# Patient Record
Sex: Female | Born: 1985
Health system: Southern US, Community
[De-identification: ages and names within clinical notes are randomized; demographics above are authoritative.]

## PROBLEM LIST (undated history)

## (undated) ENCOUNTER — Inpatient Hospital Stay: Payer: Self-pay

## (undated) DIAGNOSIS — D649 Anemia, unspecified: Secondary | ICD-10-CM

## (undated) DIAGNOSIS — R51 Headache: Secondary | ICD-10-CM

## (undated) DIAGNOSIS — R519 Headache, unspecified: Secondary | ICD-10-CM

## (undated) DIAGNOSIS — R569 Unspecified convulsions: Secondary | ICD-10-CM

## (undated) HISTORY — PX: HERNIA REPAIR: SHX51

---
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
## 2007-11-25 ENCOUNTER — Observation Stay: Payer: Self-pay

## 2007-11-27 ENCOUNTER — Inpatient Hospital Stay: Payer: Self-pay

## 2008-05-09 ENCOUNTER — Emergency Department: Payer: Self-pay | Admitting: Emergency Medicine

## 2009-06-09 ENCOUNTER — Emergency Department: Payer: Self-pay | Admitting: Emergency Medicine

## 2010-05-06 ENCOUNTER — Emergency Department: Payer: Self-pay | Admitting: Emergency Medicine

## 2010-12-05 ENCOUNTER — Emergency Department: Payer: Self-pay | Admitting: Emergency Medicine

## 2011-09-30 ENCOUNTER — Emergency Department: Payer: Self-pay | Admitting: Emergency Medicine

## 2011-10-01 LAB — CBC
HCT: 36.2 % (ref 35.0–47.0)
HGB: 12.1 g/dL (ref 12.0–16.0)
MCH: 28.3 pg (ref 26.0–34.0)
MCHC: 33.3 g/dL (ref 32.0–36.0)
MCV: 85 fL (ref 80–100)
Platelet: 251 10*3/uL (ref 150–440)
RBC: 4.27 10*6/uL (ref 3.80–5.20)
RDW: 14.1 % (ref 11.5–14.5)
WBC: 6.3 10*3/uL (ref 3.6–11.0)

## 2011-10-01 LAB — COMPREHENSIVE METABOLIC PANEL
Albumin: 3.9 g/dL (ref 3.4–5.0)
Alkaline Phosphatase: 43 U/L — ABNORMAL LOW (ref 50–136)
Anion Gap: 14 (ref 7–16)
BUN: 11 mg/dL (ref 7–18)
Bilirubin,Total: 0.2 mg/dL (ref 0.2–1.0)
Calcium, Total: 8.6 mg/dL (ref 8.5–10.1)
Chloride: 106 mmol/L (ref 98–107)
Co2: 26 mmol/L (ref 21–32)
Creatinine: 0.77 mg/dL (ref 0.60–1.30)
EGFR (African American): >60
EGFR (Non-African Amer.): >60
Glucose: 102 mg/dL — ABNORMAL HIGH (ref 65–99)
Osmolality: 290 (ref 275–301)
Potassium: 3.9 mmol/L (ref 3.5–5.1)
SGOT(AST): 18 U/L (ref 15–37)
SGPT (ALT): 16 U/L
Sodium: 146 mmol/L — ABNORMAL HIGH (ref 136–145)
Total Protein: 7.4 g/dL (ref 6.4–8.2)

## 2011-10-01 LAB — HCG, QUANTITATIVE, PREGNANCY: Beta Hcg, Quant.: 473 m[IU]/mL — ABNORMAL HIGH

## 2011-10-01 LAB — WET PREP, GENITAL

## 2011-10-02 ENCOUNTER — Emergency Department: Payer: Self-pay | Admitting: Emergency Medicine

## 2011-10-02 LAB — CBC WITH DIFFERENTIAL/PLATELET
Basophil #: 0 10*3/uL (ref 0.0–0.1)
Basophil %: 0.1 %
Eosinophil #: 0 10*3/uL (ref 0.0–0.7)
Eosinophil %: 0 %
HCT: 36.5 % (ref 35.0–47.0)
HGB: 12.1 g/dL (ref 12.0–16.0)
Lymphocyte #: 0.7 10*3/uL — ABNORMAL LOW (ref 1.0–3.6)
Lymphocyte %: 6.5 %
MCH: 28.3 pg (ref 26.0–34.0)
MCHC: 33.1 g/dL (ref 32.0–36.0)
MCV: 85 fL (ref 80–100)
Monocyte #: 0.1 10*3/uL (ref 0.0–0.7)
Monocyte %: 0.8 %
Neutrophil #: 10.2 10*3/uL — ABNORMAL HIGH (ref 1.4–6.5)
Neutrophil %: 92.6 %
Platelet: 252 10*3/uL (ref 150–440)
RBC: 4.27 10*6/uL (ref 3.80–5.20)
RDW: 14.1 % (ref 11.5–14.5)
WBC: 11 10*3/uL (ref 3.6–11.0)

## 2011-10-02 LAB — HCG, QUANTITATIVE, PREGNANCY: Beta Hcg, Quant.: 260 m[IU]/mL — ABNORMAL HIGH

## 2011-10-20 ENCOUNTER — Emergency Department: Payer: Self-pay | Admitting: Emergency Medicine

## 2011-11-23 ENCOUNTER — Emergency Department: Payer: Self-pay | Admitting: Emergency Medicine

## 2011-11-23 LAB — COMPREHENSIVE METABOLIC PANEL
Albumin: 3.7 g/dL (ref 3.4–5.0)
Alkaline Phosphatase: 51 U/L (ref 50–136)
Anion Gap: 10 (ref 7–16)
BUN: 9 mg/dL (ref 7–18)
Bilirubin,Total: 0.3 mg/dL (ref 0.2–1.0)
Calcium, Total: 8.9 mg/dL (ref 8.5–10.1)
Chloride: 105 mmol/L (ref 98–107)
Co2: 27 mmol/L (ref 21–32)
Creatinine: 0.77 mg/dL (ref 0.60–1.30)
EGFR (African American): >60
EGFR (Non-African Amer.): >60
Glucose: 81 mg/dL (ref 65–99)
Osmolality: 281 (ref 275–301)
Potassium: 4.3 mmol/L (ref 3.5–5.1)
SGOT(AST): 17 U/L (ref 15–37)
SGPT (ALT): 13 U/L
Sodium: 142 mmol/L (ref 136–145)
Total Protein: 7.2 g/dL (ref 6.4–8.2)

## 2011-11-23 LAB — CBC WITH DIFFERENTIAL/PLATELET
Basophil #: 0 10*3/uL (ref 0.0–0.1)
Basophil %: 0.5 %
Eosinophil #: 0.2 10*3/uL (ref 0.0–0.7)
Eosinophil %: 3.4 %
HCT: 34.1 % — ABNORMAL LOW (ref 35.0–47.0)
HGB: 11.4 g/dL — ABNORMAL LOW (ref 12.0–16.0)
Lymphocyte #: 2.1 10*3/uL (ref 1.0–3.6)
Lymphocyte %: 33.3 %
MCH: 28.4 pg (ref 26.0–34.0)
MCHC: 33.3 g/dL (ref 32.0–36.0)
MCV: 85 fL (ref 80–100)
Monocyte #: 0.4 10*3/uL (ref 0.0–0.7)
Monocyte %: 6.8 %
Neutrophil #: 3.5 10*3/uL (ref 1.4–6.5)
Neutrophil %: 56 %
Platelet: 252 10*3/uL (ref 150–440)
RBC: 4 10*6/uL (ref 3.80–5.20)
RDW: 14.2 % (ref 11.5–14.5)
WBC: 6.3 10*3/uL (ref 3.6–11.0)

## 2011-11-23 LAB — PREGNANCY, URINE: Pregnancy Test, Urine: NEGATIVE m[IU]/mL

## 2012-01-13 ENCOUNTER — Emergency Department: Payer: Self-pay | Admitting: Emergency Medicine

## 2012-01-22 ENCOUNTER — Emergency Department: Payer: Self-pay | Admitting: Emergency Medicine

## 2012-01-24 ENCOUNTER — Emergency Department: Payer: Self-pay | Admitting: Emergency Medicine

## 2012-04-10 ENCOUNTER — Emergency Department: Payer: Self-pay | Admitting: Emergency Medicine

## 2012-04-10 LAB — COMPREHENSIVE METABOLIC PANEL
Albumin: 3.3 g/dL — ABNORMAL LOW (ref 3.4–5.0)
Alkaline Phosphatase: 44 U/L — ABNORMAL LOW (ref 50–136)
Anion Gap: 7 (ref 7–16)
BUN: 7 mg/dL (ref 7–18)
Bilirubin,Total: 0.2 mg/dL (ref 0.2–1.0)
Calcium, Total: 9 mg/dL (ref 8.5–10.1)
Chloride: 105 mmol/L (ref 98–107)
Co2: 27 mmol/L (ref 21–32)
Creatinine: 0.69 mg/dL (ref 0.60–1.30)
EGFR (African American): >60
EGFR (Non-African Amer.): >60
Glucose: 86 mg/dL (ref 65–99)
Osmolality: 275 (ref 275–301)
Potassium: 3.8 mmol/L (ref 3.5–5.1)
SGOT(AST): 20 U/L (ref 15–37)
SGPT (ALT): 15 U/L
Sodium: 139 mmol/L (ref 136–145)
Total Protein: 6.9 g/dL (ref 6.4–8.2)

## 2012-04-10 LAB — URINALYSIS, COMPLETE
Bilirubin,UR: NEGATIVE
Blood: NEGATIVE
Glucose,UR: NEGATIVE mg/dL (ref 0–75)
Ketone: NEGATIVE
Leukocyte Esterase: NEGATIVE
Nitrite: NEGATIVE
Ph: 6 (ref 4.5–8.0)
Protein: NEGATIVE
RBC,UR: 1 /HPF (ref 0–5)
Specific Gravity: 1.011 (ref 1.003–1.030)
Squamous Epithelial: 4
WBC UR: 3 /HPF (ref 0–5)

## 2012-04-10 LAB — LIPASE, BLOOD: Lipase: 187 U/L (ref 73–393)

## 2012-04-10 LAB — CBC
HCT: 34.6 % — ABNORMAL LOW (ref 35.0–47.0)
HGB: 11.7 g/dL — ABNORMAL LOW (ref 12.0–16.0)
MCH: 28.9 pg (ref 26.0–34.0)
MCHC: 33.9 g/dL (ref 32.0–36.0)
MCV: 86 fL (ref 80–100)
Platelet: 235 10*3/uL (ref 150–440)
RBC: 4.04 10*6/uL (ref 3.80–5.20)
RDW: 14.9 % — ABNORMAL HIGH (ref 11.5–14.5)
WBC: 8.7 10*3/uL (ref 3.6–11.0)

## 2012-04-10 LAB — HCG, QUANTITATIVE, PREGNANCY: Beta Hcg, Quant.: 103697 m[IU]/mL — ABNORMAL HIGH

## 2012-04-10 LAB — WET PREP, GENITAL

## 2012-04-10 LAB — PREGNANCY, URINE: Pregnancy Test, Urine: POSITIVE m[IU]/mL

## 2012-04-12 LAB — URINE CULTURE

## 2012-09-20 ENCOUNTER — Observation Stay: Payer: Self-pay | Admitting: Obstetrics and Gynecology

## 2012-09-20 LAB — URINALYSIS, COMPLETE
Bilirubin,UR: NEGATIVE
Blood: NEGATIVE
Glucose,UR: 50 mg/dL (ref 0–75)
Nitrite: NEGATIVE
Ph: 7 (ref 4.5–8.0)
Protein: NEGATIVE
RBC,UR: 7 /HPF (ref 0–5)
Specific Gravity: 1.011 (ref 1.003–1.030)
Squamous Epithelial: 42
WBC UR: 68 /HPF (ref 0–5)

## 2012-09-22 LAB — URINE CULTURE

## 2012-10-09 ENCOUNTER — Observation Stay: Payer: Self-pay

## 2012-10-19 ENCOUNTER — Ambulatory Visit: Payer: Self-pay | Admitting: Obstetrics and Gynecology

## 2012-10-19 ENCOUNTER — Observation Stay: Payer: Self-pay | Admitting: Obstetrics and Gynecology

## 2012-10-19 LAB — URINALYSIS, COMPLETE
Bilirubin,UR: NEGATIVE
Glucose,UR: NEGATIVE mg/dL (ref 0–75)
Ketone: NEGATIVE
Nitrite: NEGATIVE
Ph: 7 (ref 4.5–8.0)
Protein: NEGATIVE
RBC,UR: 4 /HPF (ref 0–5)
Specific Gravity: 1.003 (ref 1.003–1.030)
Squamous Epithelial: 5
WBC UR: 20 /HPF (ref 0–5)

## 2012-10-19 LAB — CBC WITH DIFFERENTIAL/PLATELET
Basophil #: 0 10*3/uL (ref 0.0–0.1)
Basophil %: 0.3 %
Eosinophil #: 0 10*3/uL (ref 0.0–0.7)
Eosinophil %: 0.7 %
HCT: 38.1 % (ref 35.0–47.0)
HGB: 12.7 g/dL (ref 12.0–16.0)
Lymphocyte #: 1.7 10*3/uL (ref 1.0–3.6)
Lymphocyte %: 27.9 %
MCH: 28.4 pg (ref 26.0–34.0)
MCHC: 33.2 g/dL (ref 32.0–36.0)
MCV: 86 fL (ref 80–100)
Monocyte #: 0.5 x10 3/mm (ref 0.2–0.9)
Monocyte %: 8.1 %
Neutrophil #: 3.9 10*3/uL (ref 1.4–6.5)
Neutrophil %: 63 %
Platelet: 210 10*3/uL (ref 150–440)
RBC: 4.46 10*6/uL (ref 3.80–5.20)
RDW: 14.5 % (ref 11.5–14.5)
WBC: 6.2 10*3/uL (ref 3.6–11.0)

## 2012-10-19 LAB — BASIC METABOLIC PANEL
Anion Gap: 7 (ref 7–16)
BUN: 4 mg/dL — ABNORMAL LOW (ref 7–18)
Calcium, Total: 8.9 mg/dL (ref 8.5–10.1)
Chloride: 108 mmol/L — ABNORMAL HIGH (ref 98–107)
Co2: 24 mmol/L (ref 21–32)
Creatinine: 0.62 mg/dL (ref 0.60–1.30)
EGFR (African American): >60
EGFR (Non-African Amer.): >60
Glucose: 73 mg/dL (ref 65–99)
Osmolality: 273 (ref 275–301)
Potassium: 3.9 mmol/L (ref 3.5–5.1)
Sodium: 139 mmol/L (ref 136–145)

## 2012-10-19 LAB — CK: CK, Total: 100 U/L (ref 21–215)

## 2012-10-19 LAB — HEPATIC FUNCTION PANEL A (ARMC)
Albumin: 2.8 g/dL — ABNORMAL LOW (ref 3.4–5.0)
Alkaline Phosphatase: 235 U/L — ABNORMAL HIGH (ref 50–136)
Bilirubin, Direct: <0.1 mg/dL (ref 0.00–0.20)
Bilirubin,Total: 0.2 mg/dL (ref 0.2–1.0)
SGOT(AST): 15 U/L (ref 15–37)
SGPT (ALT): 12 U/L (ref 12–78)
Total Protein: 6.8 g/dL (ref 6.4–8.2)

## 2012-10-19 LAB — TROPONIN I: Troponin-I: <0.02 ng/mL

## 2012-10-19 LAB — CK-MB: CK-MB: 0.6 ng/mL (ref 0.5–3.6)

## 2012-10-20 ENCOUNTER — Inpatient Hospital Stay: Payer: Self-pay | Admitting: Obstetrics and Gynecology

## 2012-10-20 LAB — URINALYSIS, COMPLETE
Bacteria: NONE SEEN
Bilirubin,UR: NEGATIVE
Blood: NEGATIVE
Glucose,UR: NEGATIVE mg/dL (ref 0–75)
Leukocyte Esterase: NEGATIVE
Nitrite: NEGATIVE
Ph: 6 (ref 4.5–8.0)
Protein: NEGATIVE
RBC,UR: 1 /HPF (ref 0–5)
Specific Gravity: 1.018 (ref 1.003–1.030)
Squamous Epithelial: <1
WBC UR: 4 /HPF (ref 0–5)

## 2012-10-21 LAB — HEMATOCRIT: HCT: 31.5 % — ABNORMAL LOW (ref 35.0–47.0)

## 2012-12-23 ENCOUNTER — Emergency Department: Payer: Self-pay | Admitting: Emergency Medicine

## 2012-12-23 LAB — COMPREHENSIVE METABOLIC PANEL
Albumin: 4 g/dL (ref 3.4–5.0)
Alkaline Phosphatase: 85 U/L (ref 50–136)
Anion Gap: 3 — ABNORMAL LOW (ref 7–16)
BUN: 8 mg/dL (ref 7–18)
Bilirubin,Total: 0.4 mg/dL (ref 0.2–1.0)
Calcium, Total: 9 mg/dL (ref 8.5–10.1)
Chloride: 108 mmol/L — ABNORMAL HIGH (ref 98–107)
Co2: 29 mmol/L (ref 21–32)
Creatinine: 0.9 mg/dL (ref 0.60–1.30)
EGFR (African American): >60
EGFR (Non-African Amer.): >60
Glucose: 85 mg/dL (ref 65–99)
Osmolality: 277 (ref 275–301)
Potassium: 4 mmol/L (ref 3.5–5.1)
SGOT(AST): 31 U/L (ref 15–37)
SGPT (ALT): 30 U/L (ref 12–78)
Sodium: 140 mmol/L (ref 136–145)
Total Protein: 7.5 g/dL (ref 6.4–8.2)

## 2012-12-23 LAB — CBC
HCT: 38.2 % (ref 35.0–47.0)
HGB: 12.1 g/dL (ref 12.0–16.0)
MCH: 26.9 pg (ref 26.0–34.0)
MCHC: 31.6 g/dL — ABNORMAL LOW (ref 32.0–36.0)
MCV: 85 fL (ref 80–100)
Platelet: 285 10*3/uL (ref 150–440)
RBC: 4.47 10*6/uL (ref 3.80–5.20)
RDW: 15.5 % — ABNORMAL HIGH (ref 11.5–14.5)
WBC: 7.6 10*3/uL (ref 3.6–11.0)

## 2013-08-30 ENCOUNTER — Emergency Department: Payer: Self-pay | Admitting: Emergency Medicine

## 2013-08-30 LAB — URINALYSIS, COMPLETE
Bacteria: NONE SEEN
Bilirubin,UR: NEGATIVE
Blood: NEGATIVE
Glucose,UR: NEGATIVE mg/dL (ref 0–75)
Leukocyte Esterase: NEGATIVE
Nitrite: NEGATIVE
Ph: 6 (ref 4.5–8.0)
Protein: NEGATIVE
RBC,UR: <1 /HPF (ref 0–5)
Specific Gravity: 1.004 (ref 1.003–1.030)
Squamous Epithelial: 2
WBC UR: 2 /HPF (ref 0–5)

## 2013-08-30 LAB — CBC WITH DIFFERENTIAL/PLATELET
Basophil #: 0.1 10*3/uL (ref 0.0–0.1)
Basophil %: 1.1 %
Eosinophil #: 0.1 10*3/uL (ref 0.0–0.7)
Eosinophil %: 1.2 %
HCT: 38.3 % (ref 35.0–47.0)
HGB: 12.7 g/dL (ref 12.0–16.0)
Lymphocyte #: 1.8 10*3/uL (ref 1.0–3.6)
Lymphocyte %: 28.6 %
MCH: 28.6 pg (ref 26.0–34.0)
MCHC: 33.2 g/dL (ref 32.0–36.0)
MCV: 86 fL (ref 80–100)
Monocyte #: 0.4 x10 3/mm (ref 0.2–0.9)
Monocyte %: 6.1 %
Neutrophil #: 4 10*3/uL (ref 1.4–6.5)
Neutrophil %: 63 %
Platelet: 270 10*3/uL (ref 150–440)
RBC: 4.44 10*6/uL (ref 3.80–5.20)
RDW: 14.2 % (ref 11.5–14.5)
WBC: 6.3 10*3/uL (ref 3.6–11.0)

## 2013-08-30 LAB — COMPREHENSIVE METABOLIC PANEL
Albumin: 4.2 g/dL (ref 3.4–5.0)
Alkaline Phosphatase: 69 U/L
Anion Gap: 5 — ABNORMAL LOW (ref 7–16)
BUN: 6 mg/dL — ABNORMAL LOW (ref 7–18)
Bilirubin,Total: 0.4 mg/dL (ref 0.2–1.0)
Calcium, Total: 9.8 mg/dL (ref 8.5–10.1)
Chloride: 105 mmol/L (ref 98–107)
Co2: 29 mmol/L (ref 21–32)
Creatinine: 0.95 mg/dL (ref 0.60–1.30)
EGFR (African American): >60
EGFR (Non-African Amer.): >60
Glucose: 104 mg/dL — ABNORMAL HIGH (ref 65–99)
Osmolality: 275 (ref 275–301)
Potassium: 4.2 mmol/L (ref 3.5–5.1)
SGOT(AST): 11 U/L — ABNORMAL LOW (ref 15–37)
SGPT (ALT): 15 U/L (ref 12–78)
Sodium: 139 mmol/L (ref 136–145)
Total Protein: 7.5 g/dL (ref 6.4–8.2)

## 2013-08-30 LAB — PREGNANCY, URINE: Pregnancy Test, Urine: NEGATIVE m[IU]/mL

## 2013-10-26 ENCOUNTER — Emergency Department: Payer: Self-pay | Admitting: Emergency Medicine

## 2013-10-26 LAB — CBC
HCT: 36 % (ref 35.0–47.0)
HGB: 12.2 g/dL (ref 12.0–16.0)
MCH: 29.8 pg (ref 26.0–34.0)
MCHC: 33.9 g/dL (ref 32.0–36.0)
MCV: 88 fL (ref 80–100)
Platelet: 235 10*3/uL (ref 150–440)
RBC: 4.09 10*6/uL (ref 3.80–5.20)
RDW: 13.7 % (ref 11.5–14.5)
WBC: 6.8 10*3/uL (ref 3.6–11.0)

## 2013-10-26 LAB — URINALYSIS, COMPLETE
Bacteria: NONE SEEN
Bilirubin,UR: NEGATIVE
Blood: NEGATIVE
Glucose,UR: NEGATIVE mg/dL (ref 0–75)
Ketone: NEGATIVE
Leukocyte Esterase: NEGATIVE
Nitrite: NEGATIVE
Ph: 6 (ref 4.5–8.0)
Protein: NEGATIVE
RBC,UR: 1 /HPF (ref 0–5)
Specific Gravity: 1.021 (ref 1.003–1.030)
Squamous Epithelial: 3
WBC UR: 2 /HPF (ref 0–5)

## 2013-10-26 LAB — COMPREHENSIVE METABOLIC PANEL
Albumin: 3.6 g/dL (ref 3.4–5.0)
Alkaline Phosphatase: 50 U/L
Anion Gap: 6 — ABNORMAL LOW (ref 7–16)
BUN: 8 mg/dL (ref 7–18)
Bilirubin,Total: 0.3 mg/dL (ref 0.2–1.0)
Calcium, Total: 9.3 mg/dL (ref 8.5–10.1)
Chloride: 104 mmol/L (ref 98–107)
Co2: 27 mmol/L (ref 21–32)
Creatinine: 0.65 mg/dL (ref 0.60–1.30)
EGFR (African American): >60
EGFR (Non-African Amer.): >60
Glucose: 80 mg/dL (ref 65–99)
Osmolality: 271 (ref 275–301)
Potassium: 3.7 mmol/L (ref 3.5–5.1)
SGOT(AST): 14 U/L — ABNORMAL LOW (ref 15–37)
SGPT (ALT): 23 U/L (ref 12–78)
Sodium: 137 mmol/L (ref 136–145)
Total Protein: 7.1 g/dL (ref 6.4–8.2)

## 2013-10-26 LAB — HCG, QUANTITATIVE, PREGNANCY: Beta Hcg, Quant.: 105917 m[IU]/mL — ABNORMAL HIGH

## 2013-10-26 LAB — WET PREP, GENITAL

## 2013-10-27 LAB — GC/CHLAMYDIA PROBE AMP

## 2013-10-28 LAB — URINE CULTURE

## 2013-11-16 ENCOUNTER — Ambulatory Visit: Payer: Self-pay | Admitting: Advanced Practice Midwife

## 2014-01-11 ENCOUNTER — Observation Stay: Payer: Self-pay | Admitting: Obstetrics and Gynecology

## 2014-01-11 LAB — CBC WITH DIFFERENTIAL/PLATELET
Basophil #: 0 10*3/uL (ref 0.0–0.1)
Basophil %: 0.6 %
Eosinophil #: 0 10*3/uL (ref 0.0–0.7)
Eosinophil %: 0.3 %
HCT: 31.8 % — ABNORMAL LOW (ref 35.0–47.0)
HGB: 10.2 g/dL — ABNORMAL LOW (ref 12.0–16.0)
Lymphocyte #: 1.9 10*3/uL (ref 1.0–3.6)
Lymphocyte %: 25.2 %
MCH: 27.6 pg (ref 26.0–34.0)
MCHC: 32.2 g/dL (ref 32.0–36.0)
MCV: 86 fL (ref 80–100)
Monocyte #: 0.5 x10 3/mm (ref 0.2–0.9)
Monocyte %: 7 %
Neutrophil #: 5.1 10*3/uL (ref 1.4–6.5)
Neutrophil %: 66.9 %
Platelet: 243 10*3/uL (ref 150–440)
RBC: 3.7 10*6/uL — ABNORMAL LOW (ref 3.80–5.20)
RDW: 13.3 % (ref 11.5–14.5)
WBC: 7.6 10*3/uL (ref 3.6–11.0)

## 2014-01-11 LAB — URINALYSIS, COMPLETE
Bilirubin,UR: NEGATIVE
Glucose,UR: NEGATIVE mg/dL (ref 0–75)
Nitrite: NEGATIVE
Ph: 8 (ref 4.5–8.0)
Protein: NEGATIVE
RBC,UR: 2 /HPF (ref 0–5)
Specific Gravity: 1.004 (ref 1.003–1.030)
Squamous Epithelial: 4
WBC UR: 61 /HPF (ref 0–5)

## 2014-03-15 ENCOUNTER — Inpatient Hospital Stay: Payer: Self-pay | Admitting: Obstetrics and Gynecology

## 2014-03-15 LAB — URINALYSIS, COMPLETE
Bilirubin,UR: NEGATIVE
Blood: NEGATIVE
Glucose,UR: NEGATIVE mg/dL (ref 0–75)
Ketone: NEGATIVE
Nitrite: POSITIVE
Ph: 6 (ref 4.5–8.0)
Protein: 100
Specific Gravity: 1.012 (ref 1.003–1.030)

## 2014-03-16 LAB — DRUG SCREEN, URINE
Amphetamines, Ur Screen: NEGATIVE (ref ?–1000)
Barbiturates, Ur Screen: NEGATIVE (ref ?–200)
Benzodiazepine, Ur Scrn: NEGATIVE (ref ?–200)
Cannabinoid 50 Ng, Ur ~~LOC~~: POSITIVE (ref ?–50)
Cocaine Metabolite,Ur ~~LOC~~: NEGATIVE (ref ?–300)
MDMA (Ecstasy)Ur Screen: NEGATIVE (ref ?–500)
Methadone, Ur Screen: NEGATIVE (ref ?–300)
Opiate, Ur Screen: NEGATIVE (ref ?–300)
Phencyclidine (PCP) Ur S: NEGATIVE (ref ?–25)
Tricyclic, Ur Screen: NEGATIVE (ref ?–1000)

## 2014-03-16 LAB — CBC WITH DIFFERENTIAL/PLATELET
Basophil #: 0 10*3/uL (ref 0.0–0.1)
Basophil %: 0.2 %
Eosinophil #: 0 10*3/uL (ref 0.0–0.7)
Eosinophil %: 0.4 %
HCT: 26.8 % — ABNORMAL LOW (ref 35.0–47.0)
HGB: 8.8 g/dL — ABNORMAL LOW (ref 12.0–16.0)
Lymphocyte #: 0.8 10*3/uL — ABNORMAL LOW (ref 1.0–3.6)
Lymphocyte %: 6.6 %
MCH: 25.9 pg — ABNORMAL LOW (ref 26.0–34.0)
MCHC: 32.7 g/dL (ref 32.0–36.0)
MCV: 79 fL — ABNORMAL LOW (ref 80–100)
Monocyte #: 0.2 x10 3/mm (ref 0.2–0.9)
Monocyte %: 2 %
Neutrophil #: 10.4 10*3/uL — ABNORMAL HIGH (ref 1.4–6.5)
Neutrophil %: 90.8 %
Platelet: 206 10*3/uL (ref 150–440)
RBC: 3.38 10*6/uL — ABNORMAL LOW (ref 3.80–5.20)
RDW: 14.9 % — ABNORMAL HIGH (ref 11.5–14.5)
WBC: 11.4 10*3/uL — ABNORMAL HIGH (ref 3.6–11.0)

## 2014-03-17 LAB — BASIC METABOLIC PANEL
Anion Gap: 8 (ref 7–16)
BUN: 4 mg/dL — ABNORMAL LOW (ref 7–18)
Calcium, Total: 8.4 mg/dL — ABNORMAL LOW (ref 8.5–10.1)
Chloride: 108 mmol/L — ABNORMAL HIGH (ref 98–107)
Co2: 23 mmol/L (ref 21–32)
Creatinine: 0.77 mg/dL (ref 0.60–1.30)
EGFR (African American): >60
EGFR (Non-African Amer.): >60
Glucose: 124 mg/dL — ABNORMAL HIGH (ref 65–99)
Osmolality: 276 (ref 275–301)
Potassium: 3.6 mmol/L (ref 3.5–5.1)
Sodium: 139 mmol/L (ref 136–145)

## 2014-03-17 LAB — CBC WITH DIFFERENTIAL/PLATELET
Basophil #: 0 10*3/uL (ref 0.0–0.1)
Basophil %: 0.6 %
Eosinophil #: 0.1 10*3/uL (ref 0.0–0.7)
Eosinophil %: 1.1 %
HCT: 25.1 % — ABNORMAL LOW (ref 35.0–47.0)
HGB: 8.3 g/dL — ABNORMAL LOW (ref 12.0–16.0)
Lymphocyte #: 1.9 10*3/uL (ref 1.0–3.6)
Lymphocyte %: 23.1 %
MCH: 26.4 pg (ref 26.0–34.0)
MCHC: 33 g/dL (ref 32.0–36.0)
MCV: 80 fL (ref 80–100)
Monocyte #: 0.6 x10 3/mm (ref 0.2–0.9)
Monocyte %: 7.8 %
Neutrophil #: 5.5 10*3/uL (ref 1.4–6.5)
Neutrophil %: 67.4 %
Platelet: 185 10*3/uL (ref 150–440)
RBC: 3.13 10*6/uL — ABNORMAL LOW (ref 3.80–5.20)
RDW: 15.2 % — ABNORMAL HIGH (ref 11.5–14.5)
WBC: 8.2 10*3/uL (ref 3.6–11.0)

## 2014-03-18 LAB — URINE CULTURE

## 2014-03-21 LAB — CULTURE, BLOOD (SINGLE)

## 2014-04-10 ENCOUNTER — Observation Stay: Payer: Self-pay | Admitting: Obstetrics and Gynecology

## 2014-04-10 LAB — URINALYSIS, COMPLETE
Bacteria: NONE SEEN
Bilirubin,UR: NEGATIVE
Blood: NEGATIVE
Glucose,UR: NEGATIVE mg/dL (ref 0–75)
Nitrite: NEGATIVE
Ph: 7 (ref 4.5–8.0)
Protein: NEGATIVE
RBC,UR: 3 /HPF (ref 0–5)
Specific Gravity: 1.006 (ref 1.003–1.030)
Squamous Epithelial: 3
WBC UR: 14 /HPF (ref 0–5)

## 2014-04-20 ENCOUNTER — Observation Stay: Payer: Self-pay | Admitting: Obstetrics and Gynecology

## 2014-04-20 LAB — URINALYSIS, COMPLETE
Bilirubin,UR: NEGATIVE
Blood: NEGATIVE
Glucose,UR: NEGATIVE mg/dL (ref 0–75)
Ketone: NEGATIVE
Nitrite: NEGATIVE
Ph: 8 (ref 4.5–8.0)
Protein: NEGATIVE
RBC,UR: 2 /HPF (ref 0–5)
Specific Gravity: 1.004 (ref 1.003–1.030)
Squamous Epithelial: 6
WBC UR: 16 /HPF (ref 0–5)

## 2014-04-21 LAB — URINE CULTURE

## 2014-05-13 ENCOUNTER — Inpatient Hospital Stay: Payer: Self-pay | Admitting: Obstetrics and Gynecology

## 2014-05-13 LAB — CBC WITH DIFFERENTIAL/PLATELET
Basophil #: 0 10*3/uL (ref 0.0–0.1)
Basophil %: 0.7 %
Eosinophil #: 0 10*3/uL (ref 0.0–0.7)
Eosinophil %: 0.6 %
HCT: 32 % — ABNORMAL LOW (ref 35.0–47.0)
HGB: 10 g/dL — ABNORMAL LOW (ref 12.0–16.0)
Lymphocyte #: 1.5 10*3/uL (ref 1.0–3.6)
Lymphocyte %: 23.1 %
MCH: 26 pg (ref 26.0–34.0)
MCHC: 31.3 g/dL — ABNORMAL LOW (ref 32.0–36.0)
MCV: 83 fL (ref 80–100)
Monocyte #: 0.4 x10 3/mm (ref 0.2–0.9)
Monocyte %: 5.8 %
Neutrophil #: 4.5 10*3/uL (ref 1.4–6.5)
Neutrophil %: 69.8 %
Platelet: 206 10*3/uL (ref 150–440)
RBC: 3.85 10*6/uL (ref 3.80–5.20)
RDW: 22.4 % — ABNORMAL HIGH (ref 11.5–14.5)
WBC: 6.5 10*3/uL (ref 3.6–11.0)

## 2014-05-14 LAB — DRUG SCREEN, URINE

## 2014-05-14 LAB — CBC WITH DIFFERENTIAL/PLATELET
Basophil #: 0.1 10*3/uL (ref 0.0–0.1)
Basophil %: 0.8 %
Eosinophil #: 0.1 10*3/uL (ref 0.0–0.7)
Eosinophil %: 0.7 %
HCT: 36 % (ref 35.0–47.0)
HGB: 11.2 g/dL — ABNORMAL LOW (ref 12.0–16.0)
Lymphocyte #: 2.4 10*3/uL (ref 1.0–3.6)
Lymphocyte %: 31.4 %
MCH: 25.8 pg — ABNORMAL LOW (ref 26.0–34.0)
MCHC: 31 g/dL — ABNORMAL LOW (ref 32.0–36.0)
MCV: 83 fL (ref 80–100)
Monocyte #: 0.4 x10 3/mm (ref 0.2–0.9)
Monocyte %: 5.7 %
Neutrophil #: 4.7 10*3/uL (ref 1.4–6.5)
Neutrophil %: 61.4 %
Platelet: 215 10*3/uL (ref 150–440)
RBC: 4.33 10*6/uL (ref 3.80–5.20)
RDW: 22.5 % — ABNORMAL HIGH (ref 11.5–14.5)
WBC: 7.7 10*3/uL (ref 3.6–11.0)

## 2015-01-03 NOTE — Consult Note (Signed)
PATIENT NAME:  Jill [name], Jill [name] MR#:  [id number] DATE OF BIRTH:  02/17/1986  DATE OF CONSULTATION:  10/19/2012  REFERRING PHYSICIAN:  Dr. Andree Elk from anesthesia. CONSULTING PHYSICIAN:  Gladstone Lighter, MD  PRIMARY OBSTETRICS/GYNECOLOGIST:  Dr. (Dictation Anomaly) <<MISSING TEXT>>.  REASON FOR CONSULTATION:  Chest pain, difficulty breathing.   HISTORY OF PRESENT ILLNESS:  The patient is a 29 year old African American female who is G4 P2 L2 who is [redacted] weeks pregnant, was sent in by Dr. Andree Elk for chest pain and difficulty breathing.  The patient has been ambulating in the room without any distress.  Her sats are 100% on room air and she told me that she has been experiencing some difficulty breathing over the past 2 weeks.  She is usually active.  Does not have any medical problems.  No asthma and uncomplicated pregnancy so far.  She has not had any complications during her prior two pregnancies as well.  She is also having sharp chest pain in the left side of her chest and also on the breast which is tender to touch going on for the past two weeks.  Nothing that she notes has triggered.  The chest pain is not associated with her dyspnea.  The chest pain gets worse with deep breath and kind of radiates to her back on deep breath.  No prior cardiac history or no family history of heart disease.   PAST MEDICAL HISTORY:  History of group B streptococcus last year, otherwise none.   PAST SURGICAL HISTORY:  Prior C-section.   HOME MEDICATIONS:  Tylenol and prenatal vitamins.   ALLERGIES TO MEDICATIONS:  No known drug allergies.   SOCIAL HISTORY:  Lives at home by herself.  No smoking, alcohol or drug use.   FAMILY HISTORY:  Both parents are healthy.   REVIEW OF SYSTEMS:  CONSTITUTIONAL:  No fever, fatigue or weakness. EYES:  No blurred vision, double vision, glaucoma or cataracts.  EARS, NOSE, THROAT:  No tinnitus, ear pain, hearing loss, epistaxis or discharge.  RESPIRATORY:  Positive for  dyspnea on exertion.  No cough, wheeze, hemoptysis or COPD.  CARDIOVASCULAR:  Positive for pleuritic chest pain.  No orthopnea, edema, arrhythmia, palpitations or syncope.  GASTROINTESTINAL:  No nausea, vomiting, abdominal pain, hematemesis or melena.  GENITOURINARY:  No dysuria, hematuria, renal calculus, frequency or incontinence.  ENDOCRINE:  No polyuria, nocturia, thyroid problems, heat or cold intolerance.  HEMATOLOGY:  No anemia, easy bruising or bleeding.  SKIN:  No acne, rash or lesions.  MUSCULOSKELETAL:  No neck, back, shoulder pain, arthritis or gout.  NEUROLOGIC:  No numbness, weakness, CVA, TIA or seizures.  PSYCHOLOGIC:  No anxiety, insomnia, depression.   PHYSICAL EXAMINATION: VITAL SIGNS: Temperature 98.4 degrees Fahrenheit, pulse 100, blood pressure 113/70, respirations 20, pulse ox 100% on room air.  GENERAL:  Well-built, well-nourished female lying in bed, not in any acute distress.  HEENT:  Normocephalic, atraumatic.  Pupils equal, round, reacting to light.  Anicteric sclerae.  Extraocular movements intact.  Oropharynx clear without erythema, mass or exudates.  NECK:  Supple.  No thyromegaly, JVD or carotid bruits.  No lymphadenopathy.  LUNGS:  Moving air bilaterally.  No wheeze or crackles.  No use of accessory muscles for breathing.  CARDIOVASCULAR:  S1, S2, regular rate and rhythm.  No murmurs, rubs or gallops.  She is tender to touch along the costochondral junction and the lower chest on the left side and also the medial portion of her left breast.  Pain is worsened by  taking deep breath and shoots sharp through the back.  Pain does not change with position.  ABDOMEN:  She is full-term pregnant.  Baby movements present.  EXTREMITIES:  Mild ankle edema.  Normal dorsalis pedis pulses palpable bilaterally.  SKIN:  No acne, rash or lesions.  LYMPHATICS:  No cervical lymphadenopathy.  NEUROLOGIC:  Cranial nerves intact.  No focal motor or sensory deficits.  PSYCHOLOGICAL:   The patient is awake, alert, oriented x 3.   LABORATORY DATA:  WBC 6.2, hemoglobin 12.7, hematocrit 38.1, platelet count 210.  Sodium 139, potassium 3.9, chloride 108, bicarb 24, BUN 4, creatinine 0.62, glucose (Dictation Anomaly) <<MISSING TEXT>>, calcium of 8.9.  ALT 12, AST (Dictation Anomaly) <<MISSING TEXT>>, alk phos 235, total bili 0.2 and albumin of 2.8.  EKG showing normal sinus rhythm.  No acute ST-T wave abnormalities.   RECOMMENDATIONS:  This is a 29 year old female with no past medical history who is G4 [redacted] weeks pregnant.  Consult requested for chest pain and shortness of breath.  1.  Chest pain.  Her chest pain is left-sided, pleuritic in nature and very tender to touch.  Could be inflammatory muscle pain or pleuritis or pleurisy.  EKG does not show any acute changes so less likely to be cardiac.  Her vitals are stable.  No known cardiac history or family history so okay to proceed with C-section at this time.  If the chest pain along with the dyspnea persists after delivery, might be better to get CT chest to rule out PE.  2.  Dyspnea on exertion, probably related to her pregnancy.  Lungs are clear on exam and she is able to ambulate without any difficulty and sats are normal on room air.  If her dyspnea persists even after delivery, better to get a chest x-ray and probably a CT chest to rule out PE.  Even if she has suspicion, PE is less as no family history and she is very active, but even if she has it is probably a small one because no EKG changes, no changes in her blood pressure and her sats seem very stable at this time.    Please call if any questions or concerns.  Thank you for allowing me to participate in the care of the patient at this time.   TIME SPENT IN THIS CONSULTATION:  Is 55 minutes.       ____________________________ Gladstone Lighter, MD rk:ea D: 10/19/2012 22:25:46 ET T: 10/20/2012 00:44:00 ET JOB#: [id number]  cc: Gladstone Lighter, MD, <Dictator>

## 2015-01-03 NOTE — Op Note (Signed)
PATIENT NAME:  Jill [name], [name] MR#:  [id number] DATE OF BIRTH:  23-Aug-1986  DATE OF PROCEDURE:  10/20/2012  PREOPERATIVE DIAGNOSIS: Elective repeat cesarean section.  POSTOPERATIVE DIAGNOSIS: Elective repeat cesarean section.  PROCEDURES PERFORMED: 1. Repeat low transverse cesarean section.  2. On-Q pump placement.   ANESTHESIA: Spinal.   SURGEON: Suzy Bouchardhomas J. Curvin Hunger, MD   FIRST ASSISTANT: Strate - Scrub Tech  INDICATION: This is a 29 year old gravida 4, para 2, [facility] The patient elects for repeat cesarean section. The patient has had two prior cesarean sections.   DESCRIPTION OF PROCEDURE: After adequate spinal anesthesia, the patient was placed in the dorsal supine position with a hip roll under the right side. The patient's abdomen was prepped and draped in normal sterile fashion. The patient did receive 2 grams IV Ancef prior to commencement. A Pfannenstiel incision was made two fingerbreadths above the symphysis pubis. Sharp dissection was used to identify the fascia. The fascia was opened in the midline, opened in a transverse fashion. The superior aspect of the fascia was grasped with Kocher clamps and the recti muscles dissected free. The inferior aspect of the fascia was grasped with Kocher clamps and pyramidalis muscle was dissected free. Entry into the peritoneal cavity was accomplished sharply. The vesicouterine peritoneal fold was identified. Bladder was reflected inferiorly. A low transverse uterine incision was made. Upon entry into the endometrial cavity yellowish meconium-like fluid resulted. The incision was extended with blunt transverse traction. Vacuum was applied to the fetal occiput. With one gentle pull of the vacuum, the fetal head was delivered. The vacuum was removed. Delivery of the shoulders and body was accomplished without difficulty. A vigorous female took a spontaneous respiration on the patient's abdomen while the cord was being  clamped. Oral nasopharynx suctioned and the infant was passed to Dr. Beckie Saltsasnadi who assigned Apgar scores of 9 and 9. 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 Please reference Dr. Beckie Saltsasnadi notes. Fetal weight 2940 grams. The placenta was then manually delivered, intravenous Pitocin was administered. The uterus was exteriorized and wiped clean with laparotomy tape and the cervix was opened with a ring forceps and this was passed off the operative field. The uterine incision was closed with one chromic suture in a running locking fashion. Several figure-of-eight sutures were required for hemostasis. Fallopian tubes and ovaries appeared normal. The posterior cul-de-sac was irrigated and suctioned. The uterus was placed back into the abdominal cavity and the paracolic gutters were wiped clean with laparotomy tape. Uterine incision again appeared hemostatic. Interceed was placed over the lower uterine incision, in a T-shaped fashion. The superior aspect of the fascia was grasped with Kocher clamps and the On-Q pump catheters were placed subfascially. The fascia was closed over top of these catheters with 0 Vicryl suture. Subcutaneous tissues were irrigated and bovied for hemostasis. The skin was reapproximated with the Insorb absorbable sutures and the On-Q pump catheters were secured at the skin level with Dermabond and Steri-Stripped to the skin and each catheter was then loaded with 5 mL of 0.5% Marcaine. Estimated blood loss 600 mL. Intraoperative fluids 700 mL. The patient tolerated the procedure well and was taken to the recovery room in good condition.  ____________________________ Suzy Bouchardhomas J. Shia Delaine, MD tjs:sb D: 10/20/2012 08:36:36 ET T: 10/20/2012 10:27:57 ET JOB#: [id number]  cc: Suzy Bouchardhomas J. Shanah Guimaraes, MD, <Dictator> Suzy BouchardHOMAS J Benjiman Sedgwick MD ELECTRONICALLY SIGNED 10/23/2012 9:04

## 2015-01-03 NOTE — Consult Note (Signed)
PATIENT NAME:  Jill [name], Jill [name] MR#:  [id number] DATE OF BIRTH:  [birth year]  DATE OF CONSULTATION:  10/19/2012  REFERRING PHYSICIAN:  Dr. Andree Elk from anesthesia. CONSULTING PHYSICIAN:  Gladstone Lighter, MD  PRIMARY OBSTETRICS/GYNECOLOGIST:  Dr. Anda Latina.  REASON FOR CONSULTATION:  Chest pain, difficulty breathing.   HISTORY OF PRESENT ILLNESS:  The patient is a 29 year old African American female who is G4 P2 L2 who is [redacted] weeks pregnant, was sent in by Dr. Andree Elk for chest pain and difficulty breathing.  The patient has been ambulating in the room without any distress.  Her sats are 100% on room air and she told me that she has been experiencing some difficulty breathing over the past 2 weeks.  She is usually active.  Does not have any medical problems.  No asthma and uncomplicated pregnancy so far.  She has not had any complications during her prior two pregnancies as well.  She is also having sharp chest pain in the left side of her chest and also on the breast which is tender to touch going on for the past two weeks.  Nothing that she notes has triggered.  The chest pain is not associated with her dyspnea.  The chest pain gets worse with deep breath and kind of radiates to her back on deep breath.  No prior cardiac history or no family history of heart disease.   PAST MEDICAL HISTORY:  History of group B streptococcus last year, otherwise none.   PAST SURGICAL HISTORY:  Prior C-section.   HOME MEDICATIONS:  Tylenol and prenatal vitamins.   ALLERGIES TO MEDICATIONS:  No known drug allergies.   SOCIAL HISTORY:  Lives at home by herself.  No smoking, alcohol or drug use.   FAMILY HISTORY:  Both parents are healthy.   REVIEW OF SYSTEMS:  CONSTITUTIONAL:  No fever, fatigue or weakness. EYES:  No blurred vision, double vision, glaucoma or cataracts.  EARS, NOSE, THROAT:  No tinnitus, ear pain, hearing loss, epistaxis or discharge.  RESPIRATORY:  Positive for dyspnea on exertion.   No cough, wheeze, hemoptysis or COPD.  CARDIOVASCULAR:  Positive for pleuritic chest pain.  No orthopnea, edema, arrhythmia, palpitations or syncope.  GASTROINTESTINAL:  No nausea, vomiting, abdominal pain, hematemesis or melena.  GENITOURINARY:  No dysuria, hematuria, renal calculus, frequency or incontinence.  ENDOCRINE:  No polyuria, nocturia, thyroid problems, heat or cold intolerance.  HEMATOLOGY:  No anemia, easy bruising or bleeding.  SKIN:  No acne, rash or lesions.  MUSCULOSKELETAL:  No neck, back, shoulder pain, arthritis or gout.  NEUROLOGIC:  No numbness, weakness, CVA, TIA or seizures.  PSYCHOLOGIC:  No anxiety, insomnia, depression.   PHYSICAL EXAMINATION: VITAL SIGNS: Temperature 98.4 degrees Fahrenheit, pulse 100, blood pressure 113/70, respirations 20, pulse ox 100% on room air.  GENERAL:  Well-built, well-nourished female lying in bed, not in any acute distress.  HEENT:  Normocephalic, atraumatic.  Pupils equal, round, reacting to light.  Anicteric sclerae.  Extraocular movements intact.  Oropharynx clear without erythema, mass or exudates.  NECK:  Supple.  No thyromegaly, JVD or carotid bruits.  No lymphadenopathy.  LUNGS:  Moving air bilaterally.  No wheeze or crackles.  No use of accessory muscles for breathing.  CARDIOVASCULAR:  S1, S2, regular rate and rhythm.  No murmurs, rubs or gallops.  She is tender to touch along the costochondral junction and the lower chest on the left side and also the medial portion of her left breast.  Pain is worsened by taking deep  breath and shoots sharp through the back.  Pain does not change with position.  ABDOMEN:  She is full-term pregnant.  Baby movements present.  EXTREMITIES:  Mild ankle edema.  Normal dorsalis pedis pulses palpable bilaterally.  SKIN:  No acne, rash or lesions.  LYMPHATICS:  No cervical lymphadenopathy.  NEUROLOGIC:  Cranial nerves intact.  No focal motor or sensory deficits.  PSYCHOLOGICAL:  The patient is awake,  alert, oriented x 3.   LABORATORY DATA:  WBC 6.2, hemoglobin 12.7, hematocrit 38.1, platelet count 210.  Sodium 139, potassium 3.9, chloride 108, bicarb 24, BUN 4, creatinine 0.62, glucose 73, calcium of 8.9.  ALT 12, AST 15, alk phos 235, total bili 0.2 and albumin of 2.8.  EKG showing normal sinus rhythm.  No acute ST-T wave abnormalities.   RECOMMENDATIONS:  This is a 29 year old female with no past medical history who is G4 [redacted] weeks pregnant.  Consult requested for chest pain and shortness of breath.  1.  Chest pain.  Her chest pain is left-sided, pleuritic in nature and very tender to touch.  Could be inflammatory muscle pain or pleuritis or pleurisy.  EKG does not show any acute changes so less likely to be cardiac.  Her vitals are stable.  No known cardiac history or family history so okay to proceed with C-section at this time.  If the chest pain along with the dyspnea persists after delivery, might be better to get CT chest to rule out PE.  2.  Dyspnea on exertion, probably related to her pregnancy.  Lungs are clear on exam and she is able to ambulate without any difficulty and sats are normal on room air.  If her dyspnea persists even after delivery, better to get a chest x-ray and probably a CT chest to rule out PE.  Even if she has suspicion, PE is less as no family history and she is very active, but even if she has it is probably a small one because no EKG changes, no changes in her blood pressure and her sats seem very stable at this time.    Please call if any questions or concerns.  Thank you for allowing me to participate in the care of the patient at this time.   TIME SPENT IN THIS CONSULTATION:  Is 55 minutes.     ____________________________ Gladstone Lighter, MD rk:ea D: 10/19/2012 22:25:00 ET T: 10/20/2012 00:44:00 ET JOB#: [id number]  cc: Gladstone Lighter, MD, <Dictator> Gladstone Lighter MD ELECTRONICALLY SIGNED 10/31/2012 22:20

## 2015-01-03 NOTE — Consult Note (Signed)
Brief Consult Note: Diagnosis: chest pain, [redacted]weeks pregnant.   Patient was seen by consultant.   Consult note dictated.   Recommend to proceed with surgery or procedure.   Orders entered.   Discussed with Attending MD.   Comments: 29y/o F with no PMH who is G3P2L2 - consulted requested for chest pain and SOB  * Chest pain- pleuritic chest pain left breast and costochondral region- tender to touch. EKG normal sinus rhythm with no ST-T wave changes cardiac markers one set ordered vitals stable no prior cardiac history or family history, Ok to proceed with c-section. CXR when feasible and nsaids as tolerated later  * Dyspnea related to pregnancy - lungs clear on exam able to ambulate without any difficulty, sats normal on room if perissts even after delivery along with her pleuritic chest pain- would do CT chest to r/o PE.  Electronic Signatures: Enid BaasKalisetti, Althia Egolf (MD)  (Signed 06-Feb-14 22:06)  Authored: Brief Consult Note   Last Updated: 06-Feb-14 22:06 by Enid BaasKalisetti, Dailee Manalang (MD)

## 2015-01-03 NOTE — Discharge Summary (Signed)
PATIENT NAME:  Jill [name], Jill [name] MR#:  [id number] DATE OF BIRTH:  06/16/1986  DATE OF ADMISSION:  10/20/2012 DATE OF DISCHARGE:  10/23/2012  PRINCIPAL PROCEDURE: The patient underwent elective repeat cesarean section.  HOSPITAL COURSE:  The patient's postoperative day #1 Hematocrit 31.5%. The patient's postoperative course was uncomplicated. She is discharged to home in good condition. The patient will follow up with Dr. Feliberto GottronSchermerhorn in 2 weeks for wound care or before if she has wound drainage, fever, nausea or vomiting.   DISCHARGE MEDICATIONS: Norco 1 to 2 tablets every 4 to 6 hours, ibuprofen 600 to 800 mg q.8 hours as needed for pain.   The patient will follow-up with Dr. Feliberto GottronSchermerhorn in 2 weeks otherwise.    ____________________________ Suzy Bouchardhomas J. Schermerhorn, MD tjs:ct D: 10/29/2012 10:03:30 ET T: 10/29/2012 12:07:50 ET JOB#: [id number]  cc: Suzy Bouchardhomas J. Schermerhorn, MD, <Dictator> Suzy BouchardHOMAS J SCHERMERHORN MD ELECTRONICALLY SIGNED 10/31/2012 9:01

## 2015-01-04 NOTE — Op Note (Signed)
PATIENT NAME:  Jill [name], WROBLESKI MR#:  [id number] DATE OF BIRTH:  February 16, 1986  DATE OF PROCEDURE:  05/14/2014   PREOPERATIVE DIAGNOSES:  1.  A 37+3 weeks' estimated gestational age.  2.  Abdominal trauma, status post fall.  3.  Painful contractions with cervical dilation. 4.  Three previous cesarean sections.   POSTOPERATIVE DIAGNOSES:  1.  A 37+3 weeks' estimated gestational age.  2.  Abdominal trauma, status post fall.  3.  Painful contractions with cervical dilation. 4.  Three previous cesarean sections.  PROCEDURE:  1.  Repeat low transverse cesarean section.  2.  On-Q pump placement.   ANESTHESIA: Spinal.   SURGEON: Suzy Bouchard, MD  INDICATION: This is a 29 year old gravida 5, para 3, prior cesarean sections, who sustained a fall out of the bathtub approximately 36 hours prior to the procedure. The patient was observed on Labor and Delivery on 05/13/2014 into the morning of 05/14/2014. The patient continued to have contractions, which were painful. Cervix was dilated to 1 cm.   DESCRIPTION OF PROCEDURE: After adequate spinal anesthesia, the patient was placed in the dorsal supine position with a hip roll under the right side. The patient previously received 2 g of IV Ancef. The patient's abdomen was prepped and draped in normal sterile fashion. A Pfannenstiel incision was made 2 fingerbreadths above the symphysis pubis. Sharp dissection was used to identify the fascia. The fascia was opened in the midline and then opened in a  transverse fashion. The superior aspect of the fascia was grasped with Kocher clamps and the recti muscles dissected free. The inferior aspect of the fascia was grasped with Coker clamps and the pyramidalis muscle was dissected free. Entry into the peritoneal cavity was accomplished sharply. The vesicouterine peritoneal fold was identified. A bladder flap was created and the bladder was reflected inferiorly. An attenuated lower uterine segment was  identified.   A low transverse uterine incision was made. Upon entry into the endometrial cavity, clear fluid resulted. The incision was extended with blunt transverse traction. Vertex was brought to the incision. A vacuum was applied to the occiput and 1 gentle pull with the vacuum allowed for delivery of the head. The vacuum was removed. The shoulders and body were delivered without difficulty. A vigorous female was then bulb suctioned on the abdomen while the cord was doubly clamped and the infant was passed to the neonatal staff who assigned Apgar scores of 8 and 9 at one and five minutes. Cord blood was collected.   The placenta was manually delivered and the uterus was exteriorized. The endometrial cavity was wiped clean with laparotomy tape, and the cervix was opened with a ring forceps, and this was passed off the operative field. The uterine incision was closed with 1 chromic suture in a running locking fashion with good approximation of edges. Good hemostasis was noted.  The fallopian tubes and ovaries appeared normal. The posterior cul-de-sac was irrigated and suctioned. The uterus was placed back into the abdominal cavity. Transient uterine atony prompted 0.2 mg intramuscular Methergine during the procedure, while intravenous Pitocin was administered. Good uterine tone, when the uterus was placed back into the abdominal cavity. The uterine incision appeared hemostatic. The paracolic gutters were her wiped clean with laparotomy tape. Interceed was placed over the uterine incision in a T-shaped fashion.   The superior aspect of the fascia was grasped with Kocher clamps, and the On-Q pump catheters were advanced from an infraumbilical presentation to a subfascial presentation. The fascia was  then closed over top of the catheters with 0 Vicryl suture in a running nonlocking fashion. Good approximation of the edges was noted. Subcutaneous tissues were irrigated and bovied for hemostasis, and the skin was  reapproximated with staples. The On-Q pump catheters were secured at the skin level with Dermabond and Steri-Strip'd to the skin and Tegaderm placed overtop this. Each catheter was loaded with 5 mL of 0.5% Marcaine.   There were no complications. Estimated blood loss was 500 mL. Intraoperative fluids were 1500 mL. The patient tolerated the procedure and was taken to the recovery room in good condition.    ____________________________ Suzy Bouchardhomas J. Schermerhorn, MD tjs:MT D: 05/14/2014 10:54:21 ET T: 05/14/2014 11:08:48 ET JOB#: [id number]  cc: Suzy Bouchardhomas J. Schermerhorn, MD, <Dictator> Suzy BouchardHOMAS J SCHERMERHORN MD ELECTRONICALLY SIGNED 05/23/2014 21:39

## 2015-01-04 NOTE — Discharge Summary (Signed)
PATIENT NAMEarney Hamburg:  [name], Jill [name] MR#:  [id number] DATE OF BIRTH:  1986/07/31  DATE OF ADMISSION:  05/13/2014 DATE OF DISCHARGE:  05/17/2014  ADMISSION DIAGNOSES:   1.  Abdominal pain with painful contractions, status post fall.   2.  History of 3 prior cesarean sections.   3.  Intrauterine gestation at 37 weeks and 2 days.    POSTOPERATIVE DIAGNOSES:   1.  Status post cesarean section.   2.  History of abdominal trauma leading to painful contractions.    PROCEDURES PERFORMED:  Repeat low transverse cesarean section performed on 05/14/2014.    HISTORY OF PRESENT ILLNESS:  The patient presented to labor and delivery on the evening of August 31st complaining of abdominal trauma.  This was a result of a fall.  She had painful contractions and bleeding.  She was treated with terbutaline x2 doses as well as Demerol for pain and this did not relieve.  Her cesarean section, she has a history of 3 prior c-sections and elected to have a fourth.    HOSPITAL COURSE:  The patient underwent a repeat low transverse cesarean section without complication.  For more information, please see the dictated operative report from Dr. Feliberto GottronSchermerhorn.    Postoperatively the patient had excellent pain control with an ON-Q pain pump.  On postoperative day 1 she was able to tolerate a clear-liquid diet without nausea or vomiting and had excellent pain control.  She was afebrile with vital signs are otherwise stable.  Her abdomen was soft, appropriately tender to palpation and nondistended.  She had hypoactive bowel sounds.  Her incision was clean, dry, intact and her fundus was nontender.  Her postoperative hemoglobin was 11.2 from 10.0.    By postoperative day 3 the patient was tolerating regular diet without nausea or vomiting.  She was passing flatus and had adequate p.o. pain control.  She was ambulating and voiding without difficulty.  She remained afebrile with stable vital signs.  Her abdomen remained soft,  appropriately tender to palpation and was nondistended.  Her fundus was nontender and firm.  Her incision was clean, dry, intact.    The decision was made to discharge the patient home with [facility].  She was given routine postpartum discharge instructions with specifics instructions to call or return for fever, worsening abdominal pain, redness or drainage from her incision site, uncontrollable pain or heavy vaginal bleeding.  She was given prescriptions for Norco and ibuprofen.  She received Depo-Provera for contraception prior to discharge.  She expressed her understanding of the above instructions and all of her questions were answered to her satisfaction.    DISCHARGE INFORMATION:   1.  Maternal blood type O positive.   2.  Postpartum hemoglobin 11.2.  No ferrous sulfate indicated.   3.  Rubella status:  Immune.   4.  Contraception:  Depo-Provera.   5.  Infant feeding:  Both breast and bottle feeding although this behavior was discouraged.   6.  Postpartum followup 6 weeks with Dr. Feliberto GottronSchermerhorn.     ____________________________ Teena Iraniavid M. Derrill KayGoodman, MD :aw D: 05/17/2014 21:51:18 ET T: 05/17/2014 23:38:44 ET JOB#: [id number]  cc: Teena Iraniavid M. Derrill KayGoodman, MD, <Dictator> DAVID Farrel ConnersM GOODMAN MD ELECTRONICALLY SIGNED 06/12/2014 16:54

## 2015-01-09 ENCOUNTER — Ambulatory Visit
Admit: 2015-01-09 | Disposition: A | Discharge status: Home / Self Care | Payer: Self-pay | Attending: Family Medicine | Admitting: Family Medicine

## 2015-01-21 NOTE — H&P (Signed)
L&D Evaluation:  History:  HPI 29 yo G5 P3013 with LMP of 08/2013 and [facility] for 3 LTCS and close interconceptual spacing (last LTCS was 2014). Marland Kitchen.+Trich with tx and +MJ usage in pregnancy. Scoliosis,HA's, heavy caffeine consumption. Fx knee ligament 2013. NO VB,No ROM, +UC's becoming more uncomfortable.   Presents with contractions   Patient's Medical History migraines, scoliosis, pp hemorrhage   Patient's Surgical History LTCS x3   Medications Pre Natal Vitamins   Allergies NKDA   Social History drugs  +cannaboids   Family History Non-Contributory   ROS:  ROS All systems were reviewed.  HEENT, CNS, GI, GU, Respiratory, CV, Renal and Musculoskeletal systems were found to be normal.   Exam:  Vital Signs stable   General no apparent distress   Mental Status clear   Chest clear   Heart normal sinus rhythm, no murmur/gallop/rubs   Abdomen gravid, non-tender   Estimated Fetal Weight Average for gestational age   Back no CVAT   Edema 1+   Pelvic cervix closed and thick   Mebranes Intact   FHT normal rate with no decels, Cat I   Ucx irregular   Skin dry   Lymph no lymphadenopathy   Impression:  Impression PTL   Plan:  Plan monitor contractions and for cervical change, Terb x 1   Electronic Signatures: Sharee PimpleJones, Rhonna Holster W (CNM)  (Signed 08-Aug-15 15:08)  Authored: L&D Evaluation   Last Updated: 08-Aug-15 15:08 by Sharee PimpleJones, Tangy Drozdowski W (CNM)

## 2015-01-21 NOTE — H&P (Signed)
L&D Evaluation:  History:  HPI 29 yo G5P3 at 29+0 weeks presentedto L+D with vaginal bleding , nausea , vomiting and bilateral back pain. shortly after arrival patient developed a fever . UA showed TNTC wbc 3+ LE. pregnancy complicatd by subchorionic hemorrhage. + [facility] drug screen. 3 previous c/s. Anemia in pregnancy   Patient's Medical History No Chronic Illness   Patient's Surgical History Previous C-Section   Medications Pre [place]atal Vitamins  Iron   Allergies NKDA   Social History drugs   Family History Non-Contributory   ROS:  ROS All systems were reviewed.  HEENT, CNS, GI, GU, Respiratory, CV, Renal and Musculoskeletal systems were found to be normal.   Exam:  Vital Signs stable  afebrile this am   General no apparent distress   Mental Status clear   Chest clear   Heart normal sinus rhythm   Abdomen gravid, non-tender   Estimated Fetal Weight Average for gestational age   Back mild Rt CVAT   Edema no edema   Pelvic internal os closed / 25% / oop / no blood   FHT normal rate with no decels, for 29 weeks   Fetal Heart Rate 140   Ucx absent   Skin dry   Impression:  Impression 29 weeks pyelonephritis. No active bleeding noted . Anemia   Plan:  Plan IV ancef , limit fluid to 3000 cc / 24 hrs. Ferroussequel tid , U/S for placental location and abnormality. Growth u/s   Electronic Signatures: Schermerhorn, Ihor Austinhomas J (MD)  (Signed 04-Jul-15 10:20)  Authored: L&D Evaluation   Last Updated: 04-Jul-15 10:20 by Suzy BouchardSchermerhorn, Thomas J (MD)

## 2015-10-13 ENCOUNTER — Emergency Department
Admission: EM | Admit: 2015-10-13 | Discharge: 2015-10-13 | Disposition: A | Discharge status: Home / Self Care | Payer: BLUE CROSS/BLUE SHIELD | Attending: Emergency Medicine | Admitting: Emergency Medicine

## 2015-10-13 ENCOUNTER — Emergency Department: Payer: BLUE CROSS/BLUE SHIELD

## 2015-10-13 ENCOUNTER — Encounter: Payer: Self-pay | Admitting: Emergency Medicine

## 2015-10-13 DIAGNOSIS — R079 Chest pain, unspecified: Secondary | ICD-10-CM | POA: Diagnosis not present

## 2015-10-13 DIAGNOSIS — G44219 Episodic tension-type headache, not intractable: Secondary | ICD-10-CM | POA: Diagnosis not present

## 2015-10-13 DIAGNOSIS — R05 Cough: Secondary | ICD-10-CM | POA: Diagnosis present

## 2015-10-13 DIAGNOSIS — M7981 Nontraumatic hematoma of soft tissue: Secondary | ICD-10-CM | POA: Diagnosis not present

## 2015-10-13 DIAGNOSIS — R042 Hemoptysis: Secondary | ICD-10-CM | POA: Diagnosis not present

## 2015-10-13 DIAGNOSIS — R202 Paresthesia of skin: Secondary | ICD-10-CM

## 2015-10-13 DIAGNOSIS — Z3202 Encounter for pregnancy test, result negative: Secondary | ICD-10-CM | POA: Diagnosis not present

## 2015-10-13 LAB — URINALYSIS COMPLETE WITH MICROSCOPIC (ARMC ONLY)
Bilirubin Urine: NEGATIVE
Glucose, UA: NEGATIVE mg/dL
Hgb urine dipstick: NEGATIVE
Ketones, ur: NEGATIVE mg/dL
Nitrite: POSITIVE — AB
Protein, ur: NEGATIVE mg/dL
Specific Gravity, Urine: 1.008 (ref 1.005–1.030)
pH: 7 (ref 5.0–8.0)

## 2015-10-13 LAB — CBC
HCT: 36 % (ref 35.0–47.0)
Hemoglobin: 11.9 g/dL — ABNORMAL LOW (ref 12.0–16.0)
MCH: 27.9 pg (ref 26.0–34.0)
MCHC: 33.2 g/dL (ref 32.0–36.0)
MCV: 83.9 fL (ref 80.0–100.0)
Platelets: 301 10*3/uL (ref 150–440)
RBC: 4.28 MIL/uL (ref 3.80–5.20)
RDW: 13.3 % (ref 11.5–14.5)
WBC: 6.1 10*3/uL (ref 3.6–11.0)

## 2015-10-13 LAB — COMPREHENSIVE METABOLIC PANEL
ALT: 12 U/L — ABNORMAL LOW (ref 14–54)
AST: 14 U/L — ABNORMAL LOW (ref 15–41)
Albumin: 4.3 g/dL (ref 3.5–5.0)
Alkaline Phosphatase: 61 U/L (ref 38–126)
Anion gap: 7 (ref 5–15)
BUN: 8 mg/dL (ref 6–20)
CO2: 27 mmol/L (ref 22–32)
Calcium: 9.3 mg/dL (ref 8.9–10.3)
Chloride: 104 mmol/L (ref 101–111)
Creatinine, Ser: 0.94 mg/dL (ref 0.44–1.00)
GFR calc Af Amer: >60 mL/min (ref >60)
GFR calc non Af Amer: >60 mL/min (ref >60)
Glucose, Bld: 112 mg/dL — ABNORMAL HIGH (ref 65–99)
Potassium: 3.6 mmol/L (ref 3.5–5.1)
Sodium: 138 mmol/L (ref 135–145)
Total Bilirubin: 0.2 mg/dL — ABNORMAL LOW (ref 0.3–1.2)
Total Protein: 7.5 g/dL (ref 6.5–8.1)

## 2015-10-13 LAB — TROPONIN I: Troponin I: <0.03 ng/mL (ref <0.031)

## 2015-10-13 LAB — POCT PREGNANCY, URINE: Preg Test, Ur: NEGATIVE

## 2015-10-13 NOTE — ED Notes (Signed)
Pt states she started coughing 2 hours ago, was coughing up blood, states she is shob, and has pressure under her left breast, appears in no distress.

## 2015-10-13 NOTE — Discharge Instructions (Signed)
Cough, Adult A cough helps to clear your throat and lungs. A cough may last only 2-3 weeks (acute), or it may last longer than 8 weeks (chronic). Many different things can cause a cough. A cough may be a sign of an illness or another medical condition. HOME CARE  Pay attention to any changes in your cough.  Take medicines only as told by your doctor.  If you were prescribed an antibiotic medicine, take it as told by your doctor. Do not stop taking it even if you start to feel better.  Talk with your doctor before you try using a cough medicine.  Drink enough fluid to keep your pee (urine) clear or pale yellow.  If the air is dry, use a cold steam vaporizer or humidifier in your home.  Stay away from things that make you cough at work or at home.  If your cough is worse at night, try using extra pillows to raise your head up higher while you sleep.  Do not smoke, and try not to be around smoke. If you need help quitting, ask your doctor.  Do not have caffeine.  Do not drink alcohol.  Rest as needed. GET HELP IF:  You have new problems (symptoms).  You cough up yellow fluid (pus).  Your cough does not get better after 2-3 weeks, or your cough gets worse.  Medicine does not help your cough and you are not sleeping well.  You have pain that gets worse or pain that is not helped with medicine.  You have a fever.  You are losing weight and you do not know why.  You have night sweats. GET HELP RIGHT AWAY IF:  You cough up blood.  You have trouble breathing.  Your heartbeat is very fast.   This information is not intended to replace advice given to you by your health care provider. Make sure you discuss any questions you have with your health care provider.   Document Released: 05/13/2011 Document Revised: 05/21/2015 Document Reviewed: 11/06/2014 Elsevier Interactive Patient Education 2016 Elsevier Inc.  Hemoptysis Hemoptysis means coughing up blood. The blood may  come from the lungs and airways. It can also come from bleeding that occurs outside the lungs and airways. Coughing up blood can be a sign of a minor problem or a serious medical condition.  HOME CARE  Only take medicine as told by your doctor. Do not use medicines that help you stop coughing (cough suppressants) unless your doctor approves.  If you are given antibiotic medicine, take it as told. Finish it even if you start to feel better.  Do not smoke. Also avoid being around others when they are smoking.  Follow up with your doctor as told. GET HELP RIGHT AWAY IF:  You cough up bloody spit (mucus) for longer than a week.  You have a blood-producing cough that is severe or getting worse.  You have a blood-producing cough thatcomes and goes over time.  You have trouble breathing.   You throw up (vomit) blood.  You have bloody or black poop (stool).  You have chest pain.   You have night sweats.  You feel faint or pass out.   You have a fever or lasting symptoms for more than 2-3 days.  You have a fever and your symptoms suddenly get worse. MAKE SURE YOU:  Understand these instructions.  Will watch your condition.  Will get help right away if you are not doing well or get worse.  This information is not intended to replace advice given to you by your health care provider. Make sure you discuss any questions you have with your health care provider.   Document Released: 08/16/2012 Document Reviewed: 08/16/2012 Elsevier Interactive Patient Education 2016 Elsevier Inc.  Paresthesia Paresthesia is a burning or prickling feeling. This feeling can happen in any part of the body. It often happens in the hands, arms, legs, or feet. Usually, it is not painful. In most cases, the feeling goes away in a short time and is not a sign of a serious problem. HOME CARE  Avoid drinking alcohol.  Try massage or needle therapy (acupuncture) to help with your problems.  Keep  all follow-up visits as told by your doctor. This is important. GET HELP IF:  You keep on having episodes of paresthesia.  Your burning or prickling feeling gets worse when you walk.  You have pain or cramps.  You feel dizzy.  You have a rash. GET HELP RIGHT AWAY IF:  You feel weak.  You have trouble walking or moving.  You have problems speaking, understanding, or seeing.  You feel confused.  You cannot control when you pee (urinate) or poop (bowel movement).  You lose feeling (numbness) after an injury.  You pass out (faint).   This information is not intended to replace advice given to you by your health care provider. Make sure you discuss any questions you have with your health care provider.   Document Released: 08/12/2008 Document Revised: 01/14/2015 Document Reviewed: 08/26/2014 Elsevier Interactive Patient Education 2016 Elsevier Inc.  Tension Headache A tension headache is pain, pressure, or aching that is felt over the front and sides of your head. These headaches can last from 30 minutes to several days. HOME CARE Managing Pain  Take over-the-counter and prescription medicines only as told by your doctor.  Lie down in a dark, quiet room when you have a headache.  If directed, apply ice to your head and neck area:  Put ice in a plastic bag.  Place a towel between your skin and the bag.  Leave the ice on for 20 minutes, 2-3 times per day.  Use a heating pad or a hot shower to apply heat to your head and neck area as told by your doctor. Eating and Drinking  Eat meals on a regular schedule.  Do not drink a lot of alcohol.  Do not use a lot of caffeine, or stop using caffeine. General Instructions  Keep all follow-up visits as told by your doctor. This is important.  Keep a journal to find out if certain things bring on headaches. For example, write down:  What you eat and drink.  How much sleep you get.  Any change to your diet or  medicines.  Try getting a massage, or doing other things that help you to relax.  Lessen stress.  Sit up straight. Do not tighten (tense) your muscles.  Do not use tobacco products. This includes cigarettes, chewing tobacco, or e-cigarettes. If you need help quitting, ask your doctor.  Exercise regularly as told by your doctor.  Get enough sleep. This may mean 7-9 hours of sleep. GET HELP IF:  Your symptoms are not helped by medicine.  You have a headache that feels different from your usual headache.  You feel sick to your stomach (nauseous) or you throw up (vomit).  You have a fever. GET HELP RIGHT AWAY IF:  Your headache becomes very bad.  You keep throwing up.  You have a stiff neck.  You have trouble seeing.  You have trouble speaking.  You have pain in your eye or ear.  Your muscles are weak or you lose muscle control.  You lose your balance or you have trouble walking.  You feel like you will pass out (faint) or you pass out.  You have confusion.   This information is not intended to replace advice given to you by your health care provider. Make sure you discuss any questions you have with your health care provider.   Document Released: 11/24/2009 Document Revised: 05/21/2015 Document Reviewed: 12/23/2014 Elsevier Interactive Patient Education [employer]

## 2015-10-13 NOTE — ED Provider Notes (Signed)
[facility] Emergency Department Provider Note  ____________________________________________  Time seen: Approximately 3:29 PM  I have reviewed the triage vital signs and the nursing notes.   HISTORY  Chief Complaint Cough and Chest Pain    HPI Jill [name] is a 30 y.o. female, NAD. Presents to the emergency department with a two-hour history of one episode of coughing which produced "a handful of dark blood". As a copy by left lower chest pain occurring below the left breast. Notes she had a right-sided headache this morning. Also notes an episode of syncope this morning in which she believes she hit her head on the floor. Notes she woke up on the floor. Episode of cough with hemoptysis occurred after the fact. Notes she has had temporal headaches off and on for the last 2 months with some left-sided tingling. Denies any LOC prior to today. No personal or family history of neurologic, cardiac, pulmonary diseases. Denies URI symptoms, back pain, abdominal pain, nausea, vomiting, changes in speech or gait, loss of bowel/bladder control. No prior injuries, traumas, falls. No shortness of breath or difficulty breathing or swallowing.   History reviewed. No pertinent past medical history.  There are no active problems to display for this patient.   Past Surgical History  Procedure Laterality Date  . Cesarean section      No current outpatient prescriptions on file.  Allergies Review of patient's allergies indicates no known allergies.  No family history on file.  Social History Social History  Substance Use Topics  . Smoking status: Never Smoker   . Smokeless tobacco: None  . Alcohol Use: No     Review of Systems  Constitutional: No fever/chills. No fatigue.  Eyes: No visual changes. ENT: No sore throat, nasal congestion, rhinorrhea, ear pain, post nasal drip.  Cardiovascular: Chest pain below left breast. No palpitations.  Respiratory: Cough  productive of bloody sputum. No wheezing, chest congestion.  Gastrointestinal: No abdominal pain.  No nausea, vomiting.  No diarrhea.  No constipation. Genitourinary: Negative for dysuria. No hematuria. No urinary hesitancy, urgency or increased frequency. Musculoskeletal: Negative for back pain. No general myalgias.  Skin: Negative for rash, bruising, lacerations or skin sores.  Neurological: Temporal headaches. Tingling left side. No numbness or loss of bodily control. No changes in gait.  10-point ROS otherwise negative.  ____________________________________________   PHYSICAL EXAM:  VITAL SIGNS: ED Triage Vitals  Enc Vitals Group     BP 10/13/15 1448 115/82 mmHg     Pulse Rate 10/13/15 1448 112     Resp 10/13/15 1448 20     Temp 10/13/15 1448 98.5 F (36.9 C)     Temp Source 10/13/15 1448 Oral     SpO2 10/13/15 1448 97 %     Weight 10/13/15 1439 150 lb (68.04 kg)     Height 10/13/15 1439  (1.6 m)     Head Cir --      Peak Flow --      Pain Score 10/13/15 1440 10     Pain Loc --      Pain Edu? --      Excl. in GC? --     Constitutional: Alert and oriented. Well appearing and in no acute distress. Eyes: Conjunctivae are normal. PERRL. EOMI without pain.  Head: Atraumatic. ENT:         Nose: No congestion/rhinnorhea. No epistaxis.      Mouth/Throat: Mucous membranes are moist. No oral lesions.  Neck: No stridor. Supple  with FROM.  Hematological/Lymphatic/Immunilogical: No cervical lymphadenopathy. Cardiovascular: Normal rate, regular rhythm. Normal S1 and S2.  Good peripheral circulation. Respiratory: Normal respiratory effort without tachypnea or retractions. Lungs CTAB. Gastrointestinal: Soft and nontender. No distention. No CVA tenderness. Musculoskeletal: No lower extremity tenderness nor edema.  No joint effusions. Neurologic:  Normal speech and language. Decreased grip strength of left upper extremity as compared to right. CN 2-12 grossly in tact.  Skin:   Skin is warm, dry and intact. No rash noted. Erythematous/blue ecchymosis noted about the left sternum.  Psychiatric: Mood and affect are normal. Speech and behavior are normal. Patient exhibits appropriate insight and judgement.   ____________________________________________   LABS (all labs ordered are listed, but only abnormal results are displayed)  Labs Reviewed  CBC - Abnormal; Notable for the following:    Hemoglobin 11.9 (*)    All other components within normal limits  COMPREHENSIVE METABOLIC PANEL - Abnormal; Notable for the following:    Glucose, Bld 112 (*)    AST 14 (*)    ALT 12 (*)    Total Bilirubin 0.2 (*)    All other components within normal limits  URINALYSIS COMPLETEWITH MICROSCOPIC ([facility] ONLY) - Abnormal; Notable for the following:    Color, Urine YELLOW (*)    APPearance HAZY (*)    Nitrite POSITIVE (*)    Leukocytes, UA TRACE (*)    Bacteria, UA RARE (*)    Squamous Epithelial / LPF 0-5 (*)    All other components within normal limits  TROPONIN I  POC URINE PREG, ED  POCT PREGNANCY, URINE   ____________________________________________  EKG  I personally reviewed EKG and noted rhythm to be sinus tachycardia. No evidence of acute changes or STEMI. EKG was also viewed by Dr Rockne Menghini in agreement.      ____________________________________________  RADIOLOGY I personally viewed and evaluated these images (plain radiographs) as part of my medical decision making, as well as reviewing the written report by the radiologist.   Dg Chest 2 View  10/13/2015  CLINICAL DATA:  Cough for 3 hours.  Coughing up blood. EXAM: CHEST  2 VIEW COMPARISON:  CT chest 12/24/2012 FINDINGS: The heart size and mediastinal contours are within normal limits. Both lungs are clear. The visualized skeletal structures are unremarkable. IMPRESSION: No active cardiopulmonary disease. Electronically Signed   By: Elige Ko   On: 10/13/2015 15:52   Ct Head Wo  Contrast  10/13/2015  CLINICAL DATA:  Pt states she started coughing 2 hours ago, was coughing up blood, states she is SHOB, and has pressure under her left breast, appears in no distress. EXAM: CT HEAD WITHOUT CONTRAST TECHNIQUE: Contiguous axial images were obtained from the base of the skull through the vertex without intravenous contrast. COMPARISON:  11/23/2011 FINDINGS: There is no intra or extra-axial fluid collection or mass lesion. The basilar cisterns and ventricles have a normal appearance. There is no CT evidence for acute infarction or hemorrhage. Bone windows are unremarkable. There is cerumen in the external auditory canals bilaterally. IMPRESSION: No evidence for acute intracranial abnormality. Electronically Signed   By: Norva Pavlov M.D.   On: 10/13/2015 16:19    ____________________________________________    PROCEDURES  Procedure(s) performed: None   Medications - No data to display   ____________________________________________   INITIAL IMPRESSION / ASSESSMENT AND PLAN / ED COURSE  Pertinent labs & imaging results that were available during my care of the patient were reviewed by me and considered in my medical decision  making (see chart for details). No significant abnormalities noted on CBC, CMP. Negative Troponin. UA with leukocytes and positive nitrites, but patient currently asymptomatic and not pregnant. CXR without evidence of cardiopulmonary abnormalities. Head CT without evidence of acute intracranial abnormalities.   Patient's diagnosis of cough with hemoptysis, tension type headache and paraesthesias based on current history. All labs and imaging without gross abnormalities. Patient will be discharged home reassured of her work up in the ED this afternoon. She should avoid NSAIDs and ASA but may use Tylenol as needed for headache.  Patient is to follow up with her PCP at the [place] [facility] within 24 hours to continue work up of current complaints. Patient is  given ED precautions to return to the ED for any worsening or new symptoms.    ____________________________________________  FINAL CLINICAL IMPRESSION(S) / ED DIAGNOSES  Final diagnoses:  Cough with hemoptysis  Episodic tension-type headache, not intractable  Paresthesia of left upper extremity      NEW MEDICATIONS STARTED DURING THIS VISIT:  There are no discharge medications for this patient.        Hope Pigeon, PA-C 10/13/15 1642  Myrna Blazer, MD 10/14/15 Marlyne Beards

## 2015-10-13 NOTE — ED Notes (Signed)
States she developed intermittent chest pain for several days   Pain increased with movement   But then had cough today which was productive  Noticed some blood in sputum.  No fever

## 2016-02-12 ENCOUNTER — Emergency Department
Admission: EM | Admit: 2016-02-12 | Discharge: 2016-02-13 | Disposition: A | Discharge status: Home / Self Care | Payer: BLUE CROSS/BLUE SHIELD | Attending: Emergency Medicine | Admitting: Emergency Medicine

## 2016-02-12 ENCOUNTER — Emergency Department: Payer: BLUE CROSS/BLUE SHIELD

## 2016-02-12 ENCOUNTER — Encounter: Payer: Self-pay | Admitting: Emergency Medicine

## 2016-02-12 DIAGNOSIS — Y999 Unspecified external cause status: Secondary | ICD-10-CM | POA: Diagnosis not present

## 2016-02-12 DIAGNOSIS — T148 Other injury of unspecified body region: Secondary | ICD-10-CM | POA: Diagnosis not present

## 2016-02-12 DIAGNOSIS — S0093XA Contusion of unspecified part of head, initial encounter: Secondary | ICD-10-CM | POA: Insufficient documentation

## 2016-02-12 DIAGNOSIS — M25512 Pain in left shoulder: Secondary | ICD-10-CM | POA: Diagnosis not present

## 2016-02-12 DIAGNOSIS — R109 Unspecified abdominal pain: Secondary | ICD-10-CM | POA: Insufficient documentation

## 2016-02-12 DIAGNOSIS — R0789 Other chest pain: Secondary | ICD-10-CM | POA: Diagnosis not present

## 2016-02-12 DIAGNOSIS — Y939 Activity, unspecified: Secondary | ICD-10-CM | POA: Diagnosis not present

## 2016-02-12 DIAGNOSIS — T148XXA Other injury of unspecified body region, initial encounter: Secondary | ICD-10-CM

## 2016-02-12 DIAGNOSIS — S0990XA Unspecified injury of head, initial encounter: Secondary | ICD-10-CM | POA: Diagnosis present

## 2016-02-12 DIAGNOSIS — Y92410 Unspecified street and highway as the place of occurrence of the external cause: Secondary | ICD-10-CM | POA: Insufficient documentation

## 2016-02-12 LAB — BASIC METABOLIC PANEL
Anion gap: 8 (ref 5–15)
BUN: 10 mg/dL (ref 6–20)
CO2: 25 mmol/L (ref 22–32)
Calcium: 8.8 mg/dL — ABNORMAL LOW (ref 8.9–10.3)
Chloride: 106 mmol/L (ref 101–111)
Creatinine, Ser: 0.96 mg/dL (ref 0.44–1.00)
GFR calc Af Amer: >60 mL/min (ref >60)
GFR calc non Af Amer: >60 mL/min (ref >60)
Glucose, Bld: 90 mg/dL (ref 65–99)
Potassium: 3.3 mmol/L — ABNORMAL LOW (ref 3.5–5.1)
Sodium: 139 mmol/L (ref 135–145)

## 2016-02-12 LAB — URINALYSIS COMPLETE WITH MICROSCOPIC (ARMC ONLY)
Bilirubin Urine: NEGATIVE
Glucose, UA: NEGATIVE mg/dL
Leukocytes, UA: NEGATIVE
Nitrite: NEGATIVE
Protein, ur: NEGATIVE mg/dL
Specific Gravity, Urine: 1.006 (ref 1.005–1.030)
pH: 6 (ref 5.0–8.0)

## 2016-02-12 LAB — CBC WITH DIFFERENTIAL/PLATELET
Basophils Absolute: 0 10*3/uL (ref 0–0.1)
Basophils Relative: 1 %
Eosinophils Absolute: 0.1 10*3/uL (ref 0–0.7)
Eosinophils Relative: 2 %
HCT: 33.5 % — ABNORMAL LOW (ref 35.0–47.0)
Hemoglobin: 11.1 g/dL — ABNORMAL LOW (ref 12.0–16.0)
Lymphocytes Relative: 48 %
Lymphs Abs: 3 10*3/uL (ref 1.0–3.6)
MCH: 27.8 pg (ref 26.0–34.0)
MCHC: 33 g/dL (ref 32.0–36.0)
MCV: 84.2 fL (ref 80.0–100.0)
Monocytes Absolute: 0.4 10*3/uL (ref 0.2–0.9)
Monocytes Relative: 6 %
Neutro Abs: 2.7 10*3/uL (ref 1.4–6.5)
Neutrophils Relative %: 43 %
Platelets: 235 10*3/uL (ref 150–440)
RBC: 3.99 MIL/uL (ref 3.80–5.20)
RDW: 14 % (ref 11.5–14.5)
WBC: 6.3 10*3/uL (ref 3.6–11.0)

## 2016-02-12 LAB — POCT PREGNANCY, URINE: Preg Test, Ur: NEGATIVE

## 2016-02-12 MED ORDER — MORPHINE SULFATE (PF) 4 MG/ML IV SOLN
4.0000 mg | Freq: Once | INTRAVENOUS | Status: AC
  Administered 2016-02-12: 4 mg via INTRAVENOUS

## 2016-02-12 MED ORDER — ONDANSETRON HCL 4 MG/2ML IJ SOLN
INTRAMUSCULAR | Status: AC
  Filled 2016-02-12: qty 2

## 2016-02-12 MED ORDER — ONDANSETRON 4 MG PO TBDP: ORAL_TABLET | ORAL | Status: DC

## 2016-02-12 MED ORDER — DOCUSATE SODIUM 100 MG PO CAPS: ORAL_CAPSULE | ORAL | Status: DC

## 2016-02-12 MED ORDER — ONDANSETRON HCL 4 MG/2ML IJ SOLN
4.0000 mg | INTRAMUSCULAR | Status: AC
  Administered 2016-02-12: 4 mg via INTRAVENOUS

## 2016-02-12 MED ORDER — HYDROCODONE-ACETAMINOPHEN 5-325 MG PO TABS
ORAL_TABLET | ORAL | Status: AC
  Filled 2016-02-12: qty 2

## 2016-02-12 MED ORDER — HYDROCODONE-ACETAMINOPHEN 5-325 MG PO TABS
2.0000 | ORAL_TABLET | Freq: Once | ORAL | Status: DC
  Filled 2016-02-12: qty 2

## 2016-02-12 MED ORDER — TETANUS-DIPHTH-ACELL PERTUSSIS 5-2.5-18.5 LF-MCG/0.5 IM SUSP
INTRAMUSCULAR | Status: AC
  Filled 2016-02-12: qty 0.5

## 2016-02-12 MED ORDER — TETANUS-DIPHTH-ACELL PERTUSSIS 5-2.5-18.5 LF-MCG/0.5 IM SUSP
0.5000 mL | Freq: Once | INTRAMUSCULAR | Status: AC
  Administered 2016-02-12: 0.5 mL via INTRAMUSCULAR

## 2016-02-12 MED ORDER — HYDROCODONE-ACETAMINOPHEN 5-325 MG PO TABS: 1.0000 | ORAL_TABLET | ORAL | Status: DC | PRN

## 2016-02-12 MED ORDER — HALOPERIDOL LACTATE 5 MG/ML IJ SOLN
1.0000 mg | Freq: Once | INTRAMUSCULAR | Status: AC
  Administered 2016-02-13: 1 mg via INTRAVENOUS
  Filled 2016-02-12: qty 1

## 2016-02-12 MED ORDER — DIPHENHYDRAMINE HCL 50 MG/ML IJ SOLN
12.5000 mg | Freq: Once | INTRAMUSCULAR | Status: AC
  Administered 2016-02-13: 12.5 mg via INTRAVENOUS
  Filled 2016-02-12: qty 1

## 2016-02-12 MED ORDER — ONDANSETRON HCL 4 MG/2ML IJ SOLN
INTRAMUSCULAR | Status: DC
Start: 2016-02-12 — End: 2016-02-13
  Filled 2016-02-12: qty 2

## 2016-02-12 MED ORDER — MORPHINE SULFATE (PF) 4 MG/ML IV SOLN
INTRAVENOUS | Status: AC
  Filled 2016-02-12: qty 1

## 2016-02-12 MED ORDER — SODIUM CHLORIDE 0.9 % IV BOLUS (SEPSIS)
1000.0000 mL | INTRAVENOUS | Status: AC
  Administered 2016-02-12: 1000 mL via INTRAVENOUS
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
## 2016-02-12 NOTE — ED Notes (Signed)
MD Forbach at bedside. 
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
## 2016-02-12 NOTE — ED Notes (Signed)
Pt c/o productive cough with yellow sputu
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
## 2016-02-12 NOTE — ED Notes (Signed)
Patient transported to CT 

## 2016-02-13 DIAGNOSIS — S0093XA Contusion of unspecified part of head, initial encounter: Secondary | ICD-10-CM | POA: Diagnosis not present

## 2016-02-13 NOTE — ED Notes (Signed)
Patient given meal tray. Had a few bites but still nauseated. Given medication for nausea. Will hold norco until patient able to tolerate PO. Family at bedside. Will continue to monitor.

## 2016-02-16 ENCOUNTER — Emergency Department
Admission: EM | Admit: 2016-02-16 | Discharge: 2016-02-16 | Disposition: A | Discharge status: Home / Self Care | Payer: BLUE CROSS/BLUE SHIELD | Attending: Emergency Medicine | Admitting: Emergency Medicine

## 2016-02-16 ENCOUNTER — Encounter: Payer: Self-pay | Admitting: Emergency Medicine

## 2016-02-16 DIAGNOSIS — R202 Paresthesia of skin: Secondary | ICD-10-CM

## 2016-02-16 MED ORDER — CYCLOBENZAPRINE HCL 5 MG PO TABS: 5.0000 mg | ORAL_TABLET | Freq: Three times a day (TID) | ORAL | Status: DC | PRN

## 2016-02-16 MED ORDER — NAPROXEN 500 MG PO TBEC: 500.0000 mg | DELAYED_RELEASE_TABLET | Freq: Two times a day (BID) | ORAL | Status: DC
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
## 2016-02-16 NOTE — Discharge Instructions (Signed)
Paresthesia Paresthesia is an abnormal burning or prickling sensation. This sensation is generally felt in the hands, arms, legs, or feet. However, it may occur in any part of the body. Usually, it is not painful. The feeling may be described as:  Tingling or numbness.  Pins and needles.  Skin crawling.  Buzzing.  Limbs falling asleep.  Itching. Most people experience temporary (transient) paresthesia at some time in their lives. Paresthesia may occur when you breathe too quickly (hyperventilation). It can also occur without any apparent cause. Commonly, paresthesia occurs when pressure is placed on a nerve. The sensation quickly goes away after the pressure is removed. For some people, however, paresthesia is a long-lasting (chronic) condition that is caused by an underlying disorder. If you continue to have paresthesia, you may need further medical evaluation. HOME CARE INSTRUCTIONS Watch your condition for any changes. Taking the following actions may help to lessen any discomfort that you are feeling:  Avoid drinking alcohol.  Try acupuncture or massage to help relieve your symptoms.  Keep all follow-up visits as directed by your health care provider. This is important. SEEK MEDICAL CARE IF:  You continue to have episodes of paresthesia.  Your burning or prickling feeling gets worse when you walk.  You have pain, cramps, or dizziness.  You develop a rash. SEEK IMMEDIATE MEDICAL CARE IF:  You feel weak.  You have trouble walking or moving.  You have problems with speech, understanding, or vision.  You feel confused.  You cannot control your bladder or bowel movements.  You have numbness after an injury.  You faint.   This information is not intended to replace advice given to you by your health care provider. Make sure you discuss any questions you have with your health care provider.   Document Released: 08/20/2002 Document Revised: 01/14/2015 Document Reviewed:  08/26/2014 Elsevier Interactive Patient Education 2016 [employer]   Your exam appears normal. Follow-up with [facility] for continued symptoms. Take the prescription meds as directed. Apply ice to the arm or shoulders as needed.
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
## 2016-02-24 LAB — OB RESULTS CONSOLE GC/CHLAMYDIA
Chlamydia: NEGATIVE
Gonorrhea: NEGATIVE

## 2016-03-22 LAB — OB RESULTS CONSOLE RPR: RPR: NONREACTIVE

## 2016-03-22 LAB — OB RESULTS CONSOLE VARICELLA ZOSTER ANTIBODY, IGG: Varicella: IMMUNE

## 2016-03-22 LAB — OB RESULTS CONSOLE HEPATITIS B SURFACE ANTIGEN: Hepatitis B Surface Ag: NEGATIVE

## 2016-04-05 DIAGNOSIS — Z8669 Personal history of other diseases of the nervous system and sense organs: Secondary | ICD-10-CM | POA: Insufficient documentation

## 2016-04-05 DIAGNOSIS — O34219 Maternal care for unspecified type scar from previous cesarean delivery: Secondary | ICD-10-CM | POA: Insufficient documentation

## 2016-05-18 ENCOUNTER — Emergency Department
Admission: EM | Admit: 2016-05-18 | Discharge: 2016-05-18 | Disposition: A | Discharge status: Home / Self Care | Payer: BLUE CROSS/BLUE SHIELD | Attending: Emergency Medicine | Admitting: Emergency Medicine

## 2016-05-18 ENCOUNTER — Emergency Department: Payer: BLUE CROSS/BLUE SHIELD

## 2016-05-18 ENCOUNTER — Encounter: Payer: Self-pay | Admitting: Medical Oncology

## 2016-05-18 DIAGNOSIS — A599 Trichomoniasis, unspecified: Secondary | ICD-10-CM | POA: Insufficient documentation

## 2016-05-18 DIAGNOSIS — K625 Hemorrhage of anus and rectum: Secondary | ICD-10-CM | POA: Diagnosis not present

## 2016-05-18 DIAGNOSIS — O4692 Antepartum hemorrhage, unspecified, second trimester: Secondary | ICD-10-CM | POA: Diagnosis present

## 2016-05-18 DIAGNOSIS — O2342 Unspecified infection of urinary tract in pregnancy, second trimester: Secondary | ICD-10-CM | POA: Insufficient documentation

## 2016-05-18 DIAGNOSIS — Z3A16 16 weeks gestation of pregnancy: Secondary | ICD-10-CM | POA: Diagnosis not present

## 2016-05-18 DIAGNOSIS — Z791 Long term (current) use of non-steroidal anti-inflammatories (NSAID): Secondary | ICD-10-CM | POA: Diagnosis not present

## 2016-05-18 DIAGNOSIS — O200 Threatened abortion: Secondary | ICD-10-CM | POA: Diagnosis not present

## 2016-05-18 DIAGNOSIS — N390 Urinary tract infection, site not specified: Secondary | ICD-10-CM

## 2016-05-18 LAB — CBC WITH DIFFERENTIAL/PLATELET
Basophils Absolute: 0 10*3/uL (ref 0–0.1)
Basophils Relative: 1 %
Eosinophils Absolute: 0.1 10*3/uL (ref 0–0.7)
Eosinophils Relative: 1 %
HCT: 33.7 % — ABNORMAL LOW (ref 35.0–47.0)
Hemoglobin: 11.8 g/dL — ABNORMAL LOW (ref 12.0–16.0)
Lymphocytes Relative: 29 %
Lymphs Abs: 1.9 10*3/uL (ref 1.0–3.6)
MCH: 29.7 pg (ref 26.0–34.0)
MCHC: 34.8 g/dL (ref 32.0–36.0)
MCV: 85.4 fL (ref 80.0–100.0)
Monocytes Absolute: 0.3 10*3/uL (ref 0.2–0.9)
Monocytes Relative: 5 %
Neutro Abs: 4.2 10*3/uL (ref 1.4–6.5)
Neutrophils Relative %: 64 %
Platelets: 220 10*3/uL (ref 150–440)
RBC: 3.95 MIL/uL (ref 3.80–5.20)
RDW: 14.4 % (ref 11.5–14.5)
WBC: 6.5 10*3/uL (ref 3.6–11.0)

## 2016-05-18 LAB — COMPREHENSIVE METABOLIC PANEL
ALT: 10 U/L — ABNORMAL LOW (ref 14–54)
AST: 16 U/L (ref 15–41)
Albumin: 3.4 g/dL — ABNORMAL LOW (ref 3.5–5.0)
Alkaline Phosphatase: 43 U/L (ref 38–126)
Anion gap: 6 (ref 5–15)
BUN: 7 mg/dL (ref 6–20)
CO2: 25 mmol/L (ref 22–32)
Calcium: 8.9 mg/dL (ref 8.9–10.3)
Chloride: 104 mmol/L (ref 101–111)
Creatinine, Ser: 0.61 mg/dL (ref 0.44–1.00)
GFR calc Af Amer: >60 mL/min (ref >60)
GFR calc non Af Amer: >60 mL/min (ref >60)
Glucose, Bld: 88 mg/dL (ref 65–99)
Potassium: 3.7 mmol/L (ref 3.5–5.1)
Sodium: 135 mmol/L (ref 135–145)
Total Bilirubin: 0.3 mg/dL (ref 0.3–1.2)
Total Protein: 6.6 g/dL (ref 6.5–8.1)

## 2016-05-18 LAB — WET PREP, GENITAL
Clue Cells Wet Prep HPF POC: NONE SEEN
Sperm: NONE SEEN
Yeast Wet Prep HPF POC: NONE SEEN

## 2016-05-18 LAB — URINALYSIS COMPLETE WITH MICROSCOPIC (ARMC ONLY)
Bilirubin Urine: NEGATIVE
Glucose, UA: NEGATIVE mg/dL
Hgb urine dipstick: NEGATIVE
Ketones, ur: NEGATIVE mg/dL
Nitrite: NEGATIVE
Protein, ur: NEGATIVE mg/dL
Specific Gravity, Urine: 1.011 (ref 1.005–1.030)
pH: 8 (ref 5.0–8.0)

## 2016-05-18 LAB — POCT PREGNANCY, URINE: Preg Test, Ur: POSITIVE — AB

## 2016-05-18 LAB — CHLAMYDIA/NGC RT PCR (ARMC ONLY)
Chlamydia Tr: NOT DETECTED
N gonorrhoeae: NOT DETECTED

## 2016-05-18 LAB — ABO/RH: ABO/RH(D): O POS

## 2016-05-18 LAB — HCG, QUANTITATIVE, PREGNANCY: hCG, Beta Chain, Quant, S: 35985 m[IU]/mL — ABNORMAL HIGH (ref <5)

## 2016-05-18 MED ORDER — AZITHROMYCIN 500 MG PO TABS
1000.0000 mg | ORAL_TABLET | Freq: Once | ORAL | Status: AC
  Administered 2016-05-18: 1000 mg via ORAL
  Filled 2016-05-18: qty 2

## 2016-05-18 MED ORDER — METRONIDAZOLE 500 MG PO TABS: 500.0000 mg | ORAL_TABLET | Freq: Two times a day (BID) | ORAL | 0 refills | Status: DC

## 2016-05-18 MED ORDER — CEFTRIAXONE SODIUM 250 MG IJ SOLR
250.0000 mg | INTRAMUSCULAR | Status: DC
  Administered 2016-05-18: 250 mg via INTRAMUSCULAR
  Filled 2016-05-18: qty 250

## 2016-05-18 MED ORDER — NITROFURANTOIN MONOHYD MACRO 100 MG PO CAPS: 100.0000 mg | ORAL_CAPSULE | Freq: Two times a day (BID) | ORAL | 0 refills | Status: DC

## 2016-05-18 NOTE — ED Triage Notes (Signed)
Pt is [redacted] weeks pregnant, reports lower abdominal cramping and vaginal bleeding.

## 2016-05-18 NOTE — ED Provider Notes (Signed)
[facility] Emergency Department Provider Note        Time seen: ----------------------------------------- 9:12 AM on 05/18/2016 -----------------------------------------    I have reviewed the triage vital signs and the nursing notes.   HISTORY  Chief Complaint Vaginal Bleeding    HPI Jill [name] is a 30 y.o. female who presents to ER for lower abdominal cramping with vaginal bleeding and rectal bleeding. Patient reports she [redacted] weeks pregnant, has not had bleeding issues in the past. She is G5 P4, Ab0. Patient states she's never had bleeding with prior pregnancies. Patient states there is blood mixed with her stool, states she has not been constipated, denies dysuria. She denies fevers or other complaints.   No past medical history on file.  There are no active problems to display for this patient.   Past Surgical History:  Procedure Laterality Date  . CESAREAN SECTION      Allergies Review of patient's allergies indicates no known allergies.  Social History Social History  Substance Use Topics  . Smoking status: Never Smoker  . Smokeless tobacco: Not on file  . Alcohol use No    Review of Systems Constitutional: Negative for fever. Cardiovascular: Negative for chest pain. Respiratory: Negative for shortness of breath. Gastrointestinal: Positive for abdominal pain, Positive for rectal bleeding Genitourinary: Negative for dysuria. positive for vaginal bleeding Musculoskeletal: Negative for back pain. Skin: Negative for rash. Neurological: Negative for headaches, focal weakness or numbness.  10-point ROS otherwise negative.  ____________________________________________   PHYSICAL EXAM:  VITAL SIGNS: ED Triage Vitals [05/18/16 0912]  Enc Vitals Group     BP      Pulse      Resp      Temp      Temp src      SpO2      Weight 150 lb (68 kg)     Height 5\' 3"  (1.6 m)     Head Circumference      Peak Flow      Pain Score       Pain Loc      Pain Edu?      Excl. in GC?     Constitutional: Alert and oriented. Well appearing and in no distress. Eyes: Conjunctivae are normal. PERRL. Normal extraocular movements. ENT   Head: Normocephalic and atraumatic.   Nose: No congestion/rhinnorhea.   Mouth/Throat: Mucous membranes are moist.   Neck: No stridor. Cardiovascular: Normal rate, regular rhythm. No murmurs, rubs, or gallops. Respiratory: Normal respiratory effort without tachypnea nor retractions. Breath sounds are clear and equal bilaterally. No wheezes/rales/rhonchi. Gastrointestinal: Soft and nontender. Normal bowel sounds Rectal: Tender, no hemorrhoids, no blood, heme negative Genitourinary: White vaginal discharge, no vaginal bleeding Musculoskeletal: Nontender with normal range of motion in all extremities. No lower extremity tenderness nor edema. Neurologic:  Normal speech and language. No gross focal neurologic deficits are appreciated.  Skin:  Skin is warm, dry and intact. No rash noted. Psychiatric: Mood and affect are normal. Speech and behavior are normal.  ____________________________________________  ED COURSE:  Pertinent labs & imaging results that were available during my care of the patient were reviewed by me and considered in my medical decision making (see chart for details). Clinical Course  Patient presents to the ER in no distress, we will assess with basic labs and imaging.  Procedures ____________________________________________   LABS (pertinent positives/negatives)  Labs Reviewed  WET PREP, GENITAL - Abnormal; Notable for the following:       Result  Value   Trich, Wet Prep PRESENT (*)    WBC, Wet Prep HPF POC MANY (*)    All other components within normal limits  CBC WITH DIFFERENTIAL/PLATELET - Abnormal; Notable for the following:    Hemoglobin 11.8 (*)    HCT 33.7 (*)    All other components within normal limits  COMPREHENSIVE METABOLIC PANEL - Abnormal;  Notable for the following:    Albumin 3.4 (*)    ALT 10 (*)    All other components within normal limits  HCG, QUANTITATIVE, PREGNANCY - Abnormal; Notable for the following:    hCG, Beta Chain, Quant, S 35,985 (*)    All other components within normal limits  URINALYSIS COMPLETEWITH MICROSCOPIC ([facility] ONLY) - Abnormal; Notable for the following:    Color, Urine YELLOW (*)    APPearance HAZY (*)    Leukocytes, UA 2+ (*)    Bacteria, UA RARE (*)    Squamous Epithelial / LPF 6-30 (*)    All other components within normal limits  POCT PREGNANCY, URINE - Abnormal; Notable for the following:    Preg Test, Ur POSITIVE (*)    All other components within normal limits  CHLAMYDIA/NGC RT PCR ([facility] ONLY)  ABO/RH    RADIOLOGY  Pregnancy ultrasound IMPRESSION: Approximately 16 week intrauterine pregnancy. Fetal heart rate 160 beats per minute. No acute maternal findings.  This exam is performed on an emergent basis and does not comprehensively evaluate fetal size, dating, or anatomy; follow-up complete OB US should be considered if further fetal assessment is warranted.  ____________________________________________  FINAL ASSESSMENT AND PLAN  Threatened miscarriage, rectal bleeding, Trichomoniasis, UTI  Plan: Patient with labs and imaging as dictated above. No clear etiology for some of her symptoms. Ultrasound is unremarkable. She will be treated for Trichomonas as well as a UTI. She is stable for outpatient follow-up with her OB/GYN doctor.   Emily FilbertWilliams, Mouhamad Teed E, MD   Note: This dictation was prepared with Dragon dictation. Any transcriptional errors that result from this process are unintentional    Emily FilbertJonathan E Redell Nazir, MD 05/18/16 1120

## 2016-05-19 DIAGNOSIS — A599 Trichomoniasis, unspecified: Secondary | ICD-10-CM | POA: Insufficient documentation

## 2016-06-23 ENCOUNTER — Encounter: Payer: Self-pay | Admitting: *Deleted

## 2016-06-23 ENCOUNTER — Inpatient Hospital Stay
Admission: EM | Admit: 2016-06-23 | Discharge: 2016-06-23 | Disposition: A | Discharge status: Home / Self Care | Payer: BLUE CROSS/BLUE SHIELD | Attending: Obstetrics and Gynecology | Admitting: Obstetrics and Gynecology

## 2016-06-23 DIAGNOSIS — Z79899 Other long term (current) drug therapy: Secondary | ICD-10-CM | POA: Diagnosis not present

## 2016-06-23 DIAGNOSIS — Z3A21 21 weeks gestation of pregnancy: Secondary | ICD-10-CM | POA: Diagnosis present

## 2016-06-23 DIAGNOSIS — N898 Other specified noninflammatory disorders of vagina: Secondary | ICD-10-CM | POA: Diagnosis not present

## 2016-06-23 DIAGNOSIS — W19XXXA Unspecified fall, initial encounter: Secondary | ICD-10-CM | POA: Insufficient documentation

## 2016-06-23 DIAGNOSIS — O26892 Other specified pregnancy related conditions, second trimester: Secondary | ICD-10-CM | POA: Diagnosis not present

## 2016-06-23 DIAGNOSIS — Y92009 Unspecified place in unspecified non-institutional (private) residence as the place of occurrence of the external cause: Secondary | ICD-10-CM

## 2016-06-23 NOTE — Discharge Summary (Signed)
Obstetric Discharge Summary   Patient ID: Jill [name] MRN: [id number] DOB/AGE: [birth year] 28 y.o.  [facility]  Date of Admission: 06/23/2016  Date of Discharge:  06/23/16 Admitting Diagnosis:IUP at 21 2/7 weeks with fall at 0400 Secondary Diagnosis: Manson PasseyBrown vag dc    Discharge Diagnosis: IUP at 21 2/7 weeks with fall    Complications: Fall onto abd this am with brownish vag dc noted per pt x 1 on wiping, no bleeding noted   Brief Hospital Course  Jill HamburgShaquelah N [name] is a [id number] who had a fall at 0400 on 06/23/16. Pt had brown dc from vagina. None seen in [facility]. No UC's. No ROM, FHR 140.   Labs: CBC Latest Ref Rng & Units 05/18/2016 02/12/2016 10/13/2015  WBC 3.6 - 11.0 K/uL 6.5 6.3 6.1  Hemoglobin 12.0 - 16.0 g/dL 11.8(L) 11.1(L) 11.9(L)  Hematocrit 35.0 - 47.0 % 33.7(L) 33.5(L) 36.0  Platelets 150 - 440 K/uL 220 235 301   O POS  Physical exam:  Blood pressure (!) 103/5, pulse 82, temperature 98.1 F (36.7 C), temperature source Oral, resp. rate 16, height 5\' 3"  (1.6 m), weight 154 lb (69.9 kg), last menstrual period 01/26/2016. General: alert and no distress Heart: S1S2, RRR, no M/R/G.  Lungs: CTA bilat, no W/R./R Abdomen:Gravid Extremities: No evidence of DVT seen on physical exam. No lower extremity edema.  Discharge Instructions: Pelvic rest for now Activity: Advance as tolerated.   Also refer to After Visit Summary Diet: Regular Medications:   Medication List    STOP taking these medications   cyclobenzaprine 5 MG tablet Commonly known as:  FLEXERIL   docusate sodium 100 MG capsule Commonly known as:  COLACE   HYDROcodone-acetaminophen 5-325 MG tablet Commonly known as:  NORCO/VICODIN   metroNIDAZOLE 500 MG tablet Commonly known as:  FLAGYL   naproxen 500 MG EC tablet Commonly known as:  EC NAPROSYN   nitrofurantoin (macrocrystal-monohydrate) 100 MG capsule Commonly known as:  MACROBID   ondansetron 4 MG  disintegrating tablet Commonly known as:  ZOFRAN ODT      Outpatient follow up:  Discharged Condition: Stable  Discharged to: home  [facility].   Sharee Pimplearon W Abdikadir Fohl, CNM 06/23/2016

## 2016-06-23 NOTE — Discharge Instructions (Signed)

## 2016-06-23 NOTE — Progress Notes (Signed)
Discharge instructions reviewed with patient, patient verbalized understanding. In[facility].

## 2016-06-23 NOTE — OB Triage Note (Signed)
Pt. Present with c/o of "fall" this morning around 4:00 a.m.; also some vaginal bleeding, currently experiencing pain on right side of abd., pt. States  She hit her head on the counter when she fell.  Last sexual encounter was last night. No active bleeding at the moment.

## 2016-06-29 ENCOUNTER — Observation Stay: Payer: BLUE CROSS/BLUE SHIELD

## 2016-06-29 ENCOUNTER — Encounter: Payer: Self-pay | Admitting: *Deleted

## 2016-06-29 ENCOUNTER — Observation Stay
Admission: EM | Admit: 2016-06-29 | Discharge: 2016-06-29 | Disposition: A | Discharge status: Home / Self Care | Payer: BLUE CROSS/BLUE SHIELD | Attending: Obstetrics and Gynecology | Admitting: Obstetrics and Gynecology

## 2016-06-29 DIAGNOSIS — O4692 Antepartum hemorrhage, unspecified, second trimester: Principal | ICD-10-CM | POA: Diagnosis present

## 2016-06-29 DIAGNOSIS — Z3A22 22 weeks gestation of pregnancy: Secondary | ICD-10-CM | POA: Diagnosis not present

## 2016-06-29 DIAGNOSIS — W19XXXA Unspecified fall, initial encounter: Secondary | ICD-10-CM | POA: Insufficient documentation

## 2016-06-29 DIAGNOSIS — N939 Abnormal uterine and vaginal bleeding, unspecified: Secondary | ICD-10-CM | POA: Diagnosis present

## 2016-06-29 LAB — CBC
HCT: 30.1 % — ABNORMAL LOW (ref 35.0–47.0)
Hemoglobin: 10.3 g/dL — ABNORMAL LOW (ref 12.0–16.0)
MCH: 28.8 pg (ref 26.0–34.0)
MCHC: 34.3 g/dL (ref 32.0–36.0)
MCV: 84.2 fL (ref 80.0–100.0)
Platelets: 240 10*3/uL (ref 150–440)
RBC: 3.58 MIL/uL — ABNORMAL LOW (ref 3.80–5.20)
RDW: 13.4 % (ref 11.5–14.5)
WBC: 7.6 10*3/uL (ref 3.6–11.0)

## 2016-06-29 MED ORDER — ACETAMINOPHEN 325 MG PO TABS: 650.0000 mg | ORAL_TABLET | ORAL | Status: DC | PRN

## 2016-06-29 NOTE — Discharge Instructions (Signed)
Pelvic Rest °Pelvic rest is sometimes recommended for women when:  °· The placenta is partially or completely covering the opening of the cervix (placenta previa). °· There is bleeding between the uterine wall and the amniotic sac in the first trimester (subchorionic hemorrhage). °· The cervix begins to open without labor starting (incompetent cervix, cervical insufficiency). °· The labor is too early (preterm labor). °HOME CARE INSTRUCTIONS °· Do not have sexual intercourse, stimulation, or an orgasm. °· Do not use tampons, douche, or put anything in the vagina. °· Do not lift anything over 10 pounds (4.5 kg). °· Avoid strenuous activity or straining your pelvic muscles. °SEEK MEDICAL CARE IF:  °· You have any vaginal bleeding during pregnancy. Treat this as a potential emergency. °· You have cramping pain felt low in the stomach (stronger than menstrual cramps). °· You notice vaginal discharge (watery, mucus, or bloody). °· You have a low, dull backache. °· There are regular contractions or uterine tightening. °SEEK IMMEDIATE MEDICAL CARE IF: °You have vaginal bleeding and have placenta previa.  °  °This information is not intended to replace advice given to you by your health care provider. Make sure you discuss any questions you have with your health care provider. °  °Document Released: 12/25/2010 Document Revised: 11/22/2011 Document Reviewed: 03/03/2015 °Elsevier Interactive Patient Education ©2016 Elsevier Inc. ° °

## 2016-06-29 NOTE — Progress Notes (Signed)
Pt discharged home.  Discharge instructions and follow up appointment given to and reviewed with pt.  Pt verbalized understanding.  Escorted by auxillary. 

## 2016-06-29 NOTE — Discharge Summary (Signed)
  Jill [name] J Akiyah Eppolito, MD  Obstetrics    [] Hide copied text [] Hover for attribution information Patient ID: Jill HamburgShaquelah N [name], female   DOB: [birth year], [age] y.o.   MRN: [id number] Jill HamburgShaquelah N Odonovan [birth year] G6 P4 [id number] presents for vaginal bleeding for 1 day . S/P a fall 10/11 onto abdomen and was eval on L+D . Decreased fetal movt . No recent sexual activity . Marland Kitchen. No abd pain . U/s [facility] no previa noLOF , + vaginal bleeding , O;BP (!) 100/59 (BP Location: Left Arm)   Pulse 91   Temp 98.3 F (36.8 C) (Oral)   Resp 16   LMP 01/26/2016  ABD soft NT  CX closed / 25% , no blood on exam glove  NSTFHT 150  Labs:30.1 hct / plt 240K U/s done today , normal  No evidence of clot or abruption  A: Vaginal bleeding without evidence on  Exam glove P:nl u/s today  D/c home with precautions  [facility]     Electronically signed by Jill [name] J Ersel Enslin, MD at 06/29/2016 4:50 PM      ED to Hosp-Admission (Current) on 06/29/2016

## 2016-06-29 NOTE — Progress Notes (Signed)
  June 29, 2016  Patient: Jill [name]  Date of Birth: 11-Mar-1986  Date of Visit: 06/29/2016    To Whom It May Concern:  Jill [name] was seen and treated in our Labor and Delivery on 06/29/2016. Please excuse her from work 06/29/16.    Sincerely,  Harless LittenKendra Canesha Tesfaye, RN

## 2016-06-29 NOTE — Progress Notes (Signed)
Patient ID: Jill [name], female   DOB: Sep 18, 1985, 30 y.o.   MRN: [id number] Jill HamburgShaquelah N Soden Sep 18, 1985 G6 P4 3055w1d presents for vaginal bleeding for 1 day . S/P a fall 10/11 onto abdomen and was eval on L+D . Decreased fetal movt . No recent sexual activity . Marland Kitchen. No abd pain . U/s [facility] no previa noLOF , + vaginal bleeding , O;BP (!) 100/59 (BP Location: Left Arm)   Pulse 91   Temp 98.3 F (36.8 C) (Oral)   Resp 16   LMP 01/26/2016  ABD soft NT  CX closed / 25% , no blood on exam glove  NSTFHT 150  Labs:30.1 hct / plt 240K U/s done today , normal  No evidence of clot or abruption  A: Vaginal bleeding without evidence on  Exam glove P:nl u/s today  D/c home with precautions  [facility]

## 2016-06-29 NOTE — OB Triage Note (Addendum)
Pt. Here with complaints of vaginal bleeding, that started yesterday. Last sexual intercourse was one week ago. Pt. Also complain of decrease fetal movement.  (button given..to press when she feels baby move). Denies sudden gush of fluid.  Toco applied, abd. Soft; Doppler baby at 151 bpm.

## 2016-06-30 ENCOUNTER — Encounter: Payer: Self-pay | Admitting: *Deleted

## 2016-06-30 ENCOUNTER — Inpatient Hospital Stay
Admission: EM | Admit: 2016-06-30 | Discharge: 2016-06-30 | Disposition: A | Discharge status: Home / Self Care | Payer: BLUE CROSS/BLUE SHIELD | Attending: Obstetrics and Gynecology | Admitting: Obstetrics and Gynecology

## 2016-06-30 DIAGNOSIS — M545 Low back pain: Secondary | ICD-10-CM | POA: Diagnosis not present

## 2016-06-30 DIAGNOSIS — M5430 Sciatica, unspecified side: Secondary | ICD-10-CM | POA: Insufficient documentation

## 2016-06-30 DIAGNOSIS — R1030 Lower abdominal pain, unspecified: Secondary | ICD-10-CM | POA: Diagnosis not present

## 2016-06-30 DIAGNOSIS — N760 Acute vaginitis: Secondary | ICD-10-CM | POA: Insufficient documentation

## 2016-06-30 DIAGNOSIS — Z3A22 22 weeks gestation of pregnancy: Secondary | ICD-10-CM | POA: Insufficient documentation

## 2016-06-30 DIAGNOSIS — O26892 Other specified pregnancy related conditions, second trimester: Secondary | ICD-10-CM | POA: Diagnosis present

## 2016-06-30 HISTORY — DX: Headache: R51

## 2016-06-30 HISTORY — DX: Headache, unspecified: R51.9

## 2016-06-30 HISTORY — DX: Anemia, unspecified: D64.9

## 2016-06-30 LAB — URINE DRUG SCREEN, QUALITATIVE (ARMC ONLY)
Amphetamines, Ur Screen: NOT DETECTED
Barbiturates, Ur Screen: NOT DETECTED
Benzodiazepine, Ur Scrn: NOT DETECTED
Cannabinoid 50 Ng, Ur ~~LOC~~: NOT DETECTED
Cocaine Metabolite,Ur ~~LOC~~: NOT DETECTED
MDMA (Ecstasy)Ur Screen: NOT DETECTED
Methadone Scn, Ur: NOT DETECTED
Opiate, Ur Screen: NOT DETECTED
Phencyclidine (PCP) Ur S: NOT DETECTED
Tricyclic, Ur Screen: NOT DETECTED

## 2016-06-30 LAB — URINALYSIS COMPLETE WITH MICROSCOPIC (ARMC ONLY)
Bilirubin Urine: NEGATIVE
Glucose, UA: NEGATIVE mg/dL
Hgb urine dipstick: NEGATIVE
Ketones, ur: NEGATIVE mg/dL
Nitrite: NEGATIVE
Protein, ur: NEGATIVE mg/dL
Specific Gravity, Urine: 1.006 (ref 1.005–1.030)
pH: 6 (ref 5.0–8.0)

## 2016-06-30 LAB — COMPREHENSIVE METABOLIC PANEL
ALT: 8 U/L — ABNORMAL LOW (ref 14–54)
AST: 18 U/L (ref 15–41)
Albumin: 2.7 g/dL — ABNORMAL LOW (ref 3.5–5.0)
Alkaline Phosphatase: 32 U/L — ABNORMAL LOW (ref 38–126)
Anion gap: 7 (ref 5–15)
BUN: 6 mg/dL (ref 6–20)
CO2: 23 mmol/L (ref 22–32)
Calcium: 8.5 mg/dL — ABNORMAL LOW (ref 8.9–10.3)
Chloride: 107 mmol/L (ref 101–111)
Creatinine, Ser: 0.72 mg/dL (ref 0.44–1.00)
GFR calc Af Amer: >60 mL/min (ref >60)
GFR calc non Af Amer: >60 mL/min (ref >60)
Glucose, Bld: 118 mg/dL — ABNORMAL HIGH (ref 65–99)
Potassium: 3.5 mmol/L (ref 3.5–5.1)
Sodium: 137 mmol/L (ref 135–145)
Total Bilirubin: 0.6 mg/dL (ref 0.3–1.2)
Total Protein: 5.6 g/dL — ABNORMAL LOW (ref 6.5–8.1)

## 2016-06-30 LAB — LIPASE, BLOOD: Lipase: 30 U/L (ref 11–51)

## 2016-06-30 LAB — CHLAMYDIA/NGC RT PCR (ARMC ONLY)
Chlamydia Tr: NOT DETECTED
N gonorrhoeae: NOT DETECTED

## 2016-06-30 LAB — WET PREP, GENITAL
Sperm: NONE SEEN
Trich, Wet Prep: NONE SEEN
Yeast Wet Prep HPF POC: NONE SEEN

## 2016-06-30 LAB — AMYLASE: Amylase: 72 U/L (ref 28–100)

## 2016-06-30 MED ORDER — CYCLOBENZAPRINE HCL 5 MG PO TABS: 5.0000 mg | ORAL_TABLET | Freq: Two times a day (BID) | ORAL | 0 refills | Status: DC | PRN

## 2016-06-30 MED ORDER — ACETAMINOPHEN 500 MG PO TABS
1000.0000 mg | ORAL_TABLET | Freq: Once | ORAL | Status: AC
  Administered 2016-06-30: 1000 mg via ORAL
  Filled 2016-06-30: qty 2

## 2016-06-30 MED ORDER — METRONIDAZOLE 0.75 % VA GEL: 1.0000 | Freq: Every day | VAGINAL | 0 refills | Status: AC

## 2016-06-30 NOTE — Discharge Instructions (Signed)
Metronidazole vaginal gel What is this medicine? METRONIDAZOLE (me troe NI da zole) VAGINAL GEL is an antiinfective. It is used to treat bacterial vaginitis. This medicine may be used for other purposes; ask your health care provider or pharmacist if you have questions. What should I tell my health care provider before I take this medicine? They need to know if you have any of these conditions: -if you drink alcohol containing drinks -if you have taken disulfiram in the past two weeks -liver disease -peripheral neuropathy -seizures -an unusual or allergic reaction to metronidazole, parabens, nitroimidazoles, or other medicines, foods, dyes, or preservatives -pregnant or trying to get pregnant -breast-feeding How should I use this medicine? This medicine is only for use in the vagina. Do not take by mouth or apply to other areas of the body. Follow the directions on the prescription label. Wash hands before and after use. Screw the applicator to the tube and squeeze the tube gently to fill the applicator. Lie on your back, part and bend your knees. Insert the applicator tip high in the vagina and push the plunger to release the gel into the vagina. Gently remove the applicator. Wash the applicator well with warm water and soap. Use at regular intervals. Finish the full course prescribed by your doctor or health care professional even if you think your condition is better. Do not stop using except on the advice of your doctor or health care professional. Talk to your pediatrician regarding the use of this medicine in children. Special care may be needed. Overdosage: If you think you have taken too much of this medicine contact a poison control center or emergency room at once. NOTE: This medicine is only for you. Do not share this medicine with others. What if I miss a dose? If you miss a dose, use it as soon as you can. If it is almost time for your next dose, use only that dose. Do not use double  or extra doses. What may interact with this medicine? Do not take this medicine with any of the following medications: -alcohol or any product that contains alcohol -cisapride -dofetilide -dronedarone -pimozide -thioridazine -ziprasidone This medicine may also interact with the following medications: -cimetidine -lithium -other medicines that prolong the QT interval (cause an abnormal heart rhythm) -warfarin This list may not describe all possible interactions. Give your health care provider a list of all the medicines, herbs, non-prescription drugs, or dietary supplements you use. Also tell them if you smoke, drink alcohol, or use illegal drugs. Some items may interact with your medicine. What should I watch for while using this medicine? Tell your doctor or health care professional if your symptoms do not start to get better in 2 or 3 days. Avoid alcoholic drinks while you are taking this medicine and for three days afterwards. Alcohol may make you feel dizzy, sick, or flushed. You may get drowsy or dizzy. Do not drive, use machinery, or do anything that needs mental alertness until you know how this medicine affects you. To reduce the risk of dizzy or fainting spells, do not sit or stand up quickly, especially if you are an older patient. Your clothing may get soiled if you have a vaginal discharge. You can wear a sanitary napkin. Do not use tampons. Wear freshly washed cotton, not synthetic, panties. Do not have sex until you have finished your treatment. Having sex can make the treatment less effective. Your sexual partner may also need treatment. What side effects may I  notice from receiving this medicine? Side effects that you should report to your doctor or health care professional as soon as possible: -dizziness -frequent passing of urine -headache -loss of appetite -nausea -skin rash, itching -stomach pain or cramps -vaginal irritation or discharge -vulvar burning or  swelling Side effects that usually do not require medical attention (report to your doctor or health care professional if they continue or are bothersome): -dark urine -mild vaginal burning This list may not describe all possible side effects. Call your doctor for medical advice about side effects. You may report side effects to FDA at 1-800-FDA-1088. Where should I keep my medicine? Keep out of the reach of children. Store at room temperature between 15 and 30 degrees C (59 and 86 degrees F). Do not freeze. Throw away any unused medicine after the expiration date. NOTE: This sheet is a summary. It may not cover all possible information. If you have questions about this medicine, talk to your doctor, pharmacist, or health care provider.    2016, Elsevier/Gold Standard. (2013-04-06 14:09:23)  Cyclobenzaprine tablets What is this medicine? CYCLOBENZAPRINE (sye kloe BEN za preen) is a muscle relaxer. It is used to treat muscle pain, spasms, and stiffness. This medicine may be used for other purposes; ask your health care provider or pharmacist if you have questions. What should I tell my health care provider before I take this medicine? They need to know if you have any of these conditions: -heart disease, irregular heartbeat, or previous heart attack -liver disease -thyroid problem -an unusual or allergic reaction to cyclobenzaprine, tricyclic antidepressants, lactose, other medicines, foods, dyes, or preservatives -pregnant or trying to get pregnant -breast-feeding How should I use this medicine? Take this medicine by mouth with a glass of water. Follow the directions on the prescription label. If this medicine upsets your stomach, take it with food or milk. Take your medicine at regular intervals. Do not take it more often than directed. Talk to your pediatrician regarding the use of this medicine in children. Special care may be needed. Overdosage: If you think you have taken too much of  this medicine contact a poison control center or emergency room at once. NOTE: This medicine is only for you. Do not share this medicine with others. What if I miss a dose? If you miss a dose, take it as soon as you can. If it is almost time for your next dose, take only that dose. Do not take double or extra doses. What may interact with this medicine? Do not take this medicine with any of the following medications: -certain medicines for fungal infections like fluconazole, itraconazole, ketoconazole, posaconazole, voriconazole -cisapride -dofetilide -dronedarone -droperidol -flecainide -grepafloxacin -halofantrine -levomethadyl -MAOIs like Carbex, Eldepryl, Marplan, Nardil, and Parnate -nilotinib -pimozide -probucol -sertindole -thioridazine -ziprasidone This medicine may also interact with the following medications: -abarelix -alcohol -certain medicines for cancer -certain medicines for depression, anxiety, or psychotic disturbances -certain medicines for infection like alfuzosin, chloroquine, clarithromycin, levofloxacin, mefloquine, pentamidine, troleandomycin -certain medicines for an irregular heart beat -certain medicines used for sleep or numbness during surgery or procedure -contrast dyes -dolasetron -guanethidine -methadone -octreotide -ondansetron -other medicines that prolong the QT interval (cause an abnormal heart rhythm) -palonosetron -phenothiazines like chlorpromazine, mesoridazine, prochlorperazine, thioridazine -tramadol -vardenafil This list may not describe all possible interactions. Give your health care provider a list of all the medicines, herbs, non-prescription drugs, or dietary supplements you use. Also tell them if you smoke, drink alcohol, or use illegal drugs. Some items may interact  with your medicine. What should I watch for while using this medicine? Check with your doctor or health care professional if your condition does not improve  within 1 to 3 weeks. You may get drowsy or dizzy when you first start taking the medicine or change doses. Do not drive, use machinery, or do anything that may be dangerous until you know how the medicine affects you. Stand or sit up slowly. Your mouth may get dry. Drinking water, chewing sugarless gum, or sucking on hard candy may help. What side effects may I notice from receiving this medicine? Side effects that you should report to your doctor or health care professional as soon as possible: -allergic reactions like skin rash, itching or hives, swelling of the face, lips, or tongue -chest pain -fast heartbeat -hallucinations -seizures -vomiting Side effects that usually do not require medical attention (report to your doctor or health care professional if they continue or are bothersome): -headache This list may not describe all possible side effects. Call your doctor for medical advice about side effects. You may report side effects to FDA at 1-800-FDA-1088. Where should I keep my medicine? Keep out of the reach of children. Store at room temperature between 15 and 30 degrees C (59 and 86 degrees F). Keep container tightly closed. Throw away any unused medicine after the expiration date. NOTE: This sheet is a summary. It may not cover all possible information. If you have questions about this medicine, talk to your doctor, pharmacist, or health care provider.    2016, Elsevier/Gold Standard. (2013-03-27 12:48:19)    LABOR: When contractions begin, you should start to time them from the beginning of one contraction to the beginning of the next.  When contractions are 5-10 minutes apart or less and have been regular for at least an hour, you should call your health care provider.  Notify your doctor if any of the following occur: 1. Bleeding from the vagina 7. Sudden, constant, or occasional abdominal pain  2. Pain or burning when urinating 8. Sudden gushing of fluid from the vagina  (with or without continued leaking)  3. Chills or fever 9. Fainting spells, "black outs" or loss of consciousness  4. Increase in vaginal discharge 10. Severe or continued nausea or vomiting  5. Pelvic pressure (sudden increase) 11. Blurring of vision or spots before the eyes  6. Baby moving less than usual 12. Leaking of fluid    FETAL KICK COUNT: Lie on your left side for one hour after a meal, and count the number of times your baby kicks. If it is less than 5 times, get up, move around and drink some juice. Repeat the test 30 minutes later. If it is still less than 5 kicks in an hour, notify your doctor.

## 2016-06-30 NOTE — Progress Notes (Signed)
TRIAGE VISIT   Jill [name] is a 30 y.o. [id number]. She is at [redacted]w[redacted]d gestation, presenting with lower abdominal cramping, and mid to low back pain that "shoots down to my buttock."  She also reports a severe headache. She was seen here yesterday with c/o of vaginal bleeding and decreased fetal movement.  She was not found to have any active bleeding and discharged home.  She is a previous LTCS x 4 and reports they were all performed by Dr. Feliberto Gottron.  She endorses fetal movement.  She denies VB, LOF, and dysuria, and abnormal discharge.  She does report dry heaving 6-9 times and diarrhea today.  She normally has chronic constipation.  She states she has not eaten anything since yesterday. She states her back pain is a 10/10 pain, but does not appear in acute distress.   She has received spotty PNC at [facility], but states she has a hard time getting to her appointments due to transportation issues.    On 10/4: tx for trichomonas and needs TOC  Hx. Of pyelonephritis - on Macrobid suppression therapy  OB hx: hx of LTCS x 4, hx of postpartum hemorrhage  Hx. Of Migraines - was on Topamax outside of pregnancy   Social hx:  States she lives by herself with her 4 children.  She reports good support and states she walked her children down to her grandfather's house.  Her mother has transportation, but the patient does not.  She took public transportation here.  She reports the FOB is not involved.  She denies physical, verbal, or sexual abuse.  She does work in [place] and states she has to lift 40 pounds.  She reports she does have resources to provide adequate food for herself and family, but states she does not have transportation.    O:  BP (!) 97/43 (BP Location: Right Arm)   Pulse (!) 113   Temp 98.5 F (36.9 C) (Oral)   Resp 16   Ht 5\' 3"  (1.6 m)   Wt 69.9 kg (154 lb)   LMP 01/26/2016   BMI 27.28 kg/m  Results for orders placed or performed during the hospital encounter of 06/30/16 (from  the past 48 hour(s))  Chlamydia/NGC rt PCR ([facility] only)   Collection Time: 06/30/16  3:22 PM  Result Value Ref Range   Specimen source GC/Chlam URINE, RANDOM    Chlamydia Tr NOT DETECTED NOT DETECTED   N gonorrhoeae NOT DETECTED NOT DETECTED  Urinalysis complete, with microscopic ([facility] only)   Collection Time: 06/30/16  3:22 PM  Result Value Ref Range   Color, Urine YELLOW (A) YELLOW   APPearance HAZY (A) CLEAR   Glucose, UA NEGATIVE NEGATIVE mg/dL   Bilirubin Urine NEGATIVE NEGATIVE   Ketones, ur NEGATIVE NEGATIVE mg/dL   Specific Gravity, Urine 1.006 1.005 - 1.030   Hgb urine dipstick NEGATIVE NEGATIVE   pH 6.0 5.0 - 8.0   Protein, ur NEGATIVE NEGATIVE mg/dL   Nitrite NEGATIVE NEGATIVE   Leukocytes, UA TRACE (A) NEGATIVE   RBC / HPF 0-5 0 - 5 RBC/hpf   WBC, UA 0-5 0 - 5 WBC/hpf   Bacteria, UA RARE (A) NONE SEEN   Squamous Epithelial / LPF 6-30 (A) NONE SEEN  Urine Drug Screen, Qualitative ([facility] only)   Collection Time: 06/30/16  3:22 PM  Result Value Ref Range   Tricyclic, Ur Screen NONE DETECTED NONE DETECTED   Amphetamines, Ur Screen NONE DETECTED NONE DETECTED   MDMA (Ecstasy)Ur Screen NONE DETECTED NONE  DETECTED   Cocaine Metabolite,Ur [place] NONE DETECTED NONE DETECTED   Opiate, Ur Screen NONE DETECTED NONE DETECTED   Phencyclidine (PCP) Ur S NONE DETECTED NONE DETECTED   Cannabinoid 50 Ng, Ur [place] NONE DETECTED NONE DETECTED   Barbiturates, Ur Screen NONE DETECTED NONE DETECTED   Benzodiazepine, Ur Scrn NONE DETECTED NONE DETECTED   Methadone Scn, Ur NONE DETECTED NONE DETECTED  Wet prep, genital   Collection Time: 06/30/16  6:36 PM  Result Value Ref Range   Yeast Wet Prep HPF POC NONE SEEN NONE SEEN   Trich, Wet Prep NONE SEEN NONE SEEN   Clue Cells Wet Prep HPF POC PRESENT (A) NONE SEEN   WBC, Wet Prep HPF POC MODERATE (A) NONE SEEN   Sperm NONE SEEN   Comprehensive metabolic panel   Collection Time: 06/30/16  7:59 PM  Result Value Ref Range   Sodium 137 135 - 145  mmol/L   Potassium 3.5 3.5 - 5.1 mmol/L   Chloride 107 101 - 111 mmol/L   CO2 23 22 - 32 mmol/L   Glucose, Bld 118 (H) 65 - 99 mg/dL   BUN 6 6 - 20 mg/dL   Creatinine, Ser 1.610.72 0.44 - 1.00 mg/dL   Calcium 8.5 (L) 8.9 - 10.3 mg/dL   Total Protein 5.6 (L) 6.5 - 8.1 g/dL   Albumin 2.7 (L) 3.5 - 5.0 g/dL   AST 18 15 - 41 U/L   ALT 8 (L) 14 - 54 U/L   Alkaline Phosphatase 32 (L) 38 - 126 U/L   Total Bilirubin 0.6 0.3 - 1.2 mg/dL   GFR calc non Af Amer >60 >60 mL/min   GFR calc Af Amer >60 >60 mL/min   Anion gap 7 5 - 15  Amylase   Collection Time: 06/30/16  7:59 PM  Result Value Ref Range   Amylase 72 28 - 100 U/L  Lipase, blood   Collection Time: 06/30/16  7:59 PM  Result Value Ref Range   Lipase 30 11 - 51 U/L  Results for orders placed or performed during the hospital encounter of 06/29/16 (from the past 48 hour(s))  CBC   Collection Time: 06/29/16 10:27 AM  Result Value Ref Range   WBC 7.6 3.6 - 11.0 K/uL   RBC 3.58 (L) 3.80 - 5.20 MIL/uL   Hemoglobin 10.3 (L) 12.0 - 16.0 g/dL   HCT 09.630.1 (L) 04.535.0 - 40.947.0 %   MCV 84.2 80.0 - 100.0 fL   MCH 28.8 26.0 - 34.0 pg   MCHC 34.3 32.0 - 36.0 g/dL   RDW 81.113.4 91.411.5 - 78.214.5 %   Platelets 240 150 - 440 K/uL     Gen: NAD, AAOx3      Abd: FNTTP  Uterus: gravid, non-tender, +mild tenderness over prior C/S scar Back: generalized tenderness initially, but resolved soon after (appeared to be anxiety/reflex)     Ext: Non-tender, Nonedmeatous    NST/FHT: 145 bpm TOCO: quiet NFA:OZHYQMVHSVE:Dilation: Closed Cervical Position: Posterior Exam by:: Sigmon, CNM  NST: Reactive. See FHT above for particulars.  A/P:  30 y.o. [id number] 6180w2d with abdominal cramping, n/v, sciatic nerve pain, headache - resolved   Labor: not present.   Wet prep: +clue cells  Other labs - benign  Back pain improving and HA resolved with Tylenol 1gram   Bacterial vaginosis - Metrogel nightly x 5 nights - rx given  Sciatic nerve pain - Flexeril 5mg  BID PRN for pain - rx  given  Advised to wear a belly  support band   Preterm labor precautions and warning s/s reviewed  Pt.'s mom here to pick her up  Advised to call tomorrow to transfer care to [facility] since it is closer   D/c home stable, precautions reviewed, follow-up as scheduled.   Dr. Dalbert Garnet aware and agrees with plan  Carlean Jews, CNM

## 2016-06-30 NOTE — OB Triage Note (Signed)
C/O abdominal cramping. Was seen here yesterday. Came back today with c/o worsening cramping starting approx 0300 this am. Denies LOF. Denies any bleeding currently. Last intercourse was "about 2 weeks ago." Reports resting and hydrating with no relief. Also c/o frontal HA since 0400 this am. Last food intake was 2300 last night. Elaina HoopsElks, Roselene Gray S

## 2016-07-01 DIAGNOSIS — O26892 Other specified pregnancy related conditions, second trimester: Secondary | ICD-10-CM | POA: Diagnosis not present

## 2016-07-01 NOTE — Final Progress Note (Signed)
TRIAGE VISIT   Jill [name] is a 30 y.o. G6P3014. She is at [redacted]w[redacted]d gestation, presenting with lower abdominal cramping, and mid to low back pain that "shoots down to my buttock."  She also reports a severe headache. She was seen here yesterday with c/o of vaginal bleeding and decreased fetal movement.  She was not found to have any active bleeding and discharged home.  She is a previous LTCS x 4 and reports they were all performed by Dr. Schermerhorn.  She endorses fetal movement.  She denies VB, LOF, and dysuria, and abnormal discharge.  She does report dry heaving 6-9 times and diarrhea today.  She normally has chronic constipation.  She states she has not eaten anything since yesterday. She states her back pain is a 10/10 pain, but does not appear in acute distress.   She has received spotty PNC at [facility], but states she has a hard time getting to her appointments due to transportation issues.    On 10/4: tx for trichomonas and needs TOC  Hx. Of pyelonephritis - on Macrobid suppression therapy  OB hx: hx of LTCS x 4, hx of postpartum hemorrhage  Hx. Of Migraines - was on Topamax outside of pregnancy   Social hx:  States she lives by herself with her 4 children.  She reports good support and states she walked her children down to her grandfather's house.  Her mother has transportation, but the patient does not.  She took public transportation here.  She reports the FOB is not involved.  She denies physical, verbal, or sexual abuse.  She does work in [place] and states she has to lift 40 pounds.  She reports she does have resources to provide adequate food for herself and family, but states she does not have transportation.    O:  BP (!) 97/43 (BP Location: Right Arm)   Pulse (!) 113   Temp 98.5 F (36.9 C) (Oral)   Resp 16   Ht 5' 3" (1.6 m)   Wt 69.9 kg (154 lb)   LMP 01/26/2016   BMI 27.28 kg/m  Results for orders placed or performed during the hospital encounter of 06/30/16 (from  the past 48 hour(s))  Chlamydia/NGC rt PCR ([facility] only)   Collection Time: 06/30/16  3:22 PM  Result Value Ref Range   Specimen source GC/Chlam URINE, RANDOM    Chlamydia Tr NOT DETECTED NOT DETECTED   N gonorrhoeae NOT DETECTED NOT DETECTED  Urinalysis complete, with microscopic ([facility] only)   Collection Time: 06/30/16  3:22 PM  Result Value Ref Range   Color, Urine YELLOW (A) YELLOW   APPearance HAZY (A) CLEAR   Glucose, UA NEGATIVE NEGATIVE mg/dL   Bilirubin Urine NEGATIVE NEGATIVE   Ketones, ur NEGATIVE NEGATIVE mg/dL   Specific Gravity, Urine 1.006 1.005 - 1.030   Hgb urine dipstick NEGATIVE NEGATIVE   pH 6.0 5.0 - 8.0   Protein, ur NEGATIVE NEGATIVE mg/dL   Nitrite NEGATIVE NEGATIVE   Leukocytes, UA TRACE (A) NEGATIVE   RBC / HPF 0-5 0 - 5 RBC/hpf   WBC, UA 0-5 0 - 5 WBC/hpf   Bacteria, UA RARE (A) NONE SEEN   Squamous Epithelial / LPF 6-30 (A) NONE SEEN  Urine Drug Screen, Qualitative ([facility] only)   Collection Time: 06/30/16  3:22 PM  Result Value Ref Range   Tricyclic, Ur Screen NONE DETECTED NONE DETECTED   Amphetamines, Ur Screen NONE DETECTED NONE DETECTED   MDMA (Ecstasy)Ur Screen NONE DETECTED NONE   DETECTED   Cocaine Metabolite,Ur [place] NONE DETECTED NONE DETECTED   Opiate, Ur Screen NONE DETECTED NONE DETECTED   Phencyclidine (PCP) Ur S NONE DETECTED NONE DETECTED   Cannabinoid 50 Ng, Ur [place] NONE DETECTED NONE DETECTED   Barbiturates, Ur Screen NONE DETECTED NONE DETECTED   Benzodiazepine, Ur Scrn NONE DETECTED NONE DETECTED   Methadone Scn, Ur NONE DETECTED NONE DETECTED  Wet prep, genital   Collection Time: 06/30/16  6:36 PM  Result Value Ref Range   Yeast Wet Prep HPF POC NONE SEEN NONE SEEN   Trich, Wet Prep NONE SEEN NONE SEEN   Clue Cells Wet Prep HPF POC PRESENT (A) NONE SEEN   WBC, Wet Prep HPF POC MODERATE (A) NONE SEEN   Sperm NONE SEEN   Comprehensive metabolic panel   Collection Time: 06/30/16  7:59 PM  Result Value Ref Range   Sodium 137 135 - 145  mmol/L   Potassium 3.5 3.5 - 5.1 mmol/L   Chloride 107 101 - 111 mmol/L   CO2 23 22 - 32 mmol/L   Glucose, Bld 118 (H) 65 - 99 mg/dL   BUN 6 6 - 20 mg/dL   Creatinine, Ser 0.72 0.44 - 1.00 mg/dL   Calcium 8.5 (L) 8.9 - 10.3 mg/dL   Total Protein 5.6 (L) 6.5 - 8.1 g/dL   Albumin 2.7 (L) 3.5 - 5.0 g/dL   AST 18 15 - 41 U/L   ALT 8 (L) 14 - 54 U/L   Alkaline Phosphatase 32 (L) 38 - 126 U/L   Total Bilirubin 0.6 0.3 - 1.2 mg/dL   GFR calc non Af Amer >60 >60 mL/min   GFR calc Af Amer >60 >60 mL/min   Anion gap 7 5 - 15  Amylase   Collection Time: 06/30/16  7:59 PM  Result Value Ref Range   Amylase 72 28 - 100 U/L  Lipase, blood   Collection Time: 06/30/16  7:59 PM  Result Value Ref Range   Lipase 30 11 - 51 U/L  Results for orders placed or performed during the hospital encounter of 06/29/16 (from the past 48 hour(s))  CBC   Collection Time: 06/29/16 10:27 AM  Result Value Ref Range   WBC 7.6 3.6 - 11.0 K/uL   RBC 3.58 (L) 3.80 - 5.20 MIL/uL   Hemoglobin 10.3 (L) 12.0 - 16.0 g/dL   HCT 30.1 (L) 35.0 - 47.0 %   MCV 84.2 80.0 - 100.0 fL   MCH 28.8 26.0 - 34.0 pg   MCHC 34.3 32.0 - 36.0 g/dL   RDW 13.4 11.5 - 14.5 %   Platelets 240 150 - 440 K/uL     Gen: NAD, AAOx3      Abd: FNTTP  Uterus: gravid, non-tender, +mild tenderness over prior C/S scar Back: generalized tenderness initially, but resolved soon after (appeared to be anxiety/reflex)     Ext: Non-tender, Nonedmeatous    NST/FHT: 145 bpm TOCO: quiet SVE:Dilation: Closed Cervical Position: Posterior Exam by:: Purvis Sidle, CNM  NST: Reactive. See FHT above for particulars.  A/P:  30 y.o. G6P3014 [redacted]w[redacted]d with abdominal cramping, n/v, sciatic nerve pain, headache - resolved   Labor: not present.   Wet prep: +clue cells  Other labs - benign  Back pain improving and HA resolved with Tylenol 1gram   Bacterial vaginosis - Metrogel nightly x 5 nights - rx given  Sciatic nerve pain - Flexeril 5mg BID PRN for pain - rx  given  Advised to wear a belly   support band   Preterm labor precautions and warning s/s reviewed  Pt.'s mom here to pick her up  Advised to call tomorrow to transfer care to [facility] since it is closer   D/c home stable, precautions reviewed, follow-up as scheduled.   Dr. Beasley aware and agrees with plan  Karren Newland, CNM   

## 2016-07-14 DIAGNOSIS — O99019 Anemia complicating pregnancy, unspecified trimester: Secondary | ICD-10-CM | POA: Insufficient documentation

## 2016-08-11 LAB — OB RESULTS CONSOLE HIV ANTIBODY (ROUTINE TESTING): HIV: NONREACTIVE

## 2016-09-06 DIAGNOSIS — O99810 Abnormal glucose complicating pregnancy: Secondary | ICD-10-CM | POA: Insufficient documentation

## 2016-10-07 ENCOUNTER — Inpatient Hospital Stay: Payer: Medicaid Other | Admitting: Anesthesiology

## 2016-10-07 ENCOUNTER — Encounter
Admission: EM | Disposition: A | Discharge status: Home / Self Care | Payer: Self-pay | Source: Home / Self Care | Attending: Obstetrics and Gynecology

## 2016-10-07 ENCOUNTER — Inpatient Hospital Stay
Admission: EM | Admit: 2016-10-07 | Discharge: 2016-10-12 | DRG: 765 | Disposition: A | Discharge status: Home / Self Care | Payer: Medicaid Other | Attending: Obstetrics and Gynecology | Admitting: Obstetrics and Gynecology

## 2016-10-07 ENCOUNTER — Encounter: Payer: Self-pay | Admitting: Anesthesiology

## 2016-10-07 DIAGNOSIS — O26833 Pregnancy related renal disease, third trimester: Secondary | ICD-10-CM | POA: Diagnosis present

## 2016-10-07 DIAGNOSIS — G40909 Epilepsy, unspecified, not intractable, without status epilepticus: Secondary | ICD-10-CM | POA: Diagnosis present

## 2016-10-07 DIAGNOSIS — D62 Acute posthemorrhagic anemia: Secondary | ICD-10-CM | POA: Diagnosis not present

## 2016-10-07 DIAGNOSIS — Z9889 Other specified postprocedural states: Secondary | ICD-10-CM

## 2016-10-07 DIAGNOSIS — Y92231 Patient bathroom in hospital as the place of occurrence of the external cause: Secondary | ICD-10-CM | POA: Diagnosis present

## 2016-10-07 DIAGNOSIS — Z3A36 36 weeks gestation of pregnancy: Secondary | ICD-10-CM

## 2016-10-07 DIAGNOSIS — O99354 Diseases of the nervous system complicating childbirth: Secondary | ICD-10-CM | POA: Diagnosis present

## 2016-10-07 DIAGNOSIS — O9081 Anemia of the puerperium: Secondary | ICD-10-CM | POA: Diagnosis not present

## 2016-10-07 DIAGNOSIS — W1839XA Other fall on same level, initial encounter: Secondary | ICD-10-CM | POA: Diagnosis present

## 2016-10-07 DIAGNOSIS — R55 Syncope and collapse: Secondary | ICD-10-CM

## 2016-10-07 DIAGNOSIS — O34211 Maternal care for low transverse scar from previous cesarean delivery: Secondary | ICD-10-CM | POA: Diagnosis present

## 2016-10-07 DIAGNOSIS — R569 Unspecified convulsions: Secondary | ICD-10-CM | POA: Diagnosis not present

## 2016-10-07 DIAGNOSIS — N189 Chronic kidney disease, unspecified: Secondary | ICD-10-CM | POA: Diagnosis present

## 2016-10-07 DIAGNOSIS — O368390 Maternal care for abnormalities of the fetal heart rate or rhythm, unspecified trimester, not applicable or unspecified: Secondary | ICD-10-CM | POA: Diagnosis present

## 2016-10-07 DIAGNOSIS — Z87891 Personal history of nicotine dependence: Secondary | ICD-10-CM

## 2016-10-07 DIAGNOSIS — Z3490 Encounter for supervision of normal pregnancy, unspecified, unspecified trimester: Secondary | ICD-10-CM

## 2016-10-07 LAB — CBC
HCT: 28.7 % — ABNORMAL LOW (ref 35.0–47.0)
Hemoglobin: 9.4 g/dL — ABNORMAL LOW (ref 12.0–16.0)
MCH: 24.5 pg — ABNORMAL LOW (ref 26.0–34.0)
MCHC: 32.9 g/dL (ref 32.0–36.0)
MCV: 74.4 fL — ABNORMAL LOW (ref 80.0–100.0)
Platelets: 270 10*3/uL (ref 150–440)
RBC: 3.85 MIL/uL (ref 3.80–5.20)
RDW: 15.6 % — ABNORMAL HIGH (ref 11.5–14.5)
WBC: 8.4 10*3/uL (ref 3.6–11.0)

## 2016-10-07 SURGERY — Surgical Case
Anesthesia: General | Site: Abdomen | Wound class: Clean Contaminated

## 2016-10-07 MED ORDER — ONDANSETRON HCL 4 MG/2ML IJ SOLN: 4.0000 mg | Freq: Four times a day (QID) | INTRAMUSCULAR | Status: DC | PRN

## 2016-10-07 MED ORDER — METHYLERGONOVINE MALEATE 0.2 MG/ML IJ SOLN
INTRAMUSCULAR | Status: AC
  Filled 2016-10-07: qty 1

## 2016-10-07 MED ORDER — COCONUT OIL OIL: 1.0000 "application " | TOPICAL_OIL | Status: DC | PRN

## 2016-10-07 MED ORDER — LACTATED RINGERS IV SOLN
500.0000 mL | INTRAVENOUS | Status: DC | PRN
Start: 2016-10-07 — End: 2016-10-08

## 2016-10-07 MED ORDER — SODIUM CHLORIDE 0.9 % IV SOLN
INTRAVENOUS | Status: DC | PRN
  Administered 2016-10-07: 40 ug/min via INTRAVENOUS

## 2016-10-07 MED ORDER — NALOXONE HCL 0.4 MG/ML IJ SOLN: 0.4000 mg | INTRAMUSCULAR | Status: DC | PRN

## 2016-10-07 MED ORDER — SOD CITRATE-CITRIC ACID 500-334 MG/5ML PO SOLN
ORAL | Status: AC
  Administered 2016-10-07: 30 mL
  Filled 2016-10-07: qty 15

## 2016-10-07 MED ORDER — OXYTOCIN 40 UNITS IN LACTATED RINGERS INFUSION - SIMPLE MED
INTRAVENOUS | Status: AC
  Filled 2016-10-07: qty 1000

## 2016-10-07 MED ORDER — ONDANSETRON HCL 4 MG/2ML IJ SOLN
4.0000 mg | Freq: Three times a day (TID) | INTRAMUSCULAR | Status: DC | PRN
  Administered 2016-10-10: 4 mg via INTRAVENOUS
  Filled 2016-10-07 (×2): qty 2

## 2016-10-07 MED ORDER — DIPHENHYDRAMINE HCL 50 MG/ML IJ SOLN: 12.5000 mg | INTRAMUSCULAR | Status: DC | PRN

## 2016-10-07 MED ORDER — OXYTOCIN BOLUS FROM INFUSION: 500.0000 mL | Freq: Once | INTRAVENOUS | Status: DC

## 2016-10-07 MED ORDER — ONDANSETRON HCL 4 MG/2ML IJ SOLN
INTRAMUSCULAR | Status: DC | PRN
  Administered 2016-10-07: 4 mg via INTRAVENOUS

## 2016-10-07 MED ORDER — PHENYLEPHRINE HCL 10 MG/ML IJ SOLN
INTRAMUSCULAR | Status: DC | PRN
  Administered 2016-10-07: 200 ug via INTRAVENOUS
  Administered 2016-10-07: 100 ug via INTRAVENOUS
  Administered 2016-10-07: 40 ug via INTRAVENOUS
  Administered 2016-10-07: 100 ug via INTRAVENOUS

## 2016-10-07 MED ORDER — SCOPOLAMINE 1 MG/3DAYS TD PT72
1.0000 | MEDICATED_PATCH | Freq: Once | TRANSDERMAL | Status: AC
  Administered 2016-10-07: 1.5 mg via TRANSDERMAL
  Filled 2016-10-07: qty 1

## 2016-10-07 MED ORDER — WITCH HAZEL-GLYCERIN EX PADS: 1.0000 "application " | MEDICATED_PAD | CUTANEOUS | Status: DC | PRN

## 2016-10-07 MED ORDER — SODIUM CHLORIDE 0.9% FLUSH: 3.0000 mL | INTRAVENOUS | Status: DC | PRN

## 2016-10-07 MED ORDER — SIMETHICONE 80 MG PO CHEW
80.0000 mg | CHEWABLE_TABLET | ORAL | Status: DC | PRN
  Filled 2016-10-07: qty 1

## 2016-10-07 MED ORDER — KETOROLAC TROMETHAMINE 30 MG/ML IJ SOLN
30.0000 mg | Freq: Four times a day (QID) | INTRAMUSCULAR | Status: DC | PRN
  Administered 2016-10-07: 30 mg via INTRAVENOUS
  Filled 2016-10-07: qty 1

## 2016-10-07 MED ORDER — SODIUM CHLORIDE 0.9 % IV SOLN
INTRAVENOUS | Status: DC | PRN
  Administered 2016-10-07: 30 ug via INTRAVENOUS

## 2016-10-07 MED ORDER — ONDANSETRON HCL 4 MG/2ML IJ SOLN
4.0000 mg | Freq: Once | INTRAMUSCULAR | Status: AC | PRN
  Administered 2016-10-07: 4 mg via INTRAVENOUS
  Filled 2016-10-07: qty 2

## 2016-10-07 MED ORDER — OXYTOCIN 40 UNITS IN LACTATED RINGERS INFUSION - SIMPLE MED
INTRAVENOUS | Status: DC | PRN
  Administered 2016-10-07: 1 mL via INTRAVENOUS

## 2016-10-07 MED ORDER — NALBUPHINE HCL 10 MG/ML IJ SOLN: 5.0000 mg | INTRAMUSCULAR | Status: DC | PRN

## 2016-10-07 MED ORDER — FENTANYL CITRATE (PF) 100 MCG/2ML IJ SOLN: 25.0000 ug | INTRAMUSCULAR | Status: DC | PRN

## 2016-10-07 MED ORDER — CARBOPROST TROMETHAMINE 250 MCG/ML IM SOLN
INTRAMUSCULAR | Status: AC
  Filled 2016-10-07: qty 1

## 2016-10-07 MED ORDER — NALBUPHINE HCL 10 MG/ML IJ SOLN: 5.0000 mg | Freq: Once | INTRAMUSCULAR | Status: DC | PRN

## 2016-10-07 MED ORDER — PRENATAL MULTIVITAMIN CH
1.0000 | ORAL_TABLET | Freq: Every day | ORAL | Status: DC
  Administered 2016-10-08 – 2016-10-12 (×5): 1 via ORAL
  Filled 2016-10-07 (×5): qty 1

## 2016-10-07 MED ORDER — DEXTROSE 5 % IV SOLN
1.0000 ug/kg/h | INTRAVENOUS | Status: DC | PRN
  Filled 2016-10-07: qty 2

## 2016-10-07 MED ORDER — OXYTOCIN 40 UNITS IN LACTATED RINGERS INFUSION - SIMPLE MED
2.5000 [IU]/h | INTRAVENOUS | Status: AC
  Administered 2016-10-07: 2.5 [IU]/h via INTRAVENOUS
  Filled 2016-10-07: qty 1000

## 2016-10-07 MED ORDER — DIPHENHYDRAMINE HCL 25 MG PO CAPS: 25.0000 mg | ORAL_CAPSULE | ORAL | Status: DC | PRN

## 2016-10-07 MED ORDER — MORPHINE SULFATE (PF) 0.5 MG/ML IJ SOLN
INTRAMUSCULAR | Status: AC
  Filled 2016-10-07: qty 10

## 2016-10-07 MED ORDER — SENNOSIDES-DOCUSATE SODIUM 8.6-50 MG PO TABS
2.0000 | ORAL_TABLET | ORAL | Status: DC
  Administered 2016-10-08 – 2016-10-12 (×5): 2 via ORAL
  Filled 2016-10-07 (×5): qty 2

## 2016-10-07 MED ORDER — DIPHENHYDRAMINE HCL 25 MG PO CAPS: 25.0000 mg | ORAL_CAPSULE | Freq: Four times a day (QID) | ORAL | Status: DC | PRN

## 2016-10-07 MED ORDER — BUPIVACAINE IN DEXTROSE 0.75-8.25 % IT SOLN
INTRATHECAL | Status: DC | PRN
  Administered 2016-10-07: 1.7 mL via INTRATHECAL

## 2016-10-07 MED ORDER — MENTHOL 3 MG MT LOZG
1.0000 | LOZENGE | OROMUCOSAL | Status: DC | PRN
Start: 2016-10-07 — End: 2016-10-13
  Filled 2016-10-07: qty 9

## 2016-10-07 MED ORDER — KETOROLAC TROMETHAMINE 30 MG/ML IJ SOLN: 30.0000 mg | Freq: Four times a day (QID) | INTRAMUSCULAR | Status: DC | PRN

## 2016-10-07 MED ORDER — ONDANSETRON HCL 4 MG/2ML IJ SOLN
INTRAMUSCULAR | Status: AC
  Filled 2016-10-07: qty 2

## 2016-10-07 MED ORDER — PHENYLEPHRINE 40 MCG/ML (10ML) SYRINGE FOR IV PUSH (FOR BLOOD PRESSURE SUPPORT)
PREFILLED_SYRINGE | INTRAVENOUS | Status: AC
  Filled 2016-10-07: qty 10

## 2016-10-07 MED ORDER — PHENYLEPHRINE HCL 10 MG/ML IJ SOLN
INTRAMUSCULAR | Status: AC
  Filled 2016-10-07: qty 1

## 2016-10-07 MED ORDER — SOD CITRATE-CITRIC ACID 500-334 MG/5ML PO SOLN: 30.0000 mL | ORAL | Status: DC | PRN

## 2016-10-07 MED ORDER — DIBUCAINE 1 % RE OINT: 1.0000 "application " | TOPICAL_OINTMENT | RECTAL | Status: DC | PRN

## 2016-10-07 MED ORDER — LACTATED RINGERS IV SOLN: INTRAVENOUS | Status: DC

## 2016-10-07 MED ORDER — OXYCODONE HCL 5 MG PO TABS
10.0000 mg | ORAL_TABLET | ORAL | Status: DC | PRN
  Administered 2016-10-08 – 2016-10-11 (×3): 10 mg via ORAL
  Filled 2016-10-07 (×16): qty 2

## 2016-10-07 MED ORDER — OXYTOCIN 40 UNITS IN LACTATED RINGERS INFUSION - SIMPLE MED
2.5000 [IU]/h | INTRAVENOUS | Status: DC
  Filled 2016-10-07: qty 1000

## 2016-10-07 MED ORDER — SIMETHICONE 80 MG PO CHEW
80.0000 mg | CHEWABLE_TABLET | Freq: Three times a day (TID) | ORAL | Status: DC
  Administered 2016-10-07 – 2016-10-12 (×16): 80 mg via ORAL
  Filled 2016-10-07 (×15): qty 1

## 2016-10-07 MED ORDER — OXYCODONE HCL 5 MG PO TABS
10.0000 mg | ORAL_TABLET | ORAL | Status: DC | PRN
  Administered 2016-10-08 – 2016-10-11 (×13): 10 mg via ORAL
  Administered 2016-10-11: 5 mg via ORAL
  Filled 2016-10-07 (×2): qty 2

## 2016-10-07 MED ORDER — IBUPROFEN 600 MG PO TABS
600.0000 mg | ORAL_TABLET | Freq: Four times a day (QID) | ORAL | Status: DC
  Administered 2016-10-07 – 2016-10-10 (×10): 600 mg via ORAL
  Filled 2016-10-07 (×10): qty 1

## 2016-10-07 MED ORDER — PROPOFOL 10 MG/ML IV BOLUS
INTRAVENOUS | Status: AC
  Filled 2016-10-07: qty 40

## 2016-10-07 MED ORDER — ACETAMINOPHEN 325 MG PO TABS: 650.0000 mg | ORAL_TABLET | ORAL | Status: DC | PRN

## 2016-10-07 MED ORDER — LACTATED RINGERS IV SOLN
INTRAVENOUS | Status: DC
  Administered 2016-10-07 – 2016-10-08 (×3): via INTRAVENOUS

## 2016-10-07 MED ORDER — TETANUS-DIPHTH-ACELL PERTUSSIS 5-2.5-18.5 LF-MCG/0.5 IM SUSP: 0.5000 mL | Freq: Once | INTRAMUSCULAR | Status: DC

## 2016-10-07 MED ORDER — SIMETHICONE 80 MG PO CHEW: 80.0000 mg | CHEWABLE_TABLET | ORAL | Status: DC

## 2016-10-07 MED ORDER — CEFAZOLIN SODIUM-DEXTROSE 2-4 GM/100ML-% IV SOLN
2.0000 g | Freq: Once | INTRAVENOUS | Status: AC
  Administered 2016-10-07: 2 g via INTRAVENOUS
  Filled 2016-10-07: qty 100

## 2016-10-07 MED ORDER — LACTATED RINGERS IV SOLN
INTRAVENOUS | Status: DC | PRN
  Administered 2016-10-07: 06:00:00 via INTRAVENOUS

## 2016-10-07 MED ORDER — MORPHINE SULFATE (PF) 0.5 MG/ML IJ SOLN: INTRAMUSCULAR | Status: DC | PRN

## 2016-10-07 MED ORDER — MEPERIDINE HCL 25 MG/ML IJ SOLN: 6.2500 mg | INTRAMUSCULAR | Status: DC | PRN

## 2016-10-07 MED ORDER — LIDOCAINE HCL (PF) 2 % IJ SOLN
INTRAMUSCULAR | Status: AC
  Filled 2016-10-07: qty 2

## 2016-10-07 MED ORDER — LIDOCAINE HCL (PF) 1 % IJ SOLN: 30.0000 mL | INTRAMUSCULAR | Status: DC | PRN

## 2016-10-07 MED ORDER — ACETAMINOPHEN 325 MG PO TABS
650.0000 mg | ORAL_TABLET | ORAL | Status: DC | PRN
  Administered 2016-10-09 – 2016-10-12 (×6): 650 mg via ORAL
  Filled 2016-10-07 (×6): qty 2

## 2016-10-07 MED ORDER — ZOLPIDEM TARTRATE 5 MG PO TABS: 5.0000 mg | ORAL_TABLET | Freq: Every evening | ORAL | Status: DC | PRN

## 2016-10-07 MED ORDER — MORPHINE SULFATE-NACL 0.5-0.9 MG/ML-% IV SOSY
PREFILLED_SYRINGE | INTRAVENOUS | Status: DC | PRN
  Administered 2016-10-07: .2 mg via EPIDURAL

## 2016-10-07 SURGICAL SUPPLY — 24 items
BARRIER ADHS 3X4 INTERCEED (GAUZE/BANDAGES/DRESSINGS) IMPLANT
CANISTER SUCT 3000ML (MISCELLANEOUS) ×3 IMPLANT
CATH KIT ON-Q SILVERSOAK 5IN (CATHETERS) IMPLANT
CHLORAPREP W/TINT 26ML (MISCELLANEOUS) ×6 IMPLANT
DRSG TELFA 3X8 NADH (GAUZE/BANDAGES/DRESSINGS) ×6 IMPLANT
ELECT CAUTERY BLADE 6.4 (BLADE) ×3 IMPLANT
ELECT REM PT RETURN 9FT ADLT (ELECTROSURGICAL) ×3
ELECTRODE REM PT RTRN 9FT ADLT (ELECTROSURGICAL) ×1 IMPLANT
GAUZE SPONGE 4X4 12PLY STRL (GAUZE/BANDAGES/DRESSINGS) ×3 IMPLANT
GLOVE BIO SURGEON STRL SZ8 (GLOVE) ×3 IMPLANT
GOWN STRL REUS W/ TWL LRG LVL3 (GOWN DISPOSABLE) ×2 IMPLANT
GOWN STRL REUS W/ TWL XL LVL3 (GOWN DISPOSABLE) ×1 IMPLANT
GOWN STRL REUS W/TWL LRG LVL3 (GOWN DISPOSABLE) ×4
GOWN STRL REUS W/TWL XL LVL3 (GOWN DISPOSABLE) ×2
NS IRRIG 1000ML POUR BTL (IV SOLUTION) ×3 IMPLANT
PACK C SECTION AR (MISCELLANEOUS) ×3 IMPLANT
PAD OB MATERNITY 4.3X12.25 (PERSONAL CARE ITEMS) ×3 IMPLANT
PAD PREP 24X41 OB/GYN DISP (PERSONAL CARE ITEMS) ×6 IMPLANT
SPONGE LAP 18X18 5 PK (GAUZE/BANDAGES/DRESSINGS) ×6 IMPLANT
STAPLER INSORB 30 2030 C-SECTI (MISCELLANEOUS) ×3 IMPLANT
STRAP SAFETY BODY (MISCELLANEOUS) ×3 IMPLANT
SUT CHROMIC 1 CTX 36 (SUTURE) ×9 IMPLANT
SUT PLAIN GUT 0 (SUTURE) IMPLANT
SUT VIC AB 0 CT1 36 (SUTURE) ×12 IMPLANT

## 2016-10-07 NOTE — Anesthesia Post-op Follow-up Note (Cosign Needed)
Anesthesia QCDR form completed.        

## 2016-10-07 NOTE — Anesthesia Procedure Notes (Signed)
Spinal  Patient location during procedure: OR Staffing Anesthesiologist: Berdine AddisonHOMAS, Chasidy Janak Performed: anesthesiologist  Preanesthetic Checklist Completed: patient identified, site marked, surgical consent, pre-op evaluation, timeout performed, IV checked and risks and benefits discussed Spinal Block Patient position: sitting Prep: Betadine Patient monitoring: heart rate, cardiac monitor, continuous pulse ox and blood pressure Approach: midline Location: L3-4 Injection technique: single-shot Needle Needle type: Pencil-Tip  Needle gauge: 25 G Needle length: 9 cm Assessment Sensory level: T10 Additional Notes Marcaine 1.567ml and astro 0.2mg .

## 2016-10-07 NOTE — Discharge Summary (Addendum)
Obstetric Discharge Summary Reason for Admission: 36+3 , ctx , fetal bradycardia Prenatal Procedures: none Intrapartum Procedures: cesarean: low cervical, transverse, posterior uterine wall incision  Postpartum Procedures: CT, EEG, MRI, depo provera  Complications-Operative and Postpartum:  - postpartum severe anemia to Hgb of 6.7, given 2 u pRBC on POD#2. C/o dizziness, lightheadedness and fatigue. - unwitnessed fall with syncope on POD#3: Neurology consult with likely seizures, present for years with multiple episodes of syncope with dizziness. Received CT scan, MRI and EEG. Topamax started with plans for neurology followup as outpatient in 2 weeks. We gave her numbers for [facility] outpatient clinic neurology x2. She can also call us to see if she can be seen at [facility]. - poor material resources. Pack n play, co sleeper, formula and car seat given to patient prior to discharge. She is safe at home. She missed her [facility] appointment while she was inpatient.  Hemoglobin  Date Value Ref Range Status  10/11/2016 10.1 (L) 12.0 - 16.0 g/dL Final   HGB  Date Value Ref Range Status  05/14/2014 11.2 (L) 12.0 - 16.0 g/dL Final   HCT  Date Value Ref Range Status  10/11/2016 30.2 (L) 35.0 - 47.0 % Final  05/14/2014 36.0 35.0 - 47.0 % Final    Physical Exam:  General: alert and cooperative Lochia: appropriate Uterine Fundus: firm Incision: healing well DVT Evaluation: No evidence of DVT seen on physical exam.  Discharge Diagnoses: PTD for non reassuring fetal monitoring , uterine autorotation 180 degrees  Discharge Information: Date: 10/12/2016 Activity: pelvic rest Diet: routine Medications: Ibuprofen, Colace and Percocet, iron and vitamin C, topamax Condition: stable Instructions: refer to practice specific booklet Discharge to: home Follow-up Information    [facility] Follow up in 2 week(s).   Specialty:  Neurology Contact information: [address] [place] [address] [phone]         Depo shot given in hospital  Newborn Data: Live born female "Zaniya" Birth Weight: 5 lb 5.7 oz (2430 g) APGAR: 8, 9  Home with mother. Formula feeding    Allergies as of 10/12/2016   No Known Allergies     Medication List    TAKE these medications   ascorbic acid 250 MG tablet Commonly known as:  VITAMIN C Take 1 tablet (250 mg total) by mouth 2 (two) times daily with a meal. Take with iron for anemia   cyclobenzaprine 5 MG tablet Commonly known as:  FLEXERIL Take 1 tablet (5 mg total) by mouth 2 (two) times daily as needed for muscle spasms.   docusate sodium 100 MG capsule Commonly known as:  COLACE Take 1 capsule (100 mg total) by mouth daily as needed for mild constipation.   ferrous sulfate 325 (65 FE) MG tablet Take 1 tablet (325 mg total) by mouth 2 (two) times daily with a meal. For anemia, take with Vitamin C   ibuprofen 600 MG tablet Commonly known as:  ADVIL,MOTRIN Take 1 tablet (600 mg total) by mouth every 6 (six) hours. Start taking on:  10/13/2016   medroxyPROGESTERone 150 MG/ML injection Commonly known as:  DEPO-PROVERA Inject 1 mL (150 mg total) into the muscle every 3 (three) months. Next due 01/10/17 Start taking on:  01/08/2017   multivitamin-prenatal 27-0.8 MG Tabs tablet Take 1 tablet by mouth daily at 12 noon.   nitrofurantoin (macrocrystal-monohydrate) 100 MG capsule Commonly known as:  MACROBID Take 100 mg by mouth 3 (three) times daily.   ondansetron 4 MG  disintegrating tablet Commonly known as:  ZOFRAN-ODT Take 1 tablet (4 mg total) by mouth every 8 (eight) hours as needed for nausea or vomiting.   oxyCODONE 5 MG immediate release tablet Commonly known as:  Oxy IR/ROXICODONE Take 1 tablet (5 mg total) by mouth every 4 (four) hours as needed for moderate pain (pain scale 4-7).   topiramate 50 MG tablet Commonly known as:  TOPAMAX Take 1 tablet (50 mg total) by mouth 2  (two) times daily. Take 1/2 tab (25mg ) 2x daily for 2 weeks, then increase to 1 tab (2830m) BID      Adelheid Hoggard 10/12/2016, 6:47 PM

## 2016-10-07 NOTE — Anesthesia Preprocedure Evaluation (Signed)
Anesthesia Evaluation  Patient identified by MRN, date of birth, ID band Patient awake    Reviewed: Allergy & Precautions, NPO status , Patient's Chart, lab work & pertinent test results, reviewed documented beta blocker date and time   Airway Mallampati: II  TM Distance: >3 FB     Dental  (+) Chipped   Pulmonary former smoker,           Cardiovascular      Neuro/Psych  Headaches,    GI/Hepatic   Endo/Other    Renal/GU Renal InsufficiencyRenal disease     Musculoskeletal   Abdominal   Peds  Hematology  (+) anemia ,   Anesthesia Other Findings   Reproductive/Obstetrics                             Anesthesia Physical Anesthesia Plan  ASA: II  Anesthesia Plan: General and Spinal   Post-op Pain Management:    Induction: Intravenous  Airway Management Planned: Oral ETT  Additional Equipment:   Intra-op Plan:   Post-operative Plan:   Informed Consent: I have reviewed the patients History and Physical, chart, labs and discussed the procedure including the risks, benefits and alternatives for the proposed anesthesia with the patient or authorized representative who has indicated his/her understanding and acceptance.     Plan Discussed with: CRNA  Anesthesia Plan Comments:         Anesthesia Quick Evaluation

## 2016-10-07 NOTE — H&P (Signed)
Jill [name] is a 31 y.o. female presenting for ctx . [facility]  36+3 weeks . Zyan.GrimeG6P4 with 4 prior c/s . EMS delivery . Prolong fetal decel noted and return to 150 with little variability . Declines BTL Limited records .  h/o PP hemorrhage  OB History    Gravida Para Term Preterm AB Living   6 3 3   1 4    SAB TAB Ectopic Multiple Live Births           4     Past Medical History:  Diagnosis Date  . Anemia   . Chronic kidney disease   . Headache    Past Surgical History:  Procedure Laterality Date  . CESAREAN SECTION     x4   Family History: family history is not on file. Social History:  reports that she has quit smoking. She has never used smokeless tobacco. She reports that she does not drink alcohol or use drugs.     lNo labs  ROS History Dilation: 1 Effacement (%): 60 Station: -3 Exam by:: Hurshel PartyE. Rivera, RN  Last menstrual period 01/26/2016. Exam Physical Exam  NST currently 150  Prenatal labs: ABO, Rh: --/--/O POS (09/05 0931) Antibody:  no labs Rubella:  no labs RPR:   no labs HBsAg:   no labs HIV:   no labs  GBS:   no labs   Assessment/Plan: nonreassuring fetal monitoring  Repeat LTCS    Jill [name] 10/07/2016, 5:51 AM

## 2016-10-07 NOTE — Transfer of Care (Signed)
Immediate Anesthesia Transfer of Care Note  Patient: Jill [name]  Procedure(s) Performed: Procedure(s): CESAREAN SECTION (N/A)  Patient Location: PACU  Anesthesia Type:Spinal  Level of Consciousness: awake, alert  and oriented  Airway & Oxygen Therapy: Patient Spontanous Breathing  Post-op Assessment: Post -op Vital signs reviewed and stable  Post vital signs: stable  Last Vitals:  Vitals:   10/07/16 0534 10/07/16 0724  BP: 110/79 96/62  Pulse: (!) 109 92  Resp: 18 18  Temp: 36.6 C     Last Pain:  Vitals:   10/07/16 0534  TempSrc: Oral         Complications: No apparent anesthesia complications

## 2016-10-07 NOTE — Progress Notes (Signed)
Pt. Alert and oriented x4, continues to be nauseated, last dose of Zofran given at 0738, 4mg . Pericare provided, clean pad in place.  Ready for transfer to PP unit, bed 350.  Infant transferred via basinet by FOB, nursery nurse in another delivery (C-section).

## 2016-10-07 NOTE — Progress Notes (Signed)
While in another patient's room, [facility] (Telemetry monitor not registering respiration rate). RN spoke to patient, pt. Did not answer, sternal rub applied - no response (BP 99/63, pulse 95 bpm, o2 95-100% on RA. Once I walked into the room the nurse at the bedside  informed me the patient was unresponsive for about one minute.  I touched the patient and called her name, she opened her eyes and was able to speak coherently. Anesthesia notified, Dr. Karlton LemonKarenz, J. Bachich, CRNA to the bedside to evaluate the patient.  After evaluating  patient, RN called surgeon who performed C/S (Dr. Feliberto GottronSchermerhorn to update him concerning pt's status, I was directed to call the doctor on call. Dr. Elesa MassedWard notified.  [facility]. [facility]Ward [facility] the [facility].

## 2016-10-07 NOTE — Progress Notes (Signed)
0845 assessment WNL,  Bair Hugger in place, RN unable to get an oral temp earlier, after applying double warm blankets on patient and warm blanket around head, axillary temp registering 92.0, pt felt warmer.  After 30 minutes on Bair Hugger, oral temp 97.3 F. Pt. States is getting warm, Bair Hugger remains in place, RN explained to patient, "I would like for you to get a little warmer." Pt. In agreement.

## 2016-10-07 NOTE — Brief Op Note (Signed)
10/07/2016  7:24 AM  PATIENT:  Jill [name]  31 y.o. female  PRE-OPERATIVE DIAGNOSIS:  fetal intolerance; repeat csection  POST-OPERATIVE DIAGNOSIS:  fetal intolerance; repeat csection Autorotation of uterus 180 degrees   PROCEDURE:  Procedure(s): CESAREAN SECTION (N/A)  LTCS ( posterior incision)  SURGEON:  Surgeon(s) and Role:    Suzy Bouchard* Jadie Comas J Jamaris Theard, MD - Primary  PHYSICIAN ASSISTANT: scrub Street  ASSISTANTS: none   ANESTHESIA:   spinal  EBL:  Total I/O In: -  Out: 750 [Urine:150; Blood:600]  IOF 1000 cc  BLOOD ADMINISTERED:none  DRAINS: foley LOCAL MEDICATIONS USED:  NONE  SPECIMEN:  No Specimen  DISPOSITION OF SPECIMEN:  N/A  COUNTS:  YES  TOURNIQUET:  * No tourniquets in log *  DICTATION: .Other Dictation: Dictation Number verbal  PLAN OF CARE: Admit to inpatient   PATIENT DISPOSITION:  PACU - hemodynamically stable.   Delay start of Pharmacological VTE agent (>24hrs) due to surgical blood loss or risk of bleeding: n/a

## 2016-10-08 LAB — CBC
HCT: 20.8 % — ABNORMAL LOW (ref 35.0–47.0)
Hemoglobin: 6.7 g/dL — ABNORMAL LOW (ref 12.0–16.0)
MCH: 23.9 pg — ABNORMAL LOW (ref 26.0–34.0)
MCHC: 32.4 g/dL (ref 32.0–36.0)
MCV: 73.9 fL — ABNORMAL LOW (ref 80.0–100.0)
Platelets: 229 10*3/uL (ref 150–440)
RBC: 2.82 MIL/uL — ABNORMAL LOW (ref 3.80–5.20)
RDW: 15.8 % — ABNORMAL HIGH (ref 11.5–14.5)
WBC: 13.1 10*3/uL — ABNORMAL HIGH (ref 3.6–11.0)

## 2016-10-08 MED ORDER — FERROUS SULFATE 325 (65 FE) MG PO TABS
325.0000 mg | ORAL_TABLET | Freq: Two times a day (BID) | ORAL | Status: DC
  Administered 2016-10-08 – 2016-10-12 (×9): 325 mg via ORAL
  Filled 2016-10-08 (×9): qty 1

## 2016-10-08 MED ORDER — VITAMIN C 500 MG PO TABS
250.0000 mg | ORAL_TABLET | Freq: Two times a day (BID) | ORAL | Status: DC
  Administered 2016-10-09 (×2): 250 mg via ORAL
  Administered 2016-10-10: 500 mg via ORAL
  Administered 2016-10-10 – 2016-10-12 (×4): 250 mg via ORAL
  Filled 2016-10-08 (×11): qty 0.5

## 2016-10-08 NOTE — Progress Notes (Signed)
Patient ambulated 1 lap around nurses station pushing infant in bassinet with RN. Patient tolerated fair. Patient still declines blood transfusion at this time.

## 2016-10-08 NOTE — Progress Notes (Signed)
Subjective: Postpartum Day 1: low posterior transverse Cesarean Delivery Patient reports no nausea, +dizziness on ambulation and fatigue  Objective: Vital signs in last 24 hours: Temp:  [97.6 F (36.4 C)-98.8 F (37.1 C)] 98.5 F (36.9 C) (01/26 0836) Pulse Rate:  [98-116] 98 (01/26 0836) Resp:  [18-20] 18 (01/26 0505) BP: (92-111)/(51-76) 111/51 (01/26 0836) SpO2:  [97 %-100 %] 100 % (01/26 0836)  Physical Exam:  General: alert, cooperative, appears stated age and fatigued Lochia: appropriate Uterine Fundus: firm Incision: no significant drainage, no significant erythema, dressing still in place DVT Evaluation: No evidence of DVT seen on physical exam. Negative Homan's sign.   Recent Labs  10/07/16 0547 10/08/16 0537  HGB 9.4* 6.7*  HCT 28.7* 20.8*    Assessment/Plan: Status post Cesarean section. Postoperative course complicated by acute on chronic blood loss anemia  - I have offered pRBC empirically, and discussed profound anemia specifically - Orthostatic vitals. Vitals are stable with hypotension as on admission - po iron and vitamin C  Continue current care. Breastfeeding  Jill [name] 10/08/2016, 9:51 AM

## 2016-10-08 NOTE — Op Note (Signed)
NAMAsa Saunas:  Jill [name], Ilayda            ACCOUNT NO.:  [id number]  MEDICAL RECORD NO.:  [id number]  LOCATION:                                 FACILITY:  PHYSICIAN:  Jennell Cornerhomas Staley Budzinski, MDDATE OF BIRTH:  02-08-1986  DATE OF PROCEDURE: DATE OF DISCHARGE:                              OPERATIVE REPORT   PREOPERATIVE DIAGNOSIS:  Non-reassuring fetal monitoring, 36+ 3 weeks estimated gestational age.  POSTOPERATIVE DIAGNOSIS: 1. Non-reassuring fetal monitoring. 2. Uterine autorotation.  PROCEDURE PERFORMED:  Urgent low transverse uterine incision with posterior uterine incision.  SURGEON:  Jennell Cornerhomas Lashena Signer, MD  ANESTHESIA:  Spinal.  FIRST ASSISTANT:  Dr. Casper HarrisonStreet  INDICATION:  A 31 year old, gravida 6, para 4, presented via EMS [facility].  Initial evaluation of fetus demonstrated prolonged fetal bradycardic episode with ultimate return to fetal heart rate of 150 with minimal variability.  The patient is status post 4 prior cesarean sections.  Given the non-reassuring fetal monitoring, it was decided that the patient would be taken for repeat cesarean section.  FINDINGS:  Autorotation of the uterus 180 degrees with the posterior uterus presenting to the surgeon's eye, with presumed adhesions of the right adnexa draped across the lower uterine segment to the left sidewall.  DESCRIPTION OF PROCEDURE:  After adequate spinal anesthesia, the patient was placed in dorsal supine position.  A Foley catheter was placed.  The patient did receive 2 g IV Ancef prior to commencement of the case.  The patient was prepped and draped in normal sterile fashion.  A Pfannenstiel incision was made 2 fingerbreadths above the symphysis pubis.  Sharp dissection was used to identify the fascia.  Fascia was opened in the midline and opened in a transverse fashion.  The superior aspect of the fascia was grasped with Kocher clamps and the recti muscles dissected free.  Inferior  aspect of the fascia was grasped with Kocher clamps and the pyramidalis muscle was dissected free.  Entry into the peritoneal cavity was accomplished sharply.  Initial evaluation of the uterus showed what appeared to be the right adnexa with fallopian tube and blood vessels draped across the inferior lower uterine segment, presumably adhesed to from the right side to the patient's left side.  A clear area cephalad to this was identified and incised in a transverse fashion.  Upon entry into the endometrial cavity, clear fluid resulted. Fetal head was brought to the incision and the vigorous female was delivered without difficulty.  Oral nasal pharynx were suctioned on the patient's abdomen and she was dried, waiting for a full 60 seconds before cord clamping and passing the infant to awaiting nursery staff. Apgars were assigned of 8 and 9.  The placenta was delivered manually and the uterus was exteriorized.  Endometrial cavity was wiped clean with laparotomy tape and the cervix was opened with a ring forceps and this was passed off the operative field.  The incision was closed with 1 chromic suture in a running locking fashion.  Several additional figure- of-eight sutures were required for good hemostasis.  At this point, surgeon directed attention to the presumed scarred fallopian tube and adnexa.  Upon further evaluation, it was deemed that the uterus had autorotated 180  degrees.  Therefore, rotating the uterus in a counter- clockwise position placed the uterus in normal anatomic position with the previously made incision in the posterior wall of the lower uterine segment.  The anterior wall appeared normal.  Adnexa now appeared normal.  The posterior cul-de-sac was then irrigated and suctioned, and the paracolic gutters were wiped clean with laparotomy tape.  Good hemostasis was noted.  The fascia was then closed with 0 Vicryl suture in a running nonlocking fashion.  Subcutaneous  tissues were irrigated and the skin was reapproximated with Insorb absorbable staples.  Good cosmetic effect resulted.  Of note, the patient was asked prior to the procedure, if she elected for permanent sterilization, she declined.  I again explained this clinical scenario, which was rare, but could happen in the future if she liked to be pregnant again.  Presumably, the rotation of the uterus may have somehow altered blood flow and caused the fetal bradycardic episodes that we evaluated prior to surgery.  ESTIMATED BLOOD LOSS:  600 mL.  INTRAOPERATIVE FLUIDS:  1000 mL.  The patient was taken to recovery room in good condition.    ______________________________ Jennell Corner, MD   ______________________________ Jennell Corner, MD    TS/MEDQ  D:  10/07/2016  T:  10/08/2016  Job:  [id number]

## 2016-10-08 NOTE — Progress Notes (Signed)
MD offered patient a blood transfusion due to anemia. RN into patient room to get blood consent signed. Patient declines blood transfusion at this time stating that she wants to see how she feels today after getting up and moving around. Denies dizziness, light headedness, and fatigue. Patient educated on signs and symptoms of  anemia and made aware that we have orders available  to administer the blood if she changes her mind and/or starts to feel symptomatic.

## 2016-10-08 NOTE — Anesthesia Post-op Follow-up Note (Signed)
  Anesthesia Pain Follow-up Note  Patient: Jill [name]  Day #: 1  Date of Follow-up: 10/08/2016 Time: 7:28 AM  Last Vitals:  Vitals:   10/07/16 2341 10/08/16 0505  BP: (!) 100/55 (!) 93/54  Pulse: (!) 113 (!) 104  Resp: 18 18  Temp: 37.1 C 36.8 C    Level of Consciousness: alert  Pain: none   Side Effects:None  Catheter Site Exam:clean, dry, no drainage     Plan: Continue current therapy of postop epidural at surgeon's request  [name],  Sheran FavaMark R

## 2016-10-08 NOTE — Clinical Social Work Maternal (Signed)
  CLINICAL SOCIAL WORK MATERNAL/CHILD NOTE  Patient Details  Name: Jill [name] MRN: [id number] Date of Birth: November 13, 1985  Date:  10/08/2016  Clinical Social Worker Initiating Note:  Shela Leff MSW,LCSW  Date/ Time Initiated:  10/08/16/      Child's Name:      Legal Guardian:  Mother   Need for Interpreter:  None   Date of Referral:        Reason for Referral:   (lack of necessities for infant)   Referral Source:  RN   Address:     Phone number:      Household Members:  Self, Minor Children   Natural Supports (not living in the home):  Friends, Other (Comment) (father of one of her minor children)   Professional Supports: None   Employment:     Type of Work:     Education:      Pensions consultant:  Multimedia programmer   Other Resources:  [employer]   Cultural/Religious Considerations Which May Impact Care:  none  Strengths:  Compliance with medical plan    Risk Factors/Current Problems:  Basic Needs    Cognitive State:  Alert , Able to Concentrate    Mood/Affect:  Calm    CSW Assessment: CSW consulted due to patient stating she does not have a car seat. CSW met with patient this afternoon and she stated that she lives with her and her minor children and that the father of one of her children helps out. Patient stated that she has on car seat and no place for her newborn to sleep. When CSW inquired as to why she was not prepared for her newborn she did not have an answer. Patient did have a car seat she was given by a friend but it was out of date and can not be used. CSW has informed patient it is her responsibility to contact friends/family to see if she can obtain a car seat and a safe place for patient to sleep. Patient verbalized understanding. CSW informed patient that the weekend CSW will follow up with her to get an update on her progress.  CSW Plan/Description:  Patient/Family Education     [place], [place] 10/08/2016, 4:28 PM

## 2016-10-08 NOTE — Anesthesia Postprocedure Evaluation (Signed)
Anesthesia Post Note  Patient: Jill [name]  Procedure(s) Performed: Procedure(s) (LRB): CESAREAN SECTION (N/A)  Patient location during evaluation: Mother Baby Anesthesia Type: Spinal Level of consciousness: awake, awake and alert and oriented Pain management: pain level controlled Vital Signs Assessment: post-procedure vital signs reviewed and stable Respiratory status: spontaneous breathing, nonlabored ventilation and respiratory function stable Cardiovascular status: stable Postop Assessment: no headache and no backache Anesthetic complications: no     Last Vitals:  Vitals:   10/07/16 2341 10/08/16 0505  BP: (!) 100/55 (!) 93/54  Pulse: (!) 113 (!) 104  Resp: 18 18  Temp: 37.1 C 36.8 C    Last Pain:  Vitals:   10/08/16 0600  TempSrc:   PainSc: 6                  Czarina Gingras,  Karron Alvizo R

## 2016-10-09 LAB — CBC
HCT: 20.5 % — ABNORMAL LOW (ref 35.0–47.0)
Hemoglobin: 6.8 g/dL — ABNORMAL LOW (ref 12.0–16.0)
MCH: 24.4 pg — ABNORMAL LOW (ref 26.0–34.0)
MCHC: 32.9 g/dL (ref 32.0–36.0)
MCV: 74.1 fL — ABNORMAL LOW (ref 80.0–100.0)
Platelets: 222 10*3/uL (ref 150–440)
RBC: 2.77 MIL/uL — ABNORMAL LOW (ref 3.80–5.20)
RDW: 15.9 % — ABNORMAL HIGH (ref 11.5–14.5)
WBC: 11.3 10*3/uL — ABNORMAL HIGH (ref 3.6–11.0)

## 2016-10-09 LAB — PREPARE RBC (CROSSMATCH)

## 2016-10-09 MED ORDER — SODIUM CHLORIDE 0.9 % IV SOLN
Freq: Once | INTRAVENOUS | Status: AC
  Administered 2016-10-09: 14:00:00 via INTRAVENOUS

## 2016-10-09 MED ORDER — ACETAMINOPHEN 325 MG PO TABS
650.0000 mg | ORAL_TABLET | Freq: Once | ORAL | Status: AC
  Administered 2016-10-09: 650 mg via ORAL
  Filled 2016-10-09: qty 2

## 2016-10-09 MED ORDER — ONDANSETRON 4 MG PO TBDP
4.0000 mg | ORAL_TABLET | Freq: Three times a day (TID) | ORAL | Status: DC | PRN
  Administered 2016-10-09: 4 mg via ORAL
  Filled 2016-10-09: qty 1

## 2016-10-09 MED ORDER — FUROSEMIDE 10 MG/ML IJ SOLN
20.0000 mg | Freq: Once | INTRAMUSCULAR | Status: AC
  Administered 2016-10-09: 20 mg via INTRAVENOUS
  Filled 2016-10-09: qty 2

## 2016-10-09 MED ORDER — DIPHENHYDRAMINE HCL 25 MG PO CAPS
25.0000 mg | ORAL_CAPSULE | Freq: Once | ORAL | Status: AC
  Administered 2016-10-09: 25 mg via ORAL
  Filled 2016-10-09: qty 1

## 2016-10-09 NOTE — Progress Notes (Signed)
Subjective: Postpartum Day 2: Cesarean Delivery Patient reports weakness, fatigue, dizziness on ambulation, feeling "wiped out" No flatus.  Baby girl   Objective: Vital signs in last 24 hours: Temp:  [98 F (36.7 C)-98.4 F (36.9 C)] 98 F (36.7 C) (01/27 0736) Pulse Rate:  [101-116] 115 (01/27 0736) Resp:  [16-18] 16 (01/27 0736) BP: (99-117)/(57-67) 99/65 (01/27 0736) SpO2:  [99 %-100 %] 100 % (01/27 0736)  Physical Exam:  General: alert, cooperative, appears stated age and fatigued Lochia: appropriate Uterine Fundus: firm and tender Abd: Good bowel sounds, no high pitched, not decreased Incision: no significant drainage, no dehiscence, no significant erythema DVT Evaluation: No evidence of DVT seen on physical exam. Negative Homan's sign. No cords or calf tenderness. No significant calf/ankle edema.   Recent Labs  10/07/16 0547 10/08/16 0537  HGB 9.4* 6.7*  HCT 28.7* 20.8*    Assessment/Plan: Status post Cesarean section. Postoperative course complicated by acute blood loss anemia  - I am concerned that her h/h is so low. She is tachycardic and hypotensive. Repeat CBC today. Orthostatic vitals yesterday were positive. She attends to conversation without AMS. Yesterday she declined pRBCs. We spoke again today that I strongly recommend a blood transfusion and that I don't think her body is safe for going home if she is dizzy because of the risk of syncope and resulting physical trauma. She said she will think about it. We discussed the risk of HIV transmission of 1:2,000,000 and unusual viral illness like CMV of 1:30,000. I would give her tylenol and benadryl to decrease side effects. I don't think she is safe to carry the baby based on her vitals, labs and how she feels.  However, as always, she has control over her body and decisions. I will talk with her about it again tomorrow.  - follow repeat CBC - regular diet - encourage ambulation  Continue current  care.  Jill DouglasBEASLEY, Jill Jill [name] 10/09/2016, 11:41 AM

## 2016-10-09 NOTE — Clinical Social Work Note (Signed)
CSW visited patient to check on progress gaining appropriate tools for infant care (car seat and safe sleeping place). The patient indicated at first that the baby would sleep with her. CSW educated the patient on the dangers of co-sleeping with an infant. The patient reported that her case worker is trying to get her a pack and play. The patient has made no progress on finding a car seat. The CSW contacted the family member who also confirmed that a car seat other than the one that is inappropriate is not available. CSW highly suggested to the [name] to continue attempting. CSW informed the RN that if the patient dc's tomorrow, CSW will furnish the car seat for liability purposes. CSW will con't to follow.  Jill PonderKaren Martha Mande [name], MSW, Theresia MajorsLCSWA [phone]

## 2016-10-10 LAB — CBC
HCT: 29.6 % — ABNORMAL LOW (ref 35.0–47.0)
Hemoglobin: 10 g/dL — ABNORMAL LOW (ref 12.0–16.0)
MCH: 26.2 pg (ref 26.0–34.0)
MCHC: 33.9 g/dL (ref 32.0–36.0)
MCV: 77.3 fL — ABNORMAL LOW (ref 80.0–100.0)
Platelets: 251 10*3/uL (ref 150–440)
RBC: 3.84 MIL/uL (ref 3.80–5.20)
RDW: 18.4 % — ABNORMAL HIGH (ref 11.5–14.5)
WBC: 14.4 10*3/uL — ABNORMAL HIGH (ref 3.6–11.0)

## 2016-10-10 LAB — TYPE AND SCREEN
Blood Product Expiration Date: 201802032359
Blood Product Expiration Date: 201802222359
ISSUE DATE / TIME: 201801271420
ISSUE DATE / TIME: 201801271757
Unit Type and Rh: 5100
Unit Type and Rh: 9500

## 2016-10-10 MED ORDER — IBUPROFEN 600 MG PO TABS
600.0000 mg | ORAL_TABLET | Freq: Four times a day (QID) | ORAL | Status: DC
  Administered 2016-10-10 – 2016-10-11 (×3): 600 mg via ORAL
  Filled 2016-10-10 (×3): qty 1

## 2016-10-10 MED ORDER — MEDROXYPROGESTERONE ACETATE 150 MG/ML IM SUSP
150.0000 mg | INTRAMUSCULAR | Status: DC
  Administered 2016-10-12: 150 mg via INTRAMUSCULAR
  Filled 2016-10-10: qty 1

## 2016-10-10 NOTE — Progress Notes (Signed)
Subjective: Postpartum Day 3: Cesarean Delivery Acute blood loss anemia, now s/p 2 u pRBC yesterday with normalization of heartrate and blood pressure. However she is still experiencing dizziness, nausea and fatigue, though minimally improved. Each morning is worse.  Objective: Vital signs in last 24 hours: Temp:  [97.4 F (36.3 C)-98.8 F (37.1 C)] 98.3 F (36.8 C) (01/28 1123) Pulse Rate:  [86-105] 90 (01/28 1123) Resp:  [12-20] 18 (01/28 1123) BP: (96-133)/(59-75) 107/65 (01/28 1123) SpO2:  [99 %-100 %] 100 % (01/28 0727)  Physical Exam:  General: alert, cooperative, appears stated age and fatigued Lochia: appropriate Uterine Fundus: firm Incision: no significant erythema DVT Evaluation: No evidence of DVT seen on physical exam. Negative Homan's sign.   Recent Labs  10/09/16 1146 10/10/16 0625  HGB 6.8* 10.0*  HCT 20.5* 29.6*    Assessment/Plan: Status post Cesarean section. Postoperative course complicated by ABLA, now s/p 2u pRBCs. She is still lhaving difficulty with dizziness with ambulation. Encourage po hydration, sleep and regular diet, and reassess in am. Pt aware she can call our office for concerns after discharge as well.  Depo shot Continue current care.  [name], Keynan Heffern 10/10/2016, 12:11 PM

## 2016-10-11 ENCOUNTER — Inpatient Hospital Stay: Payer: Medicaid Other

## 2016-10-11 DIAGNOSIS — R55 Syncope and collapse: Secondary | ICD-10-CM

## 2016-10-11 LAB — BASIC METABOLIC PANEL
Anion gap: 5 (ref 5–15)
BUN: 11 mg/dL (ref 6–20)
CO2: 25 mmol/L (ref 22–32)
Calcium: 8.4 mg/dL — ABNORMAL LOW (ref 8.9–10.3)
Chloride: 107 mmol/L (ref 101–111)
Creatinine, Ser: 0.88 mg/dL (ref 0.44–1.00)
GFR calc Af Amer: >60 mL/min (ref >60)
GFR calc non Af Amer: >60 mL/min (ref >60)
Glucose, Bld: 140 mg/dL — ABNORMAL HIGH (ref 65–99)
Potassium: 3.8 mmol/L (ref 3.5–5.1)
Sodium: 137 mmol/L (ref 135–145)

## 2016-10-11 LAB — CBC
HCT: 30.2 % — ABNORMAL LOW (ref 35.0–47.0)
Hemoglobin: 10.1 g/dL — ABNORMAL LOW (ref 12.0–16.0)
MCH: 26.4 pg (ref 26.0–34.0)
MCHC: 33.5 g/dL (ref 32.0–36.0)
MCV: 78.9 fL — ABNORMAL LOW (ref 80.0–100.0)
Platelets: 266 10*3/uL (ref 150–440)
RBC: 3.83 MIL/uL (ref 3.80–5.20)
RDW: 18.8 % — ABNORMAL HIGH (ref 11.5–14.5)
WBC: 8.7 10*3/uL (ref 3.6–11.0)

## 2016-10-11 MED ORDER — OXYCODONE HCL 5 MG PO TABS
5.0000 mg | ORAL_TABLET | ORAL | Status: DC | PRN
  Administered 2016-10-11 (×2): 5 mg via ORAL
  Filled 2016-10-11 (×2): qty 1

## 2016-10-11 MED ORDER — IBUPROFEN 600 MG PO TABS
600.0000 mg | ORAL_TABLET | Freq: Four times a day (QID) | ORAL | Status: DC
  Administered 2016-10-11 – 2016-10-12 (×6): 600 mg via ORAL
  Filled 2016-10-11 (×7): qty 1

## 2016-10-11 NOTE — Progress Notes (Signed)
Pt to CT scan from rm 350.

## 2016-10-11 NOTE — Progress Notes (Signed)
Patient ID: Jill [name], female   DOB: 1986-06-11, 31 y.o.   MRN: [id number] Appreciate neuro consult and input . Unequal pupils with nl CT scan today  Anesthesiology did not think her h/a was spinal related . CBC hct stable 30.2 , bmp nl  Cont care anticipate d/c in am

## 2016-10-11 NOTE — Progress Notes (Signed)
I entered patients room about 0030 to give her her Motrin and take her vital signs. While taking her vital signs, patient says "I hit my head, I didn't want to tell you." I asked patient when and where this occurred. Patient stated "I was on my way to the bathroom and I got dizzy and I passed out." "I hit my head on the floor." Patient stated that she did not feel like she was "out for long" and that she "came to right away". Per patient, she woke up laying on the bathroom floor on her right side, but hit the left side of her head on the floor. The left side of her head above the brow and near the temple was tender to touch. On assessment, no swelling or bruising present. Verified by Romeo Appleoris Barton, RN. Vital signs stable q 10 minutes x3 after incident. Neuro assessment within normal limits. PERRLA. Patient complained of a headache 6/10 on left side. Post fall assessment WDL. Head to toe WDL and same as shift assessment. Post fall protocol initiated. Dr. Dalbert GarnetBeasley called and notified of situation at [id number]. Dr. Dalbert GarnetBeasley ordered a Routine Neurology consult for continued dizziness despite WDL blood pressures and normal H&H. Dr. Dalbert GarnetBeasley was okay with doing just the post fall protocol without adding any other orders for now. Instructed to call her back with any change in level of consciousness or vital signs. Due to fall on this admission, fall risk updated with high fall risk score, and high fall risk protocol initiated. Patient educated on post-fall protocol, high fall protocol, need to call out for assistance out of bed every time, and plan of care for the remainder of this shift and what to expect next shift (Neurology consult). Patient verbalized understanding.   Imagene ShellerMegan Nansi Birmingham, RN

## 2016-10-11 NOTE — Progress Notes (Signed)
Spoke with AD of [facility] about pt in need of carseat and bassinet due to [employer] not supplying and patients inability to get them from friends and family.  AD supplied the carseat and a pack in play to the patient.

## 2016-10-11 NOTE — Progress Notes (Signed)
Patient ID: Jill HamburgShaquelah N [name], female   DOB: Jan 23, 1986, 31 y.o.   MRN: [id number]  Called by nursing staff. Patient reports unwitnessed fall preceded by dizziness and syncope, with impact on right side around 12:25 in the bathroom. No change in behavior or mental status, except teary and flat affect, which is not a change from earlier in the day. Pt reports a headache with left-sided tenderness above eye. Vital signs q1510min x3 were normal, without hypotension or tachycardia - first set of vitals BP 130/76 with HR of 96. - Pt reported dizziness and fatigue with room spinning earlier in the day, though states that it improved with ambulation.  Vitals:   10/11/16 0042 10/11/16 0050  BP: 118/72 128/74  Pulse: 89 87  Resp:    Temp:    Temp 98.5 deg   Neuro assessment:  - pupils: PERRLA - Bilateral strength intact and symmetrical grip - clear vision in both eyes in all four quadrants - dorsiflexion and extension LE symmetrical  Surgical incision: C/D/I, no evidence of infection or bleeding Vaginal bleeding: Minimal  A/P: Unwitnessed fall with dizziness and lightheadedness with unexplained etiology. No evidence on exam of infection, internal bleeding. No altered mental status or change in behavior. - Previously ABLA to Hgb of 6.7, now s/p 2u pRBCs with appropriate rise in hgb and normalization of vital signs - Will continue fall protocol, including monitoring change in behavior, headache and vomiting, vital signs q30 min for now and q2hrs for 24 hrs.  - Neurology consult routine, with elevation to stat if any change. - Ibuprofen and tylenol for headache now

## 2016-10-11 NOTE — Consult Note (Signed)
Reason for Consult:Unwitnessed fall Referring Physician: Schermerhorn  CC: Unwitnessed fall.    HPI: Jill [name] is an 31 y.o. female who on last night got up to use the bathroom. After using the bathroom she became dizzy and lost consciousness for a short period of time.  Episode unwitnessed.  The patient reports that she has been having dizziness on standing since the C-section.  This is the first time she has passed out.  She reports hitting her head and bruising her forehead on the left.  She has pain at the site of this bruise.   Patient with a history of migraines.  On Topamax prior to pregnancy.    Past Medical History:  Diagnosis Date  . Anemia   . Chronic kidney disease   . Headache     Past Surgical History:  Procedure Laterality Date  . CESAREAN SECTION     x4  . CESAREAN SECTION N/A 10/07/2016   Procedure: CESAREAN SECTION;  Surgeon: Suzy Bouchard, MD;  Location: [facility] ORS;  Service: Obstetrics;  Laterality: N/A;    History reviewed. No pertinent family history.  Social History:  reports that she has quit smoking. She has never used smokeless tobacco. She reports that she does not drink alcohol or use drugs.  No Known Allergies  Medications:  I have reviewed the patient's current medications. Prior to Admission:  Prescriptions Prior to Admission  Medication Sig Dispense Refill Last Dose  . Prenatal Vit-Fe Fumarate-FA (MULTIVITAMIN-PRENATAL) 27-0.8 MG TABS tablet Take 1 tablet by mouth daily at 12 noon.   10/06/2016 at Unknown time  . cyclobenzaprine (FLEXERIL) 5 MG tablet Take 1 tablet (5 mg total) by mouth 2 (two) times daily as needed for muscle spasms. (Patient not taking: Reported on 10/07/2016) 20 tablet 0 Not Taking at Unknown time  . nitrofurantoin, macrocrystal-monohydrate, (MACROBID) 100 MG capsule Take 100 mg by mouth 3 (three) times daily.   Not Taking at Unknown time   Scheduled: . ferrous sulfate  325 mg Oral BID WC  . ibuprofen  600 mg Oral  Q6H  . medroxyPROGESTERone  150 mg Intramuscular Q90 days  . prenatal multivitamin  1 tablet Oral Q1200  . senna-docusate  2 tablet Oral Q24H  . simethicone  80 mg Oral TID PC  . Tdap  0.5 mL Intramuscular Once  . vitamin C  250 mg Oral BID WC    ROS: History obtained from the patient  General ROS: negative for - chills, fatigue, fever, night sweats, weight gain or weight loss Psychological ROS: negative for - behavioral disorder, hallucinations, memory difficulties, mood swings or suicidal ideation Ophthalmic ROS: negative for - blurry vision, double vision, eye pain or loss of vision ENT ROS: negative for - epistaxis, nasal discharge, oral lesions, sore throat, tinnitus or vertigo Allergy and Immunology ROS: negative for - hives or itchy/watery eyes Hematological and Lymphatic ROS: negative for - bleeding problems, bruising or swollen lymph nodes Endocrine ROS: negative for - galactorrhea, hair pattern changes, polydipsia/polyuria or temperature intolerance Respiratory ROS: negative for - cough, hemoptysis, shortness of breath or wheezing Cardiovascular ROS: negative for - chest pain, dyspnea on exertion, edema or irregular heartbeat Gastrointestinal ROS: negative for - abdominal pain, diarrhea, hematemesis, nausea/vomiting or stool incontinence Genito-Urinary ROS: negative for - dysuria, hematuria, incontinence or urinary frequency/urgency Musculoskeletal ROS: negative for - joint swelling or muscular weakness Neurological ROS: as noted in HPI Dermatological ROS: LE bruises  Physical Examination: Blood pressure 116/73, pulse 82, temperature 98.3 F (36.8 C),  temperature source Oral, resp. rate 16, height $R [id number]$5\' 3"period 01/26/2016, SpO2 99 %.  HEENT-  Normocephalic, no significant forehead hematoma noted, without obvious abnormality.  Normal external eye and conjunctiva.  Normal TM's bilaterally.  Normal auditory canals and external  ears. Normal external nose, mucus membranes and septum.  Normal pharynx. Cardiovascular- S1, S2 normal, pulses palpable throughout   Lungs- chest clear, no wheezing, rales, normal symmetric air entry Abdomen- soft, non-tender; bowel sounds normal; no masses,  no organomegaly Extremities- mild feet swelling Lymph-no adenopathy palpable Musculoskeletal-no joint tenderness, deformity or swelling Skin-warm and dry, no hyperpigmentation, vitiligo, or suspicious lesions  Neurological Examination Mental Status: Alert, oriented, thought content appropriate.  Speech fluent without evidence of aphasia.  Able to follow 3 step commands without difficulty. Cranial Nerves: II: Discs flat bilaterally; Visual fields grossly normal, pupils unequal (left greater than right), round, reactive to light and accommodation III,IV, VI: ptosis not present, extra-ocular motions intact bilaterally V,VII: smile symmetric, facial light touch sensation normal bilaterally VIII: hearing normal bilaterally IX,X: gag reflex present XI: bilateral shoulder shrug XII: midline tongue extension Motor: Right : Upper extremity   5/5    Left:     Upper extremity   5/5  Lower extremity   5/5     Lower extremity   5/5 Tone and bulk:normal tone throughout; no atrophy noted Sensory: Pinprick and light touch intact throughout, bilaterally Deep Tendon Reflexes: 2+ and symmetric throughout Plantars: Right: downgoing   Left: downgoing Cerebellar: Normal finger-to-nose and normal heel-to-shin testing bilaterally Gait: not tested due to safety concerns    Laboratory Studies:   Basic Metabolic Panel:  Recent Labs Lab 10/11/16 1032  NA 137  K 3.8  CL 107  CO2 25  GLUCOSE 140*  BUN 11  CREATININE 0.88  CALCIUM 8.4*    Liver Function Tests: No results for input(s): AST, ALT, ALKPHOS, BILITOT, PROT, ALBUMIN in the last 168 hours. No results for input(s): LIPASE, AMYLASE in the last 168 hours. No results for input(s):  AMMONIA in the last 168 hours.  CBC:  Recent Labs Lab 10/07/16 0547 10/08/16 0537 10/09/16 1146 10/10/16 0625 10/11/16 1032  WBC 8.4 13.1* 11.3* 14.4* 8.7  HGB 9.4* 6.7* 6.8* 10.0* 10.1*  HCT 28.7* 20.8* 20.5* 29.6* 30.2*  MCV 74.4* 73.9* 74.1* 77.3* 78.9*  PLT 270 229 222 251 266    Cardiac Enzymes: No results for input(s): CKTOTAL, CKMB, CKMBINDEX, TROPONINI in the last 168 hours.  BNP: Invalid input(s): POCBNP  CBG: No results for input(s): GLUCAP in the last 168 hours.  Microbiology: Results for orders placed or performed during the hospital encounter of 06/30/16  Chlamydia/NGC rt PCR ([facility] only)     Status: None   Collection Time: 06/30/16  3:22 PM  Result Value Ref Range Status   Specimen source GC/Chlam URINE, RANDOM  Final   Chlamydia Tr NOT DETECTED NOT DETECTED Final   N gonorrhoeae NOT DETECTED NOT DETECTED Final    Comment: (NOTE) 100  This methodology has not been evaluated in pregnant women or in 200  patients with a history of hysterectomy. 300 400  This methodology will not be performed on patients less than 2514  years of age.   Wet prep, genital     Status: Abnormal   Collection Time: 06/30/16  6:36 PM  Result Value Ref Range Status   Yeast Wet Prep HPF POC NONE SEEN NONE SEEN Final   Trich, Wet Prep  NONE SEEN NONE SEEN Final   Clue Cells Wet Prep HPF POC PRESENT (A) NONE SEEN Final   WBC, Wet Prep HPF POC MODERATE (A) NONE SEEN Final   Sperm NONE SEEN  Final    Coagulation Studies: No results for input(s): LABPROT, INR in the last 72 hours.  Urinalysis: No results for input(s): COLORURINE, LABSPEC, PHURINE, GLUCOSEU, HGBUR, BILIRUBINUR, KETONESUR, PROTEINUR, UROBILINOGEN, NITRITE, LEUKOCYTESUR in the last 168 hours.  Invalid input(s): APPERANCEUR  Lipid Panel:  No results found for: CHOL, TRIG, HDL, CHOLHDL, VLDL, LDLCALC  HgbA1C: No results found for: HGBA1C  Urine Drug Screen:     Component Value Date/Time   LABOPIA NONE DETECTED  06/30/2016 1522   COCAINSCRNUR NONE DETECTED 06/30/2016 1522   LABBENZ NONE DETECTED 06/30/2016 1522   AMPHETMU NONE DETECTED 06/30/2016 1522   THCU NONE DETECTED 06/30/2016 1522   LABBARB NONE DETECTED 06/30/2016 1522    Alcohol Level: No results for input(s): ETH in the last 168 hours.  Other results: EKG: sinus tachycardia at 111 bpm.  Imaging: No results found.   Assessment/Plan: 31 year old female with a syncopal episode s/p delivery after using the bathroom.  Episode preceded by dizziness and may very well have been vasovagal.  Episode unwitnessed though.  Patient with unequal pupils.  Unclear onset.  No evidence of orthostasis.  Anemia addressed.  Further work up recommended.    Recommendations: 1.  Head CT without contrast 2.  EEG  Thana Farr, MD Neurology [phone] 10/11/2016, 12:37 PM

## 2016-10-11 NOTE — Progress Notes (Signed)
Post Partum Day 4 Subjective: h/a , positional   Objective: Blood pressure 105/61, pulse 87, temperature 98.7 F (37.1 C), temperature source Oral, resp. rate 15, height 5\' 3"  (1.6 m), weight 198 lb (89.8 kg), last menstrual period 01/26/2016, SpO2 99 %.  Physical Exam:  General: syncopal episode yesterday s/p blood transfusion  Lochia: appropriate Uterine Fundus: firm Incision: healing well DVT Evaluation: No evidence of DVT seen on physical exam.   Recent Labs  10/09/16 1146 10/10/16 0625  HGB 6.8* 10.0*  HCT 20.5* 29.6*    Assessment/Plan: Positional h/a , probably spinal h/a  Dizziness and syncopal with hemodynamic stability  Repeat cbc . Bmp  Consult anesthesiology  Neurology consult already ordered   LOS: 4 days   SCHERMERHORN,THOMAS 10/11/2016, 10:01 AM

## 2016-10-11 NOTE — Clinical Social Work Note (Signed)
Notified by nursing staff that patient was given a car seat from the supply in the special care nursery.  York SpanielMonica Rosaleah Person MSW,LCSW [phone]

## 2016-10-12 ENCOUNTER — Inpatient Hospital Stay: Payer: Medicaid Other

## 2016-10-12 DIAGNOSIS — R569 Unspecified convulsions: Secondary | ICD-10-CM

## 2016-10-12 MED ORDER — MEDROXYPROGESTERONE ACETATE 150 MG/ML IM SUSP: 150.0000 mg | INTRAMUSCULAR | 4 refills | Status: DC

## 2016-10-12 MED ORDER — TOPIRAMATE 50 MG PO TABS: 50.0000 mg | ORAL_TABLET | Freq: Two times a day (BID) | ORAL | 4 refills | Status: DC

## 2016-10-12 MED ORDER — ONDANSETRON 4 MG PO TBDP: 4.0000 mg | ORAL_TABLET | Freq: Three times a day (TID) | ORAL | 0 refills | Status: DC | PRN

## 2016-10-12 MED ORDER — ASCORBIC ACID 250 MG PO TABS: 250.0000 mg | ORAL_TABLET | Freq: Two times a day (BID) | ORAL | 3 refills | Status: AC

## 2016-10-12 MED ORDER — DOCUSATE SODIUM 100 MG PO CAPS: 100.0000 mg | ORAL_CAPSULE | Freq: Every day | ORAL | 3 refills | Status: DC | PRN

## 2016-10-12 MED ORDER — GADOBENATE DIMEGLUMINE 529 MG/ML IV SOLN
20.0000 mL | Freq: Once | INTRAVENOUS | Status: AC | PRN
  Administered 2016-10-12: 18 mL via INTRAVENOUS

## 2016-10-12 MED ORDER — FERROUS SULFATE 325 (65 FE) MG PO TABS: 325.0000 mg | ORAL_TABLET | Freq: Two times a day (BID) | ORAL | 3 refills | Status: DC

## 2016-10-12 MED ORDER — IBUPROFEN 600 MG PO TABS: 600.0000 mg | ORAL_TABLET | Freq: Four times a day (QID) | ORAL | 0 refills | Status: DC

## 2016-10-12 MED ORDER — OXYCODONE HCL 5 MG PO TABS: 5.0000 mg | ORAL_TABLET | ORAL | 0 refills | Status: DC | PRN

## 2016-10-12 NOTE — Discharge Instructions (Signed)
Recommendations from neurology Per dr. Dalbert GarnetBeasley, use [facility] (they accept medicaid) 1.  Topamax 25mg  BID to increase to 50mg  BID in 2 weeks 2.  Follow up with neurology on an outpatient basis 3.  Patient unable to drive, operate heavy machinery, perform activities at heights and participate in water activities until release by outpatient physician.    Vasovagal Syncope, Adult Syncope, which is commonly known as fainting or passing out, is a temporary loss of consciousness. It occurs when the blood flow to the brain is reduced. Vasovagal syncope, also called neurocardiogenic syncope, is a fainting spell that happens when blood flow to the brain is reduced because of a sudden drop in heart rate and blood pressure. Vasovagal syncope is usually harmless. However, you can get injured if you fall during a fainting spell. What are the causes? This condition is caused by a drop in heart rate and blood pressure, usually in response to a trigger. Many things and situations can trigger an episode, including:  Pain.  Fear.  The sight of blood. This may occur during medical procedures, such as when blood is being drawn from a vein.  Common activities, such as coughing, swallowing, stretching, or going to the bathroom.  Emotional stress.  Being in a confined space.  Prolonged standing, especially in a warm environment.  Lack of sleep or rest.  Not eating for a long time.  Not drinking enough liquids.  Recent illness.  Drinking alcohol.  Taking drugs that affect blood pressure, such as marijuana, cocaine, opiates, or inhalants. What are the signs or symptoms? Before a fainting episode, you may:  Feel dizzy or light-headed.  Become pale.  Sense that you are going to faint.  Feel like the room is spinning.  Only see directly ahead (tunnel vision).  Feel sick to your stomach (nauseous).  See spots.  Slowly lose vision.  Hear ringing in your ears.  Have a  headache.  Feel warm and sweaty.  Feel a sensation of pins and needles. During the fainting spell, you may twitch or make jerky movements. Fainting spells usually last no longer than a few minutes before you wake up. If you get up too quickly before your body can recover, you may faint again. How is this diagnosed? This condition is diagnosed based on your symptoms, your medical history, and a physical exam. Tests may be done to rule out other causes of fainting. Tests may include:  Blood tests.  Heart tests, such as an electrocardiogram (ECG), echocardiogram, or electrophysiology study.  A test to check your response to changes in position (tilt table test). How is this treated? Usually, treatment is not needed for this condition. Your health care provider may suggest ways to help prevent fainting episodes. These may include:  Drinking additional fluids if you are exposed to a trigger.  Sitting or lying down if you notice signs that an episode is coming. If your fainting spells continue, your health care provider may recommend that you:  Take medicines to prevent fainting or to help reduce further episodes of fainting.  Do certain exercises.  Wear compression stockings.  Have surgery to place a pacemaker in your body (rare). Follow these instructions at home:  Learn to identify the signs that an episode is coming.  Sit or lie down at the first sign of a fainting spell. If you sit down, put your head down between your legs. If you lie down, swing your legs up in the air to increase blood flow to  the brain.  Avoid hot tubs and saunas.  Avoid standing for a long time. If you have to stand for a long time, try:  Crossing your legs.  Flexing and stretching your leg muscles.  Squatting.  Moving your legs.  Bending over.  Drink enough fluid to keep your urine clear or pale yellow.  Make changes to your diet that your health care provider recommends. You may be told  to:  Avoid caffeine.  Eat more salt.  Take over-the-counter and prescription medicines only as told by your health care provider. Contact a health care provider if:  You continue to have fainting spells despite treatment.  You faint more often despite treatment.  You lose consciousness for more than a few minutes.  You faint during or after exercising or after being startled.  You have twitching or jerky movements for longer than a few seconds during a fainting spell.  You have an episode of twitching or jerky movements without fainting. Get help right away if:  A fainting spell leads to an injury or bleeding.  You have new symptoms that occur with the fainting spells, such as:  Shortness of breath.  Chest pain.  Irregular heartbeat.  You twitch or make jerky movements for more than 5 minutes.  You twitch or make jerky movements during more than one fainting spell. This information is not intended to replace advice given to you by your health care provider. Make sure you discuss any questions you have with your health care provider. Document Released: 08/16/2012 Document Revised: 02/11/2016 Document Reviewed: 06/28/2015 Elsevier Interactive Patient Education  2017 [employer].

## 2016-10-12 NOTE — Progress Notes (Signed)
Subjective: Patient reports feeling better today with less dizziness.  After further conversation and on further review of the chart, the patient has had multiple syncopal events and "unexplained falls".  Objective: Current vital signs: BP 112/73 (BP Location: Left Arm)   Pulse 79   Temp 98.8 F (37.1 C) (Oral)   Resp 18   Ht 5\' 3"  (1.6 m)   Wt 89.8 kg (198 lb)   LMP 01/26/2016   SpO2 100%   Breastfeeding? Unknown   BMI 35.07 kg/m  Vital signs in last 24 hours: Temp:  [98 F (36.7 C)-98.8 F (37.1 C)] 98.8 F (37.1 C) (01/30 1056) Pulse Rate:  [74-87] 79 (01/30 1056) Resp:  [17-20] 18 (01/30 1056) BP: (110-123)/(65-77) 112/73 (01/30 1056) SpO2:  [98 %-100 %] 100 % (01/30 0725)  Intake/Output from previous day: No intake/output data recorded. Intake/Output this shift: No intake/output data recorded. Nutritional status: Diet regular Room service appropriate? Yes; Fluid consistency: Thin  Neurologic Exam: Mental Status: Alert, oriented, thought content appropriate.  Speech fluent without evidence of aphasia.  Able to follow 3 step commands without difficulty. Cranial Nerves: II: Discs flat bilaterally; Visual fields grossly normal, pupils unequal (left greater than right), round, reactive to light and accommodation III,IV, VI: ptosis not present, extra-ocular motions intact bilaterally V,VII: smile symmetric, facial light touch sensation normal bilaterally VIII: hearing normal bilaterally IX,X: gag reflex present XI: bilateral shoulder shrug XII: midline tongue extension Motor: Right :  Upper extremity   5/5                                      Left:     Upper extremity   5/5             Lower extremity   5/5                                                  Lower extremity   5/5 Tone and bulk:normal tone throughout; no atrophy noted Sensory: Pinprick and light touch intact throughout, bilaterally  Lab Results: Basic Metabolic Panel:  Recent Labs Lab 10/11/16 1032   NA 137  K 3.8  CL 107  CO2 25  GLUCOSE 140*  BUN 11  CREATININE 0.88  CALCIUM 8.4*    Liver Function Tests: No results for input(s): AST, ALT, ALKPHOS, BILITOT, PROT, ALBUMIN in the last 168 hours. No results for input(s): LIPASE, AMYLASE in the last 168 hours. No results for input(s): AMMONIA in the last 168 hours.  CBC:  Recent Labs Lab 10/07/16 0547 10/08/16 0537 10/09/16 1146 10/10/16 0625 10/11/16 1032  WBC 8.4 13.1* 11.3* 14.4* 8.7  HGB 9.4* 6.7* 6.8* 10.0* 10.1*  HCT 28.7* 20.8* 20.5* 29.6* 30.2*  MCV 74.4* 73.9* 74.1* 77.3* 78.9*  PLT 270 229 222 251 266    Cardiac Enzymes: No results for input(s): CKTOTAL, CKMB, CKMBINDEX, TROPONINI in the last 168 hours.  Lipid Panel: No results for input(s): CHOL, TRIG, HDL, CHOLHDL, VLDL, LDLCALC in the last 168 hours.  CBG: No results for input(s): GLUCAP in the last 168 hours.  Microbiology: Results for orders placed or performed during the hospital encounter of 06/30/16  Chlamydia/NGC rt PCR [facility] only)     Status: None   Collection Time: 06/30/16  3:22  PM  Result Value Ref Range Status   Specimen source GC/Chlam URINE, RANDOM  Final   Chlamydia Tr NOT DETECTED NOT DETECTED Final   N gonorrhoeae NOT DETECTED NOT DETECTED Final    Comment: (NOTE) 100  This methodology has not been evaluated in pregnant women or in 200  patients with a history of hysterectomy. 300 400  This methodology will not be performed on patients less than 74  years of age.   Wet prep, genital     Status: Abnormal   Collection Time: 06/30/16  6:36 PM  Result Value Ref Range Status   Yeast Wet Prep HPF POC NONE SEEN NONE SEEN Final   Trich, Wet Prep NONE SEEN NONE SEEN Final   Clue Cells Wet Prep HPF POC PRESENT (A) NONE SEEN Final   WBC, Wet Prep HPF POC MODERATE (A) NONE SEEN Final   Sperm NONE SEEN  Final    Coagulation Studies: No results for input(s): LABPROT, INR in the last 72 hours.  Imaging: Ct Head Wo  Contrast  Result Date: 10/11/2016 CLINICAL DATA:  Jill [name] in the bathroom 12 hours ago striking the left side of the head with loss of consciousness. EXAM: CT HEAD WITHOUT CONTRAST TECHNIQUE: Contiguous axial images were obtained from the base of the skull through the vertex without intravenous contrast. COMPARISON:  02/12/2016 FINDINGS: Brain: No evidence of malformation, atrophy, old or acute small or large vessel infarction, mass lesion, hemorrhage, hydrocephalus or extra-axial collection. No evidence of pituitary lesion. Vascular: No vascular calcification.  No hyperdense vessels. Skull: Normal.  No fracture or focal bone lesion. Sinuses/Orbits: Visualized sinuses are clear. No fluid in the middle ears or mastoids. Visualized orbits are normal. Other: None significant IMPRESSION: Normal head CT Electronically Signed   By: Paulina Fusi M.D.   On: 10/11/2016 15:27   Mr Laqueta Jean OZ Contrast  Result Date: 10/12/2016 CLINICAL DATA:  31 year old female with acute onset dizziness and loss of consciousness over night when she awoke to use the bathroom. Subsequent head injury with scalp bruising. Initial encounter. EXAM: MRI HEAD WITHOUT AND WITH CONTRAST TECHNIQUE: Multiplanar, multiecho pulse sequences of the brain and surrounding structures were obtained without and with intravenous contrast. CONTRAST:  18mL MULTIHANCE GADOBENATE DIMEGLUMINE 529 MG/ML IV SOLN COMPARISON:  Head CT without contrast 10/11/2016 and earlier. FINDINGS: Brain: Cerebral volume is normal. No restricted diffusion to suggest acute infarction. No midline shift, mass effect, evidence of mass lesion, ventriculomegaly, extra-axial collection or acute intracranial hemorrhage. Cervicomedullary junction and pituitary are within normal limits. Wallace Cullens and white matter signal is within normal limits throughout the brain. No abnormal enhancement identified. No dural thickening. Vascular: Major intracranial vascular flow voids appear normal. The venous  sinuses appear normally enhancing. Skull and upper cervical spine: Negative. Visualized bone marrow signal is within normal limits. Sinuses/Orbits: Negative. Other: Grossly normal visualized internal auditory structures. Mastoids are clear. Negative scalp soft tissues. IMPRESSION: Normal MRI appearance of the brain. Electronically Signed   By: Odessa Fleming M.D.   On: 10/12/2016 13:10    Medications:  I have reviewed the patient's current medications. Scheduled: . ferrous sulfate  325 mg Oral BID WC  . ibuprofen  600 mg Oral Q6H  . medroxyPROGESTERone  150 mg Intramuscular Q90 days  . prenatal multivitamin  1 tablet Oral Q1200  . senna-docusate  2 tablet Oral Q24H  . simethicone  80 mg Oral TID PC  . Tdap  0.5 mL Intramuscular Once  . vitamin C  250 mg  Oral BID WC    Assessment/Plan: Patient with no further events but does have a history of multiple.  EEG reviewed and shows left frontal slowing and sharp activity.  MRI of the brain reviewed with radiology and is normal.  Patient previously on Topamax.  Wold restart.  Recommendations: 1.  Topamax 25mg  BID to increase to 50mg  BID in 2 weeks 2.  Follow up with neurology on an outpatient basis 3.  Patient unable to drive, operate heavy machinery, perform activities at heights and participate in water activities until release by outpatient physician.    LOS: 5 days   Thana Farr, MD Neurology [phone] 10/12/2016  1:47 PM

## 2016-10-12 NOTE — Progress Notes (Signed)
Pt was overheard to state that she had been treated here last year to drain fluid off of her brain during her screening for MRI today.  Neurologist notified.

## 2016-10-13 NOTE — Progress Notes (Signed)
Pt discharged at this time; discharge instructions completed; pt knows when to call and make follow up appointments (OB and neurology); 1 prescription given to pt and the others sent electronically to pharmacy; pt in wheelchair (holding baby car seat on lap) and RN escorted pt to car; pt going home

## 2016-10-15 LAB — RPR: RPR Ser Ql: NONREACTIVE

## 2017-01-20 ENCOUNTER — Encounter: Payer: Self-pay | Admitting: Neurology

## 2017-04-05 ENCOUNTER — Ambulatory Visit (INDEPENDENT_AMBULATORY_CARE_PROVIDER_SITE_OTHER): Payer: Medicaid Other | Admitting: Neurology

## 2017-04-05 ENCOUNTER — Encounter: Payer: Self-pay | Admitting: Neurology

## 2017-04-05 VITALS — BP 110/78 | HR 123 | Ht 63.0 in | Wt 200.1 lb

## 2017-04-05 DIAGNOSIS — R42 Dizziness and giddiness: Secondary | ICD-10-CM | POA: Diagnosis not present

## 2017-04-05 DIAGNOSIS — R55 Syncope and collapse: Secondary | ICD-10-CM

## 2017-04-05 DIAGNOSIS — M797 Fibromyalgia: Secondary | ICD-10-CM | POA: Diagnosis not present

## 2017-04-05 DIAGNOSIS — R9401 Abnormal electroencephalogram [EEG]: Secondary | ICD-10-CM

## 2017-04-05 DIAGNOSIS — R51 Headache: Secondary | ICD-10-CM | POA: Diagnosis not present

## 2017-04-05 DIAGNOSIS — R519 Headache, unspecified: Secondary | ICD-10-CM

## 2017-04-05 MED ORDER — TOPIRAMATE 50 MG PO TABS: ORAL_TABLET | ORAL | 0 refills | Status: DC

## 2017-04-05 MED ORDER — NAPROXEN 500 MG PO TABS: 500.0000 mg | ORAL_TABLET | Freq: Two times a day (BID) | ORAL | 2 refills | Status: DC | PRN

## 2017-04-05 NOTE — Progress Notes (Signed)
NEUROLOGY CONSULTATION NOTE  Jill [name] MRN: [id number] DOB: [birth year]  Referring provider: [facility] Primary care provider: [facility]  Reason for consult:  Headaches, abnormal EEG, black out spells.  HISTORY OF PRESENT ILLNESS: Jill HamburgShaquelah N [name] is a 31 year old female who presents for headaches, abnormal EEG and possible seizures.  History of hospitalization supplemented by hospital notes.  Onset:  She has had chronic daily headache for 3 or 4 years. Location:  Bi-temporal radiating to back of head and into neck Quality:  pounding Intensity:  10/10  Aura:  no Prodrome:  no Postdrome:  no Associated symptoms:  Nausea, vomiting, photophobia, phonophobia, osmophobia.  No visual disturbance.  She has not had any new worse headache of her life Duration:  constant Frequency:  constant Frequency of abortive medication: 2 days per week Triggers/exacerbating factors:  Laying down, walking.  It is not associated with her cycle and it did not change while pregnant. Relieving factors:  no Activity:  Able to perform ADLs  In addition, she has chronic dizziness for about the same amount of time.  It is constant and not triggered by movement.  Sometimes her knees buckle from under her when she feels dizzy, although she has no loss of consciousness.  Past NSAIDS:  no Past analgesics:  oxycodone Past abortive triptans:  no Past muscle relaxants:  Flexeril Past anti-emetic:  no Past antihypertensive medications:  no Past antidepressant medications:  no Past anticonvulsant medications:  no Past vitamins/Herbal/Supplements:  no Other past therapies:  no  Current NSAIDS:  Ibuprofen Current analgesics:  Excedrin, Tylenol Current triptans:  no Current anti-emetic:  no Current muscle relaxants:  no Current anti-anxiolytic:  no Current sleep aide:  no Current Antihypertensive medications:  no Current Antidepressant medications:   no Current Anticonvulsant medications:  Topiramate 50mg  twice daily (ran out a month ago) Current Vitamins/Herbal/Supplements:  Prenatal, ferrous sulfate Current Antihistamines/Decongestants:  no Other therapy:  no Other medication:  Depo-Provera  Caffeine:  no Alcohol:  no Smoker:  no Diet:  hydrates Exercise:  yes Depression/anxiety:  no Sleep hygiene:  Poor due to headache Family history of headache:  no  [facility]16 for C-section at 36+[redacted] weeks gestation and exhibited fetal bradycardia.  About 3 days later, she got up from bed and felt dizziness and passed out in the bathroom.  It was unwitnessed and length of time of unconsciousness was unknown.  She did not bite her tongue or have urinary/bowel incontinence.  Orthostatics were reportedly normal.  CT and MRI of brain were personally reviewed and was normal.  EEG reportedly showed focal left frontal slowing and sharp activity.  She was diagnosed with seizures and started on topiramate 50mg  twice daily.  She never followed up with neurology.  She continues to have her habitual daily constant headache and dizziness.  About a month ago, she was at a store and passed out again.  She endorsed dizziness.  It was brief, possibly just seconds because she denied any witnesses around her.  She did not bite her tongue or have bowel/bladder incontinence.    She reports no history of prior seizures, febrile seizures, head trauma, or meningitis/encephalitis.  Her maternal grandmother had seizures.  She is not driving.  4/09/811/29/18 BMP with Na 137, K 3.8, CL 107, CO2 25, glucose 140, BUN 11 and Cr 0.88.  PAST MEDICAL HISTORY: Past Medical History:  Diagnosis Date  . Anemia   .  Chronic kidney disease   . Headache     PAST SURGICAL HISTORY: Past Surgical History:  Procedure Laterality Date  . CESAREAN SECTION     x4  . CESAREAN SECTION N/A 10/07/2016   Procedure: CESAREAN SECTION;  Surgeon:  Suzy Bouchard, MD;  Location: [facility] ORS;  Service: Obstetrics;  Laterality: N/A;    MEDICATIONS: Current Outpatient Prescriptions on File Prior to Visit  Medication Sig Dispense Refill  . cyclobenzaprine (FLEXERIL) 5 MG tablet Take 1 tablet (5 mg total) by mouth 2 (two) times daily as needed for muscle spasms. (Patient not taking: Reported on 10/07/2016) 20 tablet 0  . docusate sodium (COLACE) 100 MG capsule Take 1 capsule (100 mg total) by mouth daily as needed for mild constipation. 60 capsule 3  . ferrous sulfate 325 (65 FE) MG tablet Take 1 tablet (325 mg total) by mouth 2 (two) times daily with a meal. For anemia, take with Vitamin C 60 tablet 3  . ibuprofen (ADVIL,MOTRIN) 600 MG tablet Take 1 tablet (600 mg total) by mouth every 6 (six) hours. 30 tablet 0  . medroxyPROGESTERone (DEPO-PROVERA) 150 MG/ML injection Inject 1 mL (150 mg total) into the muscle every 3 (three) months. Next due 01/10/17 1 mL 4  . nitrofurantoin, macrocrystal-monohydrate, (MACROBID) 100 MG capsule Take 100 mg by mouth 3 (three) times daily.    . ondansetron (ZOFRAN-ODT) 4 MG disintegrating tablet Take 1 tablet (4 mg total) by mouth every 8 (eight) hours as needed for nausea or vomiting. 20 tablet 0  . oxyCODONE (OXY IR/ROXICODONE) 5 MG immediate release tablet Take 1 tablet (5 mg total) by mouth every 4 (four) hours as needed for moderate pain (pain scale 4-7). 20 tablet 0  . Prenatal Vit-Fe Fumarate-FA (MULTIVITAMIN-PRENATAL) 27-0.8 MG TABS tablet Take 1 tablet by mouth daily at 12 noon.     No current facility-administered medications on file prior to visit.     ALLERGIES: No Known Allergies  FAMILY HISTORY: No family history on file.  SOCIAL HISTORY: Social History   Social History  . Marital status: Single    Spouse name: N/A  . Number of children: 5  . Years of education: 12   Occupational History  . Not on file.   Social History Main Topics  . Smoking status: Former Games developer  . Smokeless  tobacco: Never Used  . Alcohol use No  . Drug use: No  . Sexual activity: Yes    Birth control/ protection: Injection   Other Topics Concern  . Not on file   Social History Narrative   Lives in a one story home with her 5 children.  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  Education: high school.      REVIEW OF SYSTEMS: Constitutional: No fevers, chills, or sweats, no generalized fatigue, change in appetite Eyes: No visual changes, double vision, eye pain Ear, nose and throat: No hearing loss, ear pain, nasal congestion, sore throat Cardiovascular: No chest pain, palpitations Respiratory:  No shortness of breath at rest or with exertion, wheezes GastrointestinaI: No nausea, vomiting, diarrhea, abdominal pain, fecal incontinence Genitourinary:  No dysuria, urinary retention or frequency Musculoskeletal:  Diffuse pain Integumentary: No rash, pruritus, skin lesions Neurological: as above Psychiatric: No depression, insomnia, anxiety Endocrine: No palpitations, fatigue, diaphoresis, mood swings, change in appetite, change in weight, increased thirst Hematologic/Lymphatic:  No purpura, petechiae. Allergic/Immunologic: no itchy/runny eyes, nasal congestion, recent allergic reactions, rashes  PHYSICAL EXAM: Vitals:   04/05/17 0752  BP: 110/78  Pulse: Marland Kitchen)  123   General: No acute distress.  Patient appears well-groomed.  Head:  Normocephalic/atraumatic Eyes:  fundi examined but not visualized Neck: supple, no paraspinal tenderness, full range of motion Back: No paraspinal tenderness Heart: regular rate and rhythm Lungs: Clear to auscultation bilaterally. Vascular: No carotid bruits. Neurological Exam: Mental status: alert and oriented to person, place, and time, recent and remote memory intact, fund of knowledge intact, attention and concentration intact, speech fluent and not dysarthric, language intact. Cranial nerves: CN I: not tested CN II: pupils equal, round and reactive to light,  visual fields intact CN III, IV, VI:  full range of motion, no nystagmus, no ptosis CN V: facial sensation intact CN VII: upper and lower face symmetric CN VIII: hearing intact CN IX, X: gag intact, uvula midline CN XI: sternocleidomastoid and trapezius muscles intact CN XII: tongue midline Bulk & Tone: normal, no fasciculations. Motor:  5/5 throughout  Sensation: temperature and vibration sensation intact. Deep Tendon Reflexes:  2+ throughout, toes downgoing.  Finger to nose testing:  Without dysmetria.  Heel to shin:  Without dysmetria.  Gait:  Cautious and slow station and stride.  Able to turn and tandem walk. Romberg negative.  IMPRESSION: 1.  Chronic daily headache, likely migraine. 2.  Chronic subjective dizziness.  May be related to migraine.  3.  Syncope and abnormal EEG.  Her black out spells sound like syncope to me rather than seizure.  The abnormal EEG may be incidental finding. 4.  She may have fibromyalgia, as she has pain to the diagnostic tender points when palpated.  PLAN: 1.  We will restart topiramate, titrating to 100mg  twice daily.  She was advised not to get pregnant and not rely solely on her birth control medication as topiramate may reduced efficacy. 2.  Stop ibuprofen, Excedrin and Tylenol.  Instead, she will try naproxen, limited to no more than 2 days out of the week.  I told her that there is no "quick fix" to stopping her headaches and that pain relievers will not be optimally effective until we can break the headaches so they are episodic.  However, the naproxen is provided in order to provide some relief when the headaches are severe. 3.  Will check CTA of head to evaluate for vertebrobasilar system. 4.  Will check sleep deprived EEG.  Will try to review EEG from January as well. 5.  She was instructed not to drive for 6 months from last loss of consciousness, as per [place] law. 6.  Follow up in 6 months.  Thank you for allowing me to take part in the care  of this patient.  Shon Millet, DO  CC:  [facility]

## 2017-04-05 NOTE — Patient Instructions (Addendum)
Although you had an abnormal EEG, I am not sure if you are experiencing seizures.  It may be simply just dizziness and fainting.  We will perform additional workup and treat headaches and potential seizures.  1)  We will start topiramate (Topamax) 50mg  tablets.  We will increase the dose as follows to goal of 100mg  twice daily:      Morning Evening Week 1:   0.5 tab  0.5 tab Week 2:    1 tab  1 tab Week 3:   1.5 tabs 1.5 tabs Week 4 and thereafter 2 tabs  2 tabs.  Possible side effects include: impaired thinking, sedation, paresthesias (numbness and tingling) and weight loss.  It may cause dehydration and there is a small risk for kidney stones, so make sure to stay hydrated with water during the day.  There is also a very small risk for glaucoma, so if you notice any change in your vision while taking this medication, see an ophthalmologist.    Topiramate may decrease the efficacy of birth control.  So I recommend using condoms and not just rely on the Depo-Provera.  Pregnancy Guidelines 1. If any medication changes are to be made, they should be completed several months before conception. Lamictal (lamotrigine) and Trileptal (oxcarbazepine) levels, in particular, need to carefully monitored (at least once a month) during pregnancy as drug levels for these two medications tend to decrease by at least 30% during pregnancy. 2. The risk of fetal malformation is increased in women taking seizure medications: However, the risk is lower in Topamax, when compared to older antiepileptic medications.  The risk is approximately 3% compared to 1-2% in the general population. 3. Women with epilepsy planning a pregnancy should take 5 mg/day of folic acid in the preconception period and throughout pregnancy. 4. Vitamin K (20 mg/day) should be used in the last month of pregnancy in women on enzyme-inducing seizure medications (such as Topamax).  Infants should receive 1 mg of vitamin K intramuscularly at birth,  to prevent hemorrhagic disease of the newborn. 5. Overbreathing (hyperventilation), sleep deprivation, pain, and emotional stress increase the risk of seizures during labor.  Consider epidural anesthesia early in the labor. 6. All currently available seizure medications can be taken while breast feeding.  2)  Stop ibuprofen, Excedrin and Tylenol.  Instead, take naproxen 500mg .   May take every 12 hours as needed.  However, you may take it no more than 2 days out of the week to prevent rebound headache.  3) Avoid activities in which a seizure would cause danger to yourself or to others.  Don't operate dangerous machinery, swim alone, or climb in high or dangerous places, such as on ladders, roofs, or girders.  Do not drive unless your doctor says you may.  4) If you have any warning that you may have a seizure, lay down in a safe place where you can't hurt yourself.    5)  No driving for 6 months from last seizure, as per [facility].   Please refer to the following link on the Epilepsy Foundation of America's website for more information: http://www.epilepsyfoundation.org/answerplace/Social/driving/drivingu.cfm   6)  Maintain good sleep hygiene.  7)  Notify your neurology if you are planning pregnancy or if you become pregnant.  8)  Contact your doctor if you have any problems that may be related to the medicine you are taking.  9)  We will check sleep deprived EEG  10)  We will also check CTA of  head.  11)  Follow up in 3 months.  8.  Call 911 and bring the patient back to the ED if:        A.  The seizure lasts longer than 5 minutes.       B.  The patient doesn't awaken shortly after the seizure  C.  The patient has new problems such as difficulty seeing, speaking or moving  D.  The patient was injured during the seizure  E.  The patient has a temperature over 102 F (39C)  F.  The patient vomited and now is having trouble breathing

## 2017-04-14 ENCOUNTER — Ambulatory Visit
Admission: RE | Admit: 2017-04-14 | Discharge: 2017-04-14 | Disposition: A | Discharge status: Home / Self Care | Payer: Medicaid Other | Source: Ambulatory Visit | Attending: Neurology | Admitting: Neurology

## 2017-04-14 ENCOUNTER — Telehealth: Payer: Self-pay

## 2017-04-14 DIAGNOSIS — R55 Syncope and collapse: Secondary | ICD-10-CM

## 2017-04-14 DIAGNOSIS — M797 Fibromyalgia: Secondary | ICD-10-CM

## 2017-04-14 DIAGNOSIS — R51 Headache: Principal | ICD-10-CM

## 2017-04-14 DIAGNOSIS — R519 Headache, unspecified: Secondary | ICD-10-CM

## 2017-04-14 DIAGNOSIS — R9401 Abnormal electroencephalogram [EEG]: Secondary | ICD-10-CM

## 2017-04-14 DIAGNOSIS — R42 Dizziness and giddiness: Secondary | ICD-10-CM

## 2017-04-14 MED ORDER — IOPAMIDOL (ISOVUE-370) INJECTION 76%
75.0000 mL | Freq: Once | INTRAVENOUS | Status: AC | PRN
  Administered 2017-04-14: 75 mL via INTRAVENOUS

## 2017-04-14 NOTE — Telephone Encounter (Signed)
Attempted to reach Pt at the number provided, vm was full, unable to leave message.

## 2017-04-14 NOTE — Telephone Encounter (Signed)
-----   Message from Van ClinesKaren M Aquino, MD sent at 04/14/2017 11:55 AM EDT ----- Pls let her know the blood vessels in brain are normal, thanks

## 2017-04-18 ENCOUNTER — Telehealth: Payer: Self-pay | Admitting: Neurology

## 2017-04-18 NOTE — Telephone Encounter (Signed)
PT called and wants to know her CT scan results

## 2017-04-18 NOTE — Telephone Encounter (Signed)
attemted to return Pt's call concerning CT results, VM is full, unable to LM

## 2017-04-18 NOTE — Telephone Encounter (Signed)
Was able to contact Pt w/ nl CT results. Confirmed EEG for 04/20/17

## 2017-04-19 NOTE — Telephone Encounter (Signed)
LM for Pt to return call

## 2017-04-20 ENCOUNTER — Other Ambulatory Visit: Payer: Medicaid Other

## 2017-04-25 ENCOUNTER — Encounter: Payer: Self-pay | Admitting: Neurology

## 2017-04-27 ENCOUNTER — Other Ambulatory Visit: Payer: Medicaid Other

## 2017-05-02 ENCOUNTER — Other Ambulatory Visit: Payer: Medicaid Other

## 2017-05-11 ENCOUNTER — Ambulatory Visit (INDEPENDENT_AMBULATORY_CARE_PROVIDER_SITE_OTHER): Payer: Medicaid Other | Admitting: Neurology

## 2017-05-11 DIAGNOSIS — R519 Headache, unspecified: Secondary | ICD-10-CM

## 2017-05-11 DIAGNOSIS — R51 Headache: Secondary | ICD-10-CM | POA: Diagnosis not present

## 2017-05-11 DIAGNOSIS — M797 Fibromyalgia: Secondary | ICD-10-CM

## 2017-05-11 DIAGNOSIS — R42 Dizziness and giddiness: Secondary | ICD-10-CM

## 2017-05-11 DIAGNOSIS — R55 Syncope and collapse: Secondary | ICD-10-CM

## 2017-05-11 DIAGNOSIS — R9401 Abnormal electroencephalogram [EEG]: Secondary | ICD-10-CM

## 2017-05-11 NOTE — Procedures (Signed)
ONE HOUR SLEEP-DEPRIVED ELECTROENCEPHALOGRAM REPORT  Date of Study: 05/11/2017  Patient's Name: Jill [name] MRN: [id number] Date of Birth: 02/12/1986  Clinical History: 31 year old woman with history of fainting and prior abnormal EEG.  Medications: Topiramate Oxycodone Zofran Depo-Provera  Technical Summary: A multichannel digital EEG recording measured by the international 10-20 system with electrodes applied with paste and impedances below 5000 ohms performed in our laboratory with EKG monitoring in an awake and asleep patient.  Hyperventilation and photic stimulation were performed.  The digital EEG was referentially recorded, reformatted, and digitally filtered in a variety of bipolar and referential montages for optimal display.    Description: The patient is awake and asleep during the recording.  During maximal wakefulness, there is a symmetric, medium voltage 11 Hz posterior dominant rhythm that attenuates with eye opening.  The record is symmetric.  During drowsiness and sleep, there is an increase in theta slowing of the background.  Vertex waves and symmetric sleep spindles were seen.  Hyperventilation and photic stimulation did not elicit any abnormalities.  There are generalized epileptiform discharges, predominantly left sided.  There are also left frontal sharp waves as well.  No electrographic seizures were seen.  EKG lead was unremarkable.  Impression: This awake and asleep EEG is abnormal due to: 1.  Generalized epileptiform discharges 2.  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  Clinical correlation advised.   Shon Millet, DO

## 2017-05-12 ENCOUNTER — Telehealth: Payer: Self-pay

## 2017-05-12 ENCOUNTER — Telehealth: Payer: Self-pay | Admitting: Neurology

## 2017-05-12 NOTE — Telephone Encounter (Signed)
Patient returned your call.  Thanks!

## 2017-05-12 NOTE — Telephone Encounter (Signed)
LM on Pts VM for rtrn call to discuss EEG results

## 2017-05-12 NOTE — Telephone Encounter (Signed)
Spoke w/Pt, advsd her to continue Topimax as EEG did show findings suggested of seizures. Pt verbalized understanding.

## 2017-05-12 NOTE — Telephone Encounter (Signed)
-----   Message from Drema DallasAdam R Jaffe, DO sent at 05/11/2017  1:29 PM EDT ----- EEG does show findings that suggest risk for seizures.  Therefore, I would continue with the topiramate which treats for both headache and seizures.

## 2017-05-13 NOTE — Telephone Encounter (Signed)
Rcvd message from after hours service. Pt called and said she was diagnosed w/epilepsy and needs refill on topamax. Called Pt, LM on VM, advsd her to call me back, that there was apparently a misunderstanding. Note also states she wants SS paperwork filled out, I advsd her yesterday that when we receive that from [facility] or her lawyer, we will address that.

## 2017-05-13 NOTE — Telephone Encounter (Signed)
Pt rtrnd call. Advsd her she was not diagnosed with epilepsy, but that the EEG indicated findings of risk for seizures and to continue topamax. Pt asked I call in refill to [facility]. Pt asked we fax info to SS, advsd her again we will address that when we rcv rqst from [facility] with a signed release from her. Pt verbalized understanding.

## 2017-05-13 NOTE — Telephone Encounter (Signed)
[facility] [place] w/Dawn, gave verbal  Rx for Topamax 50mg  #120 r-3 to take 2 tab BID

## 2017-05-23 ENCOUNTER — Telehealth: Payer: Self-pay | Admitting: Neurology

## 2017-05-23 NOTE — Telephone Encounter (Signed)
Pt called and said she needs a refill of naproxen called in

## 2017-05-24 MED ORDER — NAPROXEN 500 MG PO TABS: 500.0000 mg | ORAL_TABLET | Freq: Two times a day (BID) | ORAL | 2 refills | Status: DC | PRN

## 2017-05-24 NOTE — Telephone Encounter (Signed)
Spoke w/Pt, advsd her was given #16 naproxen on 04/05/17 2 refills and advsd to take no more than 2 days per week.Pt states she was not clear on that and has used them all. I advsd her I will send in refill, but will need to last 2 months. Pt verbalized understanding.

## 2017-06-17 ENCOUNTER — Telehealth: Payer: Self-pay | Admitting: Neurology

## 2017-06-17 MED FILL — TOPIRAMATE 50 MG TABLET: 50 | 8 days supply | Qty: 30 | Fill #0

## 2017-06-17 NOTE — Telephone Encounter (Signed)
Needs a couple of pills (Topamax) called into [facility] outpatient pharm. [phone] She is in the hospital with her daughter and can't get to [facility] to get Her meds.  Please call and advise.

## 2017-06-17 NOTE — Telephone Encounter (Signed)
Called in #30 to [facility] pharm, spoke w/Megan

## 2017-07-08 ENCOUNTER — Ambulatory Visit: Payer: Medicaid Other | Admitting: Neurology

## 2018-03-14 ENCOUNTER — Other Ambulatory Visit: Payer: Self-pay

## 2018-03-14 ENCOUNTER — Ambulatory Visit
Admission: EM | Admit: 2018-03-14 | Discharge: 2018-03-14 | Disposition: A | Discharge status: Home / Self Care | Payer: Medicaid Other | Attending: Family Medicine | Admitting: Family Medicine

## 2018-03-14 DIAGNOSIS — R42 Dizziness and giddiness: Secondary | ICD-10-CM | POA: Diagnosis not present

## 2018-03-14 DIAGNOSIS — R51 Headache: Secondary | ICD-10-CM | POA: Diagnosis not present

## 2018-03-14 DIAGNOSIS — R519 Headache, unspecified: Secondary | ICD-10-CM

## 2018-03-14 DIAGNOSIS — N189 Chronic kidney disease, unspecified: Secondary | ICD-10-CM | POA: Insufficient documentation

## 2018-03-14 MED ORDER — SUMATRIPTAN SUCCINATE 6 MG/0.5ML ~~LOC~~ SOLN
6.0000 mg | Freq: Once | SUBCUTANEOUS | Status: AC
  Administered 2018-03-14: 6 mg via SUBCUTANEOUS

## 2018-03-14 NOTE — Discharge Instructions (Addendum)
Rest. ° °Tylenol and motrin as needed. ° °Take care ° °Dr. Shelisa Fern  °

## 2018-03-14 NOTE — ED Triage Notes (Signed)
Pt states she was at work today and became lightheaded. Had blood pressure taken and was told it was high. No diagnosis of high b/p. Reports it lasted about 2-3 hours and is still slightly lightheaded. Pain in head 10/10

## 2018-03-14 NOTE — ED Provider Notes (Signed)
[facility]    CSN: [id number] Arrival date & time: 03/14/18  1856  History   Chief Complaint Chief Complaint  Patient presents with  . Dizziness   HPI  32 year old female presents with dizziness, and headache.  Patient reports that she was at work today.  She said they became lightheaded and dizzy.  She had an associated headache.  She states that she currently still has a headache.  10/10 in severity.  Located in the left temporal region and radiates posteriorly.  She reports mild photophobia.  No current nausea.  She states that her lightheadedness and dizziness lasted a few hours and then improved.  Patient states that she was seen by her nurse at work.  She was told that her blood pressure was high and that she should come in for evaluation.  Additionally, patient states that she was "shaking".  No other associated symptoms.  No other complaints.  Past Medical History:  Diagnosis Date  . Anemia   . Headache     Patient Active Problem List   Diagnosis Date Noted  . Chronic kidney disease   . Episode of loss of consciousness   . Pregnancy 10/07/2016  . Fetal bradycardia, antepartum condition or complication 10/07/2016  . Post-operative state 10/07/2016  . Vaginal bleeding in pregnancy, second trimester 06/29/2016  . Indication for care in labor or delivery 06/29/2016  . Fall as cause of accidental injury in home as place of occurrence 06/23/2016    Past Surgical History:  Procedure Laterality Date  . CESAREAN SECTION     x4  . CESAREAN SECTION N/A 10/07/2016   Procedure: CESAREAN SECTION;  Surgeon: Suzy Bouchardhomas J Schermerhorn, MD;  Location: [facility] ORS;  Service: Obstetrics;  Laterality: N/A;    OB History    Gravida  6   Para  3   Term  3   Preterm      AB  1   Living  4     SAB      TAB      Ectopic      Multiple      Live Births  4            Home Medications    Prior to Admission medications   Medication Sig Start Date End Date  Taking? Authorizing Provider  naproxen (NAPROSYN) 500 MG tablet Take 1 tablet (500 mg total) by mouth every 12 (twelve) hours as needed. No more than two days per week 05/24/17   Drema DallasJaffe, Adam R, DO  topiramate (TOPAMAX) 50 MG tablet Take 1/2 tab BID x 7 days, then 1 tab BID x 7 days, then 1.5 tab BID x 7 days, then 2 tabs BID 04/05/17   Drema DallasJaffe, Adam R, DO   Social History Social History   Tobacco Use  . Smoking status: Former Games developermoker  . Smokeless tobacco: Never Used  Substance Use Topics  . Alcohol use: No  . Drug use: No     Allergies   Patient has no known allergies.   Review of Systems Review of Systems  Eyes: Positive for photophobia.  Neurological: Positive for dizziness, light-headedness and headaches.   Physical Exam Triage Vital Signs ED Triage Vitals  Enc Vitals Group     BP 03/14/18 1906 (!) 116/93     Pulse Rate 03/14/18 1906 (!) 106     Resp 03/14/18 1906 18     Temp 03/14/18 1906 98.5 F (36.9 C)     Temp Source 03/14/18 1906  Oral     SpO2 03/14/18 1906 100 %     Weight 03/14/18 1908 260 lb (117.9 kg)     Height 03/14/18 1908 5\' 3"  (1.6 m)     Head Circumference --      Peak Flow --      Pain Score 03/14/18 1907 10     Pain Loc --      Pain Edu? --      Excl. in GC? --    Updated Vital Signs BP (!) 116/93 (BP Location: Left Arm)   Pulse (!) 106   Temp 98.5 F (36.9 C) (Oral)   Resp 18   Ht 5\' 3"  (1.6 m)   Wt 260 lb (117.9 kg)   LMP 02/21/2018   SpO2 100%   BMI 46.06 kg/m     Physical Exam  Constitutional: She is oriented to person, place, and time. She appears well-developed. No distress.  Eyes: Pupils are equal, round, and reactive to light. Conjunctivae and EOM are normal.  Cardiovascular: Normal rate and regular rhythm.  Pulmonary/Chest: Effort normal and breath sounds normal. She has no wheezes. She has no rales.  Neurological: She is alert and oriented to person, place, and time.  No apparent neurologic deficits.  Psychiatric: Her behavior  is normal.  Flat affect..  Nursing note and vitals reviewed.  UC Treatments / Results  Labs (all labs ordered are listed, but only abnormal results are displayed) Labs Reviewed - No data to display  EKG None  Radiology No results found.  Procedures Procedures (including critical care time)  Medications Ordered in UC Medications  SUMAtriptan (IMITREX) injection 6 mg (6 mg Subcutaneous Given 03/14/18 1932)    Initial Impression / Assessment and Plan / UC Course  I have reviewed the triage vital signs and the nursing notes.  Pertinent labs & imaging results that were available during my care of the patient were reviewed by me and considered in my medical decision making (see chart for details).    32 year old female presents with headache, and dizziness.  Patient reporting elevated blood pressure.  Her blood pressure is fairly well-controlled here.  Diastolic slightly elevated.  She has a history of headache and associated dizziness.  Patient states that this is similar to her prior bouts.  Per neurology, she likely has migraine headaches. Given Imitrex here.  Patient to go home and rest.  Work note given.  Final Clinical Impressions(s) / UC Diagnoses   Final diagnoses:  Acute nonintractable headache, unspecified headache type     Discharge Instructions     Rest.  Tylenol and motrin as needed.   Take care  Dr. Adriana Simas    ED Prescriptions    None     Controlled Substance Prescriptions [place] Controlled Substance Registry consulted? Not Applicable   Tommie Sams, DO 03/14/18 2017

## 2018-04-25 DIAGNOSIS — Z8669 Personal history of other diseases of the nervous system and sense organs: Secondary | ICD-10-CM | POA: Insufficient documentation

## 2018-08-14 LAB — HM PAP SMEAR: HM Pap smear: NEGATIVE

## 2018-08-14 LAB — HM HIV SCREENING LAB: HM HIV Screening: NEGATIVE

## 2018-08-21 ENCOUNTER — Ambulatory Visit
Admission: RE | Admit: 2018-08-21 | Discharge: 2018-08-21 | Disposition: A | Discharge status: Home / Self Care | Payer: Medicaid Other | Source: Ambulatory Visit | Attending: Oncology | Admitting: Oncology

## 2018-08-21 ENCOUNTER — Encounter (INDEPENDENT_AMBULATORY_CARE_PROVIDER_SITE_OTHER): Payer: Self-pay

## 2018-08-21 ENCOUNTER — Ambulatory Visit: Payer: Medicaid Other | Attending: Oncology

## 2018-08-21 VITALS — BP 143/85 | HR 114 | Temp 98.5°F | Ht 65.0 in | Wt 205.0 lb

## 2018-08-21 DIAGNOSIS — N63 Unspecified lump in unspecified breast: Secondary | ICD-10-CM

## 2018-08-21 NOTE — Progress Notes (Signed)
  Subjective:     Patient ID: Jill [name], female   DOB: [birth year], 32 y.o.   MRN: [id number]  HPI   Review of Systems     Objective:   Physical Exam  Pulmonary/Chest: Right breast exhibits mass and tenderness. Right breast exhibits no inverted nipple, no nipple discharge and no skin change. Left breast exhibits mass and tenderness. Left breast exhibits no inverted nipple, no nipple discharge and no skin change.  Large pendulous breasts         Assessment:     32 [facility].  [facility] for left breast mass with breast pain x 2 months.  Patient screened, and meets [facility] eligibility.  Patient does not have insurance, Medicare or Medicaid.  Handout given on Affordable Care Act. Instructed patient on breast self awareness using teach back method. Patient has large pendulous breasts.  Palpated breast thickening bilateral; left breast 2-3 o'clock, and right breast 8-9 o'clock . Breast pain associated bilateral.  Reports she does consume large amount of caffeinated beverage.  Encouraged to limit caffeine.  Patient has 5 children.  Mother and sister present for exam as patient requestd.      Plan:     Sent for bilateral diagnostic mammogram, and ultrasound.  Joellyn QuailsChristy Burton to give [facility] to address elevated blood pressure per patient request.

## 2018-08-21 NOTE — Progress Notes (Signed)
Radiologist discussed negative findings with patient.  Patient to return at 42 for annual mammogram.  Copy to [facility].

## 2018-09-12 ENCOUNTER — Emergency Department
Admission: EM | Admit: 2018-09-12 | Discharge: 2018-09-12 | Disposition: A | Discharge status: Home / Self Care | Payer: Medicaid Other | Attending: Emergency Medicine | Admitting: Emergency Medicine

## 2018-09-12 ENCOUNTER — Other Ambulatory Visit: Payer: Self-pay

## 2018-09-12 ENCOUNTER — Encounter: Payer: Self-pay | Admitting: Emergency Medicine

## 2018-09-12 ENCOUNTER — Emergency Department: Payer: Medicaid Other

## 2018-09-12 DIAGNOSIS — Z87891 Personal history of nicotine dependence: Secondary | ICD-10-CM | POA: Diagnosis not present

## 2018-09-12 DIAGNOSIS — O26899 Other specified pregnancy related conditions, unspecified trimester: Secondary | ICD-10-CM | POA: Diagnosis not present

## 2018-09-12 DIAGNOSIS — M545 Low back pain: Secondary | ICD-10-CM | POA: Insufficient documentation

## 2018-09-12 DIAGNOSIS — N189 Chronic kidney disease, unspecified: Secondary | ICD-10-CM | POA: Insufficient documentation

## 2018-09-12 DIAGNOSIS — O00101 Right tubal pregnancy without intrauterine pregnancy: Secondary | ICD-10-CM

## 2018-09-12 DIAGNOSIS — O00102 Left tubal pregnancy without intrauterine pregnancy: Secondary | ICD-10-CM

## 2018-09-12 DIAGNOSIS — K429 Umbilical hernia without obstruction or gangrene: Secondary | ICD-10-CM | POA: Diagnosis not present

## 2018-09-12 DIAGNOSIS — R1030 Lower abdominal pain, unspecified: Secondary | ICD-10-CM | POA: Diagnosis not present

## 2018-09-12 DIAGNOSIS — Z8759 Personal history of other complications of pregnancy, childbirth and the puerperium: Secondary | ICD-10-CM | POA: Diagnosis not present

## 2018-09-12 DIAGNOSIS — Z3A00 Weeks of gestation of pregnancy not specified: Secondary | ICD-10-CM | POA: Insufficient documentation

## 2018-09-12 LAB — URINALYSIS, COMPLETE (UACMP) WITH MICROSCOPIC
Bilirubin Urine: NEGATIVE
Glucose, UA: NEGATIVE mg/dL
Ketones, ur: NEGATIVE mg/dL
Leukocytes, UA: NEGATIVE
Nitrite: POSITIVE — AB
Protein, ur: 30 mg/dL — AB
Specific Gravity, Urine: 1.021 (ref 1.005–1.030)
pH: 5 (ref 5.0–8.0)

## 2018-09-12 LAB — LIPASE, BLOOD: Lipase: 29 U/L (ref 11–51)

## 2018-09-12 LAB — CBC
HCT: 35.3 % — ABNORMAL LOW (ref 36.0–46.0)
Hemoglobin: 11 g/dL — ABNORMAL LOW (ref 12.0–15.0)
MCH: 27.2 pg (ref 26.0–34.0)
MCHC: 31.2 g/dL (ref 30.0–36.0)
MCV: 87.2 fL (ref 80.0–100.0)
Platelets: 284 10*3/uL (ref 150–400)
RBC: 4.05 MIL/uL (ref 3.87–5.11)
RDW: 14 % (ref 11.5–15.5)
WBC: 7 10*3/uL (ref 4.0–10.5)
nRBC: 0 % (ref 0.0–0.2)

## 2018-09-12 LAB — COMPREHENSIVE METABOLIC PANEL
ALT: 11 U/L (ref 0–44)
AST: 15 U/L (ref 15–41)
Albumin: 4.1 g/dL (ref 3.5–5.0)
Alkaline Phosphatase: 67 U/L (ref 38–126)
Anion gap: 7 (ref 5–15)
BUN: 10 mg/dL (ref 6–20)
CO2: 25 mmol/L (ref 22–32)
Calcium: 9.1 mg/dL (ref 8.9–10.3)
Chloride: 107 mmol/L (ref 98–111)
Creatinine, Ser: 0.95 mg/dL (ref 0.44–1.00)
GFR calc Af Amer: >60 mL/min (ref >60)
GFR calc non Af Amer: >60 mL/min (ref >60)
Glucose, Bld: 97 mg/dL (ref 70–99)
Potassium: 3.9 mmol/L (ref 3.5–5.1)
Sodium: 139 mmol/L (ref 135–145)
Total Bilirubin: 0.7 mg/dL (ref 0.3–1.2)
Total Protein: 7.5 g/dL (ref 6.5–8.1)

## 2018-09-12 LAB — POCT PREGNANCY, URINE: Preg Test, Ur: POSITIVE — AB

## 2018-09-12 LAB — HCG, QUANTITATIVE, PREGNANCY: hCG, Beta Chain, Quant, S: 4648 m[IU]/mL — ABNORMAL HIGH (ref <5)

## 2018-09-12 MED ORDER — METHOTREXATE INJECTION FOR WOMEN'S HOSPITAL
50.0000 mg/m2 | Freq: Once | INTRAMUSCULAR | Status: AC
  Administered 2018-09-12: 100 mg via INTRAMUSCULAR
  Filled 2018-09-12: qty 2

## 2018-09-12 MED ORDER — ACETAMINOPHEN ER 650 MG PO TBCR: 650.0000 mg | EXTENDED_RELEASE_TABLET | Freq: Three times a day (TID) | ORAL | 1 refills | Status: DC | PRN

## 2018-09-12 MED ORDER — ACETAMINOPHEN 325 MG PO TABS
650.0000 mg | ORAL_TABLET | Freq: Once | ORAL | Status: AC
  Administered 2018-09-12: 650 mg via ORAL
  Filled 2018-09-12: qty 2

## 2018-09-12 MED ORDER — TRAMADOL HCL 50 MG PO TABS: 50.0000 mg | ORAL_TABLET | Freq: Four times a day (QID) | ORAL | 0 refills | Status: DC | PRN

## 2018-09-12 MED ORDER — METOCLOPRAMIDE HCL 10 MG PO TABS
10.0000 mg | ORAL_TABLET | Freq: Once | ORAL | Status: AC
  Administered 2018-09-12: 10 mg via ORAL
  Filled 2018-09-12: qty 1

## 2018-09-12 NOTE — ED Notes (Signed)
Paged Dr. Valentino Saxonherry to inform of medication hold due to not having a certified RN to administer, RN received this information from nursing supervisor Judeth CornfieldStephanie, Dr. Valentino Saxonherry requested to check if any physician will be able to administer.

## 2018-09-12 NOTE — ED Notes (Signed)
Pt verbalizes understanding of discharge instructions.

## 2018-09-12 NOTE — ED Triage Notes (Signed)
Patient reports having laproscopic surgery in august for ectopic pregnancy. Reports yesterday she started bleeding from belly button. No bleeding noted at this time. Patient also states she has pain in lower abdomen and feels like stomach is distended. Patient also complaining of lower back pain.

## 2018-09-12 NOTE — ED Notes (Signed)
See triage note  Presents with lower abd and back pain  States she noticed some bleeding from umbilicus yesterday  No fever or n/v

## 2018-09-12 NOTE — ED Notes (Signed)
Received report from Linda RN.

## 2018-09-12 NOTE — ED Notes (Signed)
This RN called nursing supervisor to inquire about certified nurse to administer medication, RN supervisor reports will find someone certified to help with medication administration

## 2018-09-12 NOTE — ED Notes (Signed)
Dr Valentino Saxonherry in with pt

## 2018-09-12 NOTE — ED Provider Notes (Signed)
[facility] Emergency Department Provider Note ____________________________________________  Time seen: 1130  I have reviewed the triage vital signs and the nursing notes.  HISTORY  Chief Complaint  Back Pain; Post-op Problem; and Abdominal Pain  HPI Jill [name] is a 32 y.o. female [phone]([id number]) with a history of absence seizures, anemia, and recent tubal pregnancy, who presents herself to the ED for evaluation of some bleeding noted to a laparoscopic port in her navel on yesterday.  Patient gives a history of a laparoscopic surgery in August for an ectopic pregnancy in the right tube.  She would also report that the umbilical hernia was repaired by the GYN provider during her laparoscopic salpingectomy. She has been well in the postop period until yesterday.  She recalls return of fullness and discomfort the umbilicus bout a week after the procedure. She has been poorly compliant, and has not followed-up with GYN, neuro, or her PCP in the interim, as suggested. She also reports some lower abdominal pain and what she describes as distention the stomach. She has not had a (normal) bowel movement in 1 week. She recalls her last menstrual period was 2-weeks prior. She also has some mild low back pain.  She denies any fevers, chills, sweats patient denies any abnormal vaginal bleeding, diarrhea, dysuria, or diarrhea.  Past Medical History:  Diagnosis Date  . Anemia   . Headache     Patient Active Problem List   Diagnosis Date Noted  . Chronic kidney disease   . Episode of loss of consciousness   . Pregnancy 10/07/2016  . Fetal bradycardia, antepartum condition or complication 10/07/2016  . Post-operative state 10/07/2016  . Vaginal bleeding in pregnancy, second trimester 06/29/2016  . Indication for care in labor or delivery 06/29/2016  . Fall as cause of accidental injury in home as place of occurrence 06/23/2016    Past Surgical History:  Procedure Laterality  Date  . CESAREAN SECTION     x4  . CESAREAN SECTION N/A 10/07/2016   Procedure: CESAREAN SECTION;  Surgeon: Suzy Bouchardhomas J Schermerhorn, MD;  Location: [facility] ORS;  Service: Obstetrics;  Laterality: N/A;    Prior to Admission medications   Medication Sig Start Date End Date Taking? Authorizing Provider  naproxen (NAPROSYN) 500 MG tablet Take 1 tablet (500 mg total) by mouth every 12 (twelve) hours as needed. No more than two days per week 05/24/17   Drema DallasJaffe, Adam R, DO  topiramate (TOPAMAX) 50 MG tablet Take 1/2 tab BID x 7 days, then 1 tab BID x 7 days, then 1.5 tab BID x 7 days, then 2 tabs BID 04/05/17   Drema DallasJaffe, Adam R, DO    Allergies Patient has no known allergies.  No family history on file.  Social History Social History   Tobacco Use  . Smoking status: Former Games developermoker  . Smokeless tobacco: Never Used  Substance Use Topics  . Alcohol use: No  . Drug use: No    Review of Systems  Constitutional: Negative for fever. Cardiovascular: Negative for chest pain. Respiratory: Negative for shortness of breath. Gastrointestinal: Positive for abdominal pain and constipation. Denies vomiting and diarrhea. Genitourinary: Negative for dysuria, abnormal vaginal bleeding or discharge. Musculoskeletal: Negative for back pain. Skin: Negative for rash. Reports bleeding from surgical site.  Neurological: Negative for headaches, focal weakness or numbness. ____________________________________________  PHYSICAL EXAM:  VITAL SIGNS: ED Triage Vitals [09/12/18 0922]  Enc Vitals Group     BP 133/81     Pulse Rate (!) 120  Resp 20     Temp 98.7 F (37.1 C)     Temp Source Oral     SpO2 100 %     Weight 202 lb (91.6 kg)     Height 5\' 3"  (1.6 m)     Head Circumference      Peak Flow      Pain Score 10     Pain Loc      Pain Edu?      Excl. in GC?     Constitutional: Alert and oriented. Well appearing and in no distress. Head: Normocephalic and atraumatic. Eyes: Conjunctivae are normal.  Normal extraocular movements Cardiovascular: Normal rate, regular rhythm. Normal distal pulses. Respiratory: Normal respiratory effort. No wheezes/rales/rhonchi. Gastrointestinal: Soft and nontender. No distention, rebound, guarding , or rigidity. Umbilical hernia noted with unstable reduction. Normoactive bowel sounds. No CVA tenderness.  GYN: Deferred. Musculoskeletal: Nontender with normal range of motion in all extremities.  Neurologic:  Normal gait without ataxia. Normal speech and language. No gross focal neurologic deficits are appreciated. Skin:  Skin is warm, dry and intact. No rash noted. Psychiatric: Mood and affect are normal. Patient exhibits appropriate insight and judgment. ____________________________________________   LABS (pertinent positives/negatives) Labs Reviewed  CBC - Abnormal; Notable for the following components:      Result Value   Hemoglobin 11.0 (*)    HCT 35.3 (*)    All other components within normal limits  URINALYSIS, COMPLETE (UACMP) WITH MICROSCOPIC - Abnormal; Notable for the following components:   Color, Urine YELLOW (*)    APPearance CLOUDY (*)    Hgb urine dipstick SMALL (*)    Protein, ur 30 (*)    Nitrite POSITIVE (*)    Bacteria, UA MANY (*)    All other components within normal limits  HCG, QUANTITATIVE, PREGNANCY - Abnormal; Notable for the following components:   hCG, Beta Chain, Quant, S 4,648 (*)    All other components within normal limits  POCT PREGNANCY, URINE - Abnormal; Notable for the following components:   Preg Test, Ur POSITIVE (*)    All other components within normal limits  LIPASE, BLOOD  COMPREHENSIVE METABOLIC PANEL  POC URINE PREG, ED  ____________________________________________  RADIOLOGY  Transvaginal OB US <14 weeks  IMPRESSION: No intrauterine gestational sac is identified.  In the left adnexa, there is a 5.7 mm cystic area, an ectopic pregnancy is not  excluded. ____________________________________________  PROCEDURES  Tylenol 650 mg PO Reglan 10 mg PO Methotrexate 100 mg IM _____________________________________________  INITIAL IMPRESSION / ASSESSMENT AND PLAN / ED COURSE  Differential diagnosis includes, but is not limited to, ovarian cyst, ovarian torsion, acute appendicitis, diverticulitis, urinary tract infection/pyelonephritis, endometriosis, bowel obstruction, colitis, renal colic, gastroenteritis, hernia, fibroids, endometriosis, pregnancy related pain including ectopic pregnancy, etc.  Patient with ED evaluation of lower abdominal pain, umbilical hernia, and lower back pain. She was found to have an elevated beta hcg. Ultrasound reveals a left adnexal cyst concerning for early ectopic. Patient is notified of the results, and will await consult from OBG.  ----------------------------------------- 4:01 PM on 09/12/2018 ----------------------------------------- S/w Dr. Valentino Saxon (OBG), she will consult the patient in the ED after clinic hours.   ----------------------------------------- 5:40 PM on 09/12/2018 ----------------------------------------- Patient was counseled by Dr. Valentino Saxon her about surgical v non-surgical management of her suspected left tubal pregnancy. She has opted for methotrexate (MTX) administration and outpatient follow-up. She will see Dr. Valentino Saxon for repeat beta quant levels.   She will be referred to general surgery for  consultation of repair of her umbilical hernia.  ____________________________________________  FINAL CLINICAL IMPRESSION(S) / ED DIAGNOSES  Final diagnoses:  Left tubal pregnancy without intrauterine pregnancy  Umbilical hernia without obstruction and without gangrene      Lissa HoardMenshew, Jaymond Waage V Bacon, PA-C 09/12/18 1753    Arnaldo NatalMalinda, Paul F, MD 09/12/18 [phone]

## 2018-09-12 NOTE — Discharge Instructions (Addendum)
Ectopic Pregnancy ° °An ectopic pregnancy happens when a fertilized egg grows outside the womb (uterus). The fertilized egg cannot stay alive outside of the womb. This problem often happens in a fallopian tube. It is often caused by damage to the tube. °If this problem is found early, you may be treated with medicine that stops the egg from growing. If your tube tears or bursts open (ruptures), you will bleed inside. Often, there is very bad pain in the lower belly. This is an emergency. You will need surgery. Get help right away. °Follow these instructions at home: °After being treated with medicine or surgery: °· Rest and limit your activity for as long as told by your doctor. °· Until your doctor says that it is safe: °? Do not lift anything that is heavier than 10 lb (4.5 kg) or the limit that your doctor tells you. °? Avoid exercise and any movement that takes a lot of effort. °· To prevent problems when pooping (constipation): °? Eat a healthy diet. This includes: °§ Fruits. °§ Vegetables. °§ Whole grains. °? Drink 6-8 glasses of water a day. °Contact a doctor if: °Get help right away if: °· You have sudden and very bad pain in your belly. °· You have very bad pain in your shoulders or neck. °· You have pain that gets worse and is not helped by medicine. °· You have: °? A fever or chills. °? Vaginal bleeding. °? Redness or swelling at the site of a surgical cut (incision). °· You feel sick to your stomach (nauseous) or you throw up (vomit). °· You feel dizzy or weak. °· You feel light-headed or you pass out (faint). °Summary °· An ectopic pregnancy happens when a fertilized egg grows outside the womb (uterus). °· If this problem is found early, you may be treated with medicine that stops the egg from growing. °· If your tube tears or bursts open (ruptures), you will need surgery. This is an emergency. Get help right away. °This information is not intended to replace advice given to you by your health care  provider. Make sure you discuss any questions you have with your health care provider. °Document Released: 11/26/2008 Document Revised: 09/23/2016 Document Reviewed: 09/23/2016 °Elsevier Interactive Patient Education © 2019 [employer] °Methotrexate Treatment for an Ectopic Pregnancy ° °Methotrexate is a medicine that treats an ectopic pregnancy. An ectopic pregnancy is a pregnancy in which the fetus develops outside the uterus. This kind of pregnancy can be dangerous. Methotrexate works by stopping the growth of the fertilized egg. It also helps your body absorb tissue from the egg. This takes between 2-6 weeks. Most ectopic pregnancies can be successfully treated with methotrexate if they are diagnosed early. °Tell a health care provider about: °· Any allergies you have. °· All medicines you are taking, including vitamins, herbs, eye drops, creams, and over-the-counter medicines. °· Any medical conditions you have. °What are the risks? °Generally, this is a safe treatment. However, problems may occur, including: °· Nausea or vomiting or both. °· Vaginal bleeding or spotting. °· Diarrhea. °· Abdominal cramping. °· Dizziness or feeling lightheaded. °· Mouth sores. °· Swelling or irritation of the lining of your lungs (pneumonitis). °· Liver damage. °· Hair loss. °There is a risk that methotrexate treatment will fail and your pregnancy will continue. There is also a risk that the ectopic pregnancy might rupture while you are using this medicine. °What happens before the procedure? °· Liver tests, kidney tests, and a complete blood test will   be done. °· Blood tests will be done to measure the pregnancy hormone levels and to determine your blood type. °· If you are Rh-negative and the father is Rh-positive or his Rh type is not known, you will be given a Rho (D) immune globulin shot. °What happens during the procedure? °Your health care provider may give you methotrexate by injection or in the form of a pill.  Methotrexate may be given as a single dose of medicine or a series of doses, depending on your response to the treatment. °· Methotrexate injections will be given by your health care provider. This is the most common way that methotrexate is used to treat an ectopic pregnancy. °· If you are prescribed oral methotrexate, it is very important that you follow your health care provider's instructions on how to take oral methotrexate. °Additional medicines may be needed to manage an ectopic pregnancy. °The procedure may vary among health care providers and hospitals. °What happens after the procedure? °· You may have abdominal cramping, vaginal bleeding, and fatigue. °· Blood tests will be taken at timed intervals for several days or weeks to check your pregnancy hormone levels. The blood tests will be done until the pregnancy hormone can no longer be detected in the blood. °· You may need to have a surgical procedure to remove the ectopic pregnancy if methotrexate treatment fails. °· Follow instructions from your health care provider on how and when to report any symptoms that may indicate a ruptured ectopic pregnancy. °Summary °· Methotrexate is a medicine that treats an ectopic pregnancy. °· Methotrexate may be given in a single dose or a series of doses over time. °· Blood tests will be taken at timed intervals for several days or weeks to check your pregnancy hormone levels. The blood tests will be done until no more pregnancy hormone is detected in the blood. °· There is a risk that methotrexate treatment will fail and your pregnancy will continue. There is also a risk that the ectopic pregnancy might rupture while you are using this medicine. °This information is not intended to replace advice given to you by your health care provider. Make sure you discuss any questions you have with your health care provider. °Document Released: 08/24/2001 Document Revised: 10/19/2016 Document Reviewed: 10/19/2016 °Elsevier  Interactive Patient Education © 2019 [employer]. ° °

## 2018-09-12 NOTE — Consult Note (Addendum)
Reason for Consult: Suspected ectopic pregnancy Referring Physician: Dorothea Glassman, MD  Jill [name] is an 32 y.o. [phone]  female who presented to the Emergency Room due to complaints of bleeding at her laparoscopic port site since yesterday along with fullness and discomfort in the abdomen and back.  She reports a history of umbilical hernia repair and laparoscopic ectopic pregnancy surgery (removal of right tube) performed at [facility] in August. She also endorsed constipation, with no bowel movement x 1 week. She denies nausea, vomiting, dysuria, or vaginal bleeding. She states her last cycle was ~ 2 weeks ago. Pregnancy test in the Emergency Room was noted to be positive, with no visible IUP noted on ultrasound, along with right adnexal mass concerning for ectopic.   Pertinent Gynecological History: Menses: reported to be regular, flow is moderate.  Contraception: none Blood transfusions: none Sexually transmitted diseases: past history: trichomoniasis, diagnosed 04/2018, treated.     Menstrual History: Menarche age: 8 Patient's last menstrual period was 09/02/2018 (approximate).    OB History  Gravida Para Term Preterm AB Living  7 4 4   2 4   SAB TAB Ectopic Multiple Live Births      1   4    # Outcome Date GA Lbr Len/2nd Weight Sex Delivery Anes PTL Lv  7 Current           6 Ectopic 04/2018          5 Term 05/14/14    M CS-LTranv   LIV  4 Term 10/20/12    F CS-LTranv  N LIV  3 Term 11/27/07    F CS-LTranv  N LIV  2 Term 06/17/05    M CS-LTranv  N LIV  1 AB             Obstetric Comments  G6 - ruptured ectopic s/p left salpingectomy    Past Medical History:  Diagnosis Date  . Anemia   . Headache     Past Surgical History:  Procedure Laterality Date  . CESAREAN SECTION     x4  . CESAREAN SECTION N/A 10/07/2016   Procedure: CESAREAN SECTION;  Surgeon: Suzy Bouchard, MD;  Location: [facility] ORS;  Service: Obstetrics;  Laterality: N/A;    Family History   Problem Relation Age of Onset  . Healthy Mother   . Healthy Father   . Cancer Neg Hx   . Thyroid disease Neg Hx   . Diabetes Neg Hx     Social History:  reports that she has quit smoking. She has never used smokeless tobacco. She reports that she does not drink alcohol or use drugs.  Allergies: No Known Allergies  Medications: I have reviewed the patient's current medications.  No current facility-administered medications on file prior to encounter.    Current Outpatient Medications on File Prior to Encounter  Medication Sig Dispense Refill  . naproxen (NAPROSYN) 500 MG tablet Take 1 tablet (500 mg total) by mouth every 12 (twelve) hours as needed. No more than two days per week 16 tablet 2  . topiramate (TOPAMAX) 50 MG tablet Take 1/2 tab BID x 7 days, then 1 tab BID x 7 days, then 1.5 tab BID x 7 days, then 2 tabs BID 120 tablet 0     Review of Systems  Constitutional: Negative for chills, fever and weight loss.  HENT: Negative.   Eyes: Negative.   Respiratory: Negative for cough, sputum production and shortness of breath.   Cardiovascular: Negative for chest  pain, palpitations and leg swelling.  Skin: Negative for itching and rash.  Neurological: Negative for dizziness, sensory change, weakness and headaches.  Endo/Heme/Allergies: Negative for environmental allergies. Does not bruise/bleed easily.  Psychiatric/Behavioral: Negative.     Blood pressure 113/80, pulse (!) 111, temperature 98.1 F (36.7 C), temperature source Oral, resp. rate 20, height 5\' 3"  (1.6 m), weight 91.6 kg, last menstrual period 09/02/2018, SpO2 98 %. Physical Exam  Constitutional: She is oriented to person, place, and time. She appears well-developed and well-nourished. No distress.  HENT:  Head: Normocephalic and atraumatic.  Mouth/Throat: No oropharyngeal exudate.  Eyes: Pupils are equal, round, and reactive to light. Conjunctivae and EOM are normal. Right eye exhibits no discharge. Left eye  exhibits no discharge. No scleral icterus.  Neck: Normal range of motion. Neck supple. No tracheal deviation present. No thyromegaly present.  Cardiovascular: Normal rate, regular rhythm and normal heart sounds. Exam reveals no gallop and no friction rub.  No murmur heard. Respiratory: Effort normal and breath sounds normal. No respiratory distress. She has no wheezes.  GI: Soft. She exhibits no distension and no mass. There is abdominal tenderness. There is no rebound and no guarding.  + mild tenderness at umbilicus where umbilical hernia is present, reducible. Also mild tenderness in lower abdomen near midline.   Genitourinary:    Genitourinary Comments: Deferred, per ER physician exam   Musculoskeletal: Normal range of motion.        General: No tenderness, deformity or edema.  Lymphadenopathy:    She has no cervical adenopathy.  Neurological: She is alert and oriented to person, place, and time.  Skin: Skin is warm and dry. No rash noted. No erythema.  Psychiatric: She has a normal mood and affect. Her behavior is normal. Thought content normal.    Results for orders placed or performed during the hospital encounter of 09/12/18 (from the past 48 hour(s))  Lipase, blood     Status: None   Collection Time: 09/12/18  9:27 AM  Result Value Ref Range   Lipase 29 11 - 51 U/L    Comment: Performed at [facility], [address] [place], [place] [address]  Comprehensive metabolic panel     Status: None   Collection Time: 09/12/18  9:27 AM  Result Value Ref Range   Sodium 139 135 - 145 mmol/L   Potassium 3.9 3.5 - 5.1 mmol/L   Chloride 107 98 - 111 mmol/L   CO2 25 22 - 32 mmol/L   Glucose, Bld 97 70 - 99 mg/dL   BUN 10 6 - 20 mg/dL   Creatinine, Ser 6.040.95 0.44 - 1.00 mg/dL   Calcium 9.1 8.9 - 54.010.3 mg/dL   Total Protein 7.5 6.5 - 8.1 g/dL   Albumin 4.1 3.5 - 5.0 g/dL   AST 15 15 - 41 U/L   ALT 11 0 - 44 U/L   Alkaline Phosphatase 67 38 - 126 U/L   Total Bilirubin 0.7 0.3  - 1.2 mg/dL   GFR calc non Af Amer >60 >60 mL/min   GFR calc Af Amer >60 >60 mL/min   Anion gap 7 5 - 15    Comment: Performed at [facility], [address] [place], [place] [address]  CBC     Status: Abnormal   Collection Time: 09/12/18  9:27 AM  Result Value Ref Range   WBC 7.0 4.0 - 10.5 K/uL   RBC 4.05 3.87 - 5.11 MIL/uL   Hemoglobin 11.0 (L) 12.0 -  15.0 g/dL   HCT 16.135.3 (L) 09.636.0 - 04.546.0 %   MCV 87.2 80.0 - 100.0 fL   MCH 27.2 26.0 - 34.0 pg   MCHC 31.2 30.0 - 36.0 g/dL   RDW 40.914.0 81.111.5 - 91.415.5 %   Platelets 284 150 - 400 K/uL   nRBC 0.0 0.0 - 0.2 %    Comment: Performed at [facility], [address] [place], [place] [address]  hCG, quantitative, pregnancy     Status: Abnormal   Collection Time: 09/12/18  9:27 AM  Result Value Ref Range   hCG, Beta Chain, Quant, S 4,648 (H) <5 mIU/mL    Comment:          GEST. AGE      CONC.  (mIU/mL)   <=1 WEEK        5 - 50     2 WEEKS       50 - 500     3 WEEKS       100 - 10,000     4 WEEKS     1,000 - 30,000     5 WEEKS     3,500 - 115,000   6-8 WEEKS     12,000 - 270,000    12 WEEKS     15,000 - 220,000        FEMALE AND NON-PREGNANT FEMALE:     LESS THAN 5 mIU/mL Performed at [facility], [address] [place], [place] [address]   Urinalysis, Complete w Microscopic     Status: Abnormal   Collection Time: 09/12/18  9:28 AM  Result Value Ref Range   Color, Urine YELLOW (A) YELLOW   APPearance CLOUDY (A) CLEAR   Specific Gravity, Urine 1.021 1.005 - 1.030   pH 5.0 5.0 - 8.0   Glucose, UA NEGATIVE NEGATIVE mg/dL   Hgb urine dipstick SMALL (A) NEGATIVE   Bilirubin Urine NEGATIVE NEGATIVE   Ketones, ur NEGATIVE NEGATIVE mg/dL   Protein, ur 30 (A) NEGATIVE mg/dL   Nitrite POSITIVE (A) NEGATIVE   Leukocytes, UA NEGATIVE NEGATIVE   RBC / HPF 0-5 0 - 5 RBC/hpf   WBC, UA 6-10 0 - 5 WBC/hpf   Bacteria, UA MANY (A) NONE SEEN   Squamous Epithelial / LPF 21-50 0 - 5   Mucus PRESENT     Comment:  Performed at [facility], [address] [place], [place] [address]  Pregnancy, urine POC     Status: Abnormal   Collection Time: 09/12/18 12:16 PM  Result Value Ref Range   Preg Test, Ur POSITIVE (A) NEGATIVE    Comment:        THE SENSITIVITY OF THIS METHODOLOGY IS >24 mIU/mL     Lab Results  Component Value Date   ABORH O POS 05/18/2016     [place] Ob Comp Less 14 Wks  Result Date: 09/12/2018 CLINICAL DATA:  Pelvic pain for 1 day. The patient has a history of ectopic pregnancy in August of 2019. EXAM: OBSTETRIC <14 WK US AND TRANSVAGINAL OB US TECHNIQUE: Both transabdominal and transvaginal ultrasound examinations were performed for complete evaluation of the gestation as well as the maternal uterus, adnexal regions, and pelvic cul-de-sac. Transvaginal technique was performed to assess early pregnancy. COMPARISON:  None. FINDINGS: Intrauterine gestational sac: None Yolk sac:  Not Visualized. Embryo:  Not Visualized. Cardiac Activity: Not Visualized. Heart Rate: Does not apply bpm MSD:   mm    w     d CRL:  mm    w    d                  [place] EDC: Subchorionic hemorrhage:  None visualized. Maternal uterus/adnexae: In the left adnexa, there is a 5.7 mm cystic area. An ectopic pregnancy is not excluded. The right ovary is normal. IMPRESSION: No intrauterine gestational sac is identified. In the left adnexa, there is a 5.7 mm cystic area, an ectopic pregnancy is not excluded. Electronically Signed   By: Sherian Rein M.D.   On: 09/12/2018 15:40   US Ob Transvaginal  Result Date: 09/12/2018 CLINICAL DATA:  Pelvic pain for 1 day. The patient has a history of ectopic pregnancy in August of 2019. EXAM: OBSTETRIC <14 WK [place] AND TRANSVAGINAL OB US TECHNIQUE: Both transabdominal and transvaginal ultrasound examinations were performed for complete evaluation of the gestation as well as the maternal uterus, adnexal regions, and pelvic cul-de-sac. Transvaginal technique was performed to assess  early pregnancy. COMPARISON:  None. FINDINGS: Intrauterine gestational sac: None Yolk sac:  Not Visualized. Embryo:  Not Visualized. Cardiac Activity: Not Visualized. Heart Rate: Does not apply bpm MSD:   mm    w     d CRL:    mm    w    d                  [place] EDC: Subchorionic hemorrhage:  None visualized. Maternal uterus/adnexae: In the left adnexa, there is a 5.7 mm cystic area. An ectopic pregnancy is not excluded. The right ovary is normal. IMPRESSION: No intrauterine gestational sac is identified. In the left adnexa, there is a 5.7 mm cystic area, an ectopic pregnancy is not excluded. Electronically Signed   By: Sherian Rein M.D.   On: 09/12/2018 15:40    Assessment/Plan: 1. Ectopic Pregnancy - Discussed management of ectopic pregnancy: expectant management (not recommended) vs methotrexate vs laparoscopic salpingectomy/salpingostomy.  Risks and benefits of all modalities discussed; all questions answered. Reviewed ultrasound imaging with patient and her mother at their request. Patient opted for medication management with methotrexate.  Discussed possible side effects of medication, possibility of failure of medication to resolve pregnancy or requiring multiple doses, as well as possible rupture of the ectopic pregnancy.  Also strongly emphasized then need for close follow up.  Patient notes understanding.  Methotrexate to be given in ER.  Will f/u in office in 3 days for next BHCG level.  Given strict bleeding, ectopic precautions. Can discharge home with a prescription for Ultram and Tylenol.  - Also discussed increasing risk of subsequent ectopic pregnancies with patient as she still notes that she desires future childbearing.   2. Umbilical hernia - Patient with h/o hernia repair in August at time of her ectopic surgery. Still with a significant umbilical hernia present, reducible.  Patient notes that she desires for it to be corrected. Will refer to General Surgeon (on review of last operative  note from [facility], it appears that General Surgery was consulted and agreed for need for repair, however Gynecology repaired the hernia).   A total of 45 minutes were spent face-to-face with the patient during the encounter with greater than 50% dealing with counseling and coordination of care.   Hildred Laser, MD 09/12/2018

## 2018-09-12 NOTE — ED Notes (Signed)
Back from u/s  Family remains at bedside

## 2018-09-13 ENCOUNTER — Other Ambulatory Visit: Payer: Self-pay

## 2018-09-13 ENCOUNTER — Encounter: Payer: Self-pay | Admitting: Obstetrics and Gynecology

## 2018-09-13 ENCOUNTER — Encounter: Payer: Self-pay | Admitting: Emergency Medicine

## 2018-09-13 ENCOUNTER — Observation Stay
Admission: EM | Admit: 2018-09-13 | Discharge: 2018-09-14 | Disposition: A | Discharge status: Home / Self Care | Payer: Medicaid Other | Attending: Specialist | Admitting: Specialist

## 2018-09-13 DIAGNOSIS — Z791 Long term (current) use of non-steroidal anti-inflammatories (NSAID): Secondary | ICD-10-CM | POA: Insufficient documentation

## 2018-09-13 DIAGNOSIS — Z87891 Personal history of nicotine dependence: Secondary | ICD-10-CM | POA: Diagnosis not present

## 2018-09-13 DIAGNOSIS — O0883 Urinary tract infection following an ectopic and molar pregnancy: Secondary | ICD-10-CM | POA: Insufficient documentation

## 2018-09-13 DIAGNOSIS — O009 Unspecified ectopic pregnancy without intrauterine pregnancy: Principal | ICD-10-CM | POA: Insufficient documentation

## 2018-09-13 DIAGNOSIS — O99350 Diseases of the nervous system complicating pregnancy, unspecified trimester: Secondary | ICD-10-CM | POA: Diagnosis not present

## 2018-09-13 DIAGNOSIS — R569 Unspecified convulsions: Secondary | ICD-10-CM | POA: Diagnosis present

## 2018-09-13 DIAGNOSIS — G40409 Other generalized epilepsy and epileptic syndromes, not intractable, without status epilepticus: Secondary | ICD-10-CM | POA: Insufficient documentation

## 2018-09-13 DIAGNOSIS — D649 Anemia, unspecified: Secondary | ICD-10-CM | POA: Diagnosis not present

## 2018-09-13 HISTORY — DX: Unspecified convulsions: R56.9

## 2018-09-13 LAB — COMPREHENSIVE METABOLIC PANEL
ALT: 12 U/L (ref 0–44)
AST: 16 U/L (ref 15–41)
Albumin: 4.1 g/dL (ref 3.5–5.0)
Alkaline Phosphatase: 69 U/L (ref 38–126)
Anion gap: 7 (ref 5–15)
BUN: 11 mg/dL (ref 6–20)
CO2: 23 mmol/L (ref 22–32)
Calcium: 9.1 mg/dL (ref 8.9–10.3)
Chloride: 107 mmol/L (ref 98–111)
Creatinine, Ser: 0.9 mg/dL (ref 0.44–1.00)
GFR calc Af Amer: >60 mL/min (ref >60)
GFR calc non Af Amer: >60 mL/min (ref >60)
Glucose, Bld: 104 mg/dL — ABNORMAL HIGH (ref 70–99)
Potassium: 3.6 mmol/L (ref 3.5–5.1)
Sodium: 137 mmol/L (ref 135–145)
Total Bilirubin: 0.4 mg/dL (ref 0.3–1.2)
Total Protein: 7.3 g/dL (ref 6.5–8.1)

## 2018-09-13 LAB — CBC
HCT: 34.3 % — ABNORMAL LOW (ref 36.0–46.0)
Hemoglobin: 10.7 g/dL — ABNORMAL LOW (ref 12.0–15.0)
MCH: 26.6 pg (ref 26.0–34.0)
MCHC: 31.2 g/dL (ref 30.0–36.0)
MCV: 85.1 fL (ref 80.0–100.0)
Platelets: 315 10*3/uL (ref 150–400)
RBC: 4.03 MIL/uL (ref 3.87–5.11)
RDW: 13.8 % (ref 11.5–15.5)
WBC: 8 10*3/uL (ref 4.0–10.5)
nRBC: 0 % (ref 0.0–0.2)

## 2018-09-13 NOTE — ED Triage Notes (Signed)
Pt arrived to the ED via EMS from home post ictal after having a witnessed seizure. According to EMS the Pt's family reported that the Pt was seen in this ED for an ectopic pregnancy yesterday and was given medication to terminate the pregnancy. Pt has a history of seizures and takes "Topamax" for them Pt is AOx4 upon arrival.

## 2018-09-13 NOTE — ED Provider Notes (Signed)
[facility] Emergency Department Provider Note   First MD Initiated Contact with Patient 09/13/18 2228     (approximate)  I have reviewed the triage vital signs and the nursing notes.   HISTORY  Chief Complaint Seizures    HPI Jill [name] is a 33 y.o. female with below list of chronic medical conditions including known seizure disorder and recently diagnosed left ectopic pregnancy for which the patient was treated with methotrexate yesterday presents to the emergency department via EMS following witnessed seizure.  Patient is prescribed Topamax for her seizures which she admits to taking as prescribed.   Past Medical History:  Diagnosis Date  . Anemia   . Headache   . Seizures [facility](HCC)     Patient Active Problem List   Diagnosis Date Noted  . Chronic kidney disease   . Episode of loss of consciousness   . Pregnancy 10/07/2016  . Fetal bradycardia, antepartum condition or complication 10/07/2016  . Post-operative state 10/07/2016  . Vaginal bleeding in pregnancy, second trimester 06/29/2016  . Indication for care in labor or delivery 06/29/2016  . Fall as cause of accidental injury in home as place of occurrence 06/23/2016    Past Surgical History:  Procedure Laterality Date  . CESAREAN SECTION     x4  . CESAREAN SECTION N/A 10/07/2016   Procedure: CESAREAN SECTION;  Surgeon: Suzy Bouchardhomas J Schermerhorn, MD;  Location: [facility] ORS;  Service: Obstetrics;  Laterality: N/A;    Prior to Admission medications   Medication Sig Start Date End Date Taking? Authorizing Provider  acetaminophen (TYLENOL 8 HOUR) 650 MG CR tablet Take 1 tablet (650 mg total) by mouth every 8 (eight) hours as needed for pain (mild pain). 09/12/18   Hildred Laserherry, Anika, MD  naproxen (NAPROSYN) 500 MG tablet Take 1 tablet (500 mg total) by mouth every 12 (twelve) hours as needed. No more than two days per week 05/24/17   Drema DallasJaffe, Adam R, DO  topiramate (TOPAMAX) 50 MG tablet Take 1/2 tab  BID x 7 days, then 1 tab BID x 7 days, then 1.5 tab BID x 7 days, then 2 tabs BID 04/05/17   Jaffe, Adam R, DO  traMADol (ULTRAM) 50 MG tablet Take 1 tablet (50 mg total) by mouth every 6 (six) hours as needed for moderate pain (may take 2 tabs q 6 hr for severe pain). 09/12/18 09/12/19  Hildred Laserherry, Anika, MD    Allergies No Known Drug Allergies  Family History  Problem Relation Age of Onset  . Healthy Mother   . Healthy Father   . Cancer Neg Hx   . Thyroid disease Neg Hx   . Diabetes Neg Hx     Social History Social History   Tobacco Use  . Smoking status: Former Games developermoker  . Smokeless tobacco: Never Used  Substance Use Topics  . Alcohol use: No  . Drug use: No    Review of Systems Constitutional: No fever/chills Eyes: No visual changes. ENT: No sore throat. Cardiovascular: Denies chest pain. Respiratory: Denies shortness of breath. Gastrointestinal: No abdominal pain.  No nausea, no vomiting.  No diarrhea.  No constipation. Genitourinary: Negative for dysuria. Musculoskeletal: Negative for neck pain.  Negative for back pain. Integumentary: Negative for rash. Neurological: Negative for headaches, focal weakness or numbness.  ____________________________________________   PHYSICAL EXAM:  VITAL SIGNS: ED Triage Vitals  Enc Vitals Group     BP 09/13/18 2222 (!) 132/93     Pulse Rate 09/13/18 2222 (!) 106  Resp 09/13/18 2222 (!) 22     Temp 09/13/18 2222 98.2 F (36.8 C)     Temp Source 09/13/18 2222 Oral     SpO2 09/13/18 2222 100 %     Weight 09/13/18 2224 91.6 kg (201 lb 15.1 oz)     Height 09/13/18 2224 1.6 m (5\' 3" )     Head Circumference --      Peak Flow --      Pain Score 09/13/18 2223 10     Pain Loc --      Pain Edu? --      Excl. in GC? --     Constitutional: Alert and oriented. Well appearing and in no acute distress. Eyes: Conjunctivae are normal. PERRL. EOMI. Head: Atraumatic. Mouth/Throat: Mucous membranes are moist. Oropharynx  non-erythematous. Neck: No stridor.   Cardiovascular: Normal rate, regular rhythm. Good peripheral circulation. Grossly normal heart sounds. Respiratory: Normal respiratory effort.  No retractions. Lungs CTAB. Gastrointestinal: Soft and nontender. No distention.  Musculoskeletal: No lower extremity tenderness nor edema. No gross deformities of extremities. Neurologic:  Normal speech and language. No gross focal neurologic deficits are appreciated.  Skin:  Skin is warm, dry and intact. No rash noted. Psychiatric: Mood and affect are normal. Speech and behavior are normal.  ____________________________________________   LABS (all labs ordered are listed, but only abnormal results are displayed)  Labs Reviewed  CBC - Abnormal; Notable for the following components:      Result Value   Hemoglobin 10.7 (*)    HCT 34.3 (*)    All other components within normal limits  COMPREHENSIVE METABOLIC PANEL  TOPIRAMATE LEVEL   ____________________________________________  EKG  ED ECG REPORT I, Kincaid N BROWN, the attending physician, personally viewed and interpreted this ECG.   Date: 09/13/2018  EKG Time: 10:28 PM  Rate: 108  Rhythm: Sinus tachycardia  Axis: Normal  Intervals: Normal  ST&T Change: None    Procedures   ____________________________________________   INITIAL IMPRESSION / ASSESSMENT AND PLAN / ED COURSE  As part of my medical decision making, I reviewed the following data within the electronic MEDICAL RECORD NUMBER  33 year old female presenting with above-stated history and physical exam following witnessed seizure activity.  Given known seizure history and the fact that the patient receive methotrexate yesterday suspect this to be the etiology of the patient's seizure.  Given the fact that this was medication induced with potential for further seizure activity patient discussed with Dr. Valentino Saxon OB/GYN who will consult on the patient.  Patient discussed with Dr. Allena Katz  hospitalist for hospital admission for observation. ____________________________________________  FINAL CLINICAL IMPRESSION(S) / ED DIAGNOSES  Final diagnoses:  Seizures (HCC)     MEDICATIONS GIVEN DURING THIS VISIT:  Medications - No data to display   ED Discharge Orders    None       Note:  This document was prepared using Dragon voice recognition software and may include unintentional dictation errors.    Darci Current, MD 09/13/18 [id number]

## 2018-09-14 ENCOUNTER — Other Ambulatory Visit: Payer: Self-pay

## 2018-09-14 ENCOUNTER — Other Ambulatory Visit: Payer: Self-pay | Admitting: Obstetrics and Gynecology

## 2018-09-14 DIAGNOSIS — R569 Unspecified convulsions: Secondary | ICD-10-CM | POA: Diagnosis not present

## 2018-09-14 DIAGNOSIS — O00101 Right tubal pregnancy without intrauterine pregnancy: Secondary | ICD-10-CM

## 2018-09-14 LAB — URINALYSIS, ROUTINE W REFLEX MICROSCOPIC
Bilirubin Urine: NEGATIVE
Glucose, UA: NEGATIVE mg/dL
Ketones, ur: NEGATIVE mg/dL
Leukocytes, UA: NEGATIVE
Nitrite: NEGATIVE
Protein, ur: 30 mg/dL — AB
RBC / HPF: >50 RBC/hpf — ABNORMAL HIGH (ref 0–5)
Specific Gravity, Urine: 1.018 (ref 1.005–1.030)
pH: 6 (ref 5.0–8.0)

## 2018-09-14 LAB — BASIC METABOLIC PANEL
Anion gap: 6 (ref 5–15)
BUN: 9 mg/dL (ref 6–20)
CO2: 24 mmol/L (ref 22–32)
Calcium: 8.5 mg/dL — ABNORMAL LOW (ref 8.9–10.3)
Chloride: 108 mmol/L (ref 98–111)
Creatinine, Ser: 0.78 mg/dL (ref 0.44–1.00)
GFR calc Af Amer: >60 mL/min (ref >60)
GFR calc non Af Amer: >60 mL/min (ref >60)
Glucose, Bld: 117 mg/dL — ABNORMAL HIGH (ref 70–99)
Potassium: 3.6 mmol/L (ref 3.5–5.1)
Sodium: 138 mmol/L (ref 135–145)

## 2018-09-14 LAB — CBC
HCT: 30.6 % — ABNORMAL LOW (ref 36.0–46.0)
Hemoglobin: 9.7 g/dL — ABNORMAL LOW (ref 12.0–15.0)
MCH: 27.2 pg (ref 26.0–34.0)
MCHC: 31.7 g/dL (ref 30.0–36.0)
MCV: 85.7 fL (ref 80.0–100.0)
Platelets: 283 10*3/uL (ref 150–400)
RBC: 3.57 MIL/uL — ABNORMAL LOW (ref 3.87–5.11)
RDW: 14 % (ref 11.5–15.5)
WBC: 7.7 10*3/uL (ref 4.0–10.5)
nRBC: 0 % (ref 0.0–0.2)

## 2018-09-14 LAB — GLUCOSE, CAPILLARY: Glucose-Capillary: 92 mg/dL (ref 70–99)

## 2018-09-14 MED ORDER — SODIUM CHLORIDE 0.9 % IV SOLN
1.0000 g | INTRAVENOUS | Status: DC
  Administered 2018-09-14: 1 g via INTRAVENOUS
  Filled 2018-09-14: qty 1
  Filled 2018-09-14: qty 10

## 2018-09-14 MED ORDER — LEVETIRACETAM 500 MG PO TABS
500.0000 mg | ORAL_TABLET | Freq: Two times a day (BID) | ORAL | Status: DC
  Administered 2018-09-14: 13:00:00 500 mg via ORAL
  Filled 2018-09-14 (×2): qty 1

## 2018-09-14 MED ORDER — SODIUM CHLORIDE 0.9% FLUSH: 3.0000 mL | Freq: Two times a day (BID) | INTRAVENOUS | Status: DC

## 2018-09-14 MED ORDER — ACETAMINOPHEN 650 MG RE SUPP: 650.0000 mg | Freq: Four times a day (QID) | RECTAL | Status: DC | PRN

## 2018-09-14 MED ORDER — LEVETIRACETAM 500 MG PO TABS: 500.0000 mg | ORAL_TABLET | Freq: Two times a day (BID) | ORAL | 1 refills | Status: DC

## 2018-09-14 MED ORDER — SODIUM CHLORIDE 0.9 % IV BOLUS
1000.0000 mL | Freq: Once | INTRAVENOUS | Status: AC
  Administered 2018-09-14: 1000 mL via INTRAVENOUS

## 2018-09-14 MED ORDER — ONDANSETRON HCL 4 MG PO TABS: 4.0000 mg | ORAL_TABLET | Freq: Four times a day (QID) | ORAL | Status: DC | PRN

## 2018-09-14 MED ORDER — SODIUM CHLORIDE 0.9 % IV SOLN
INTRAVENOUS | Status: DC
  Administered 2018-09-14: 04:00:00 via INTRAVENOUS

## 2018-09-14 MED ORDER — LORAZEPAM 2 MG/ML IJ SOLN
1.0000 mg | INTRAMUSCULAR | Status: DC | PRN
  Administered 2018-09-14: 1 mg via INTRAVENOUS
  Filled 2018-09-14: qty 1

## 2018-09-14 MED ORDER — TOPIRAMATE 100 MG PO TABS
100.0000 mg | ORAL_TABLET | Freq: Two times a day (BID) | ORAL | Status: DC
  Filled 2018-09-14: qty 1

## 2018-09-14 MED ORDER — ONDANSETRON HCL 4 MG/2ML IJ SOLN
4.0000 mg | Freq: Four times a day (QID) | INTRAMUSCULAR | Status: DC | PRN
  Administered 2018-09-14: 4 mg via INTRAVENOUS
  Filled 2018-09-14: qty 2

## 2018-09-14 MED ORDER — SENNOSIDES-DOCUSATE SODIUM 8.6-50 MG PO TABS
1.0000 | ORAL_TABLET | Freq: Two times a day (BID) | ORAL | Status: DC
  Administered 2018-09-14: 10:00:00 1 via ORAL
  Filled 2018-09-14: qty 1

## 2018-09-14 MED ORDER — TOPIRAMATE 25 MG PO TABS: 100.0000 mg | ORAL_TABLET | Freq: Two times a day (BID) | ORAL | Status: DC

## 2018-09-14 MED ORDER — BISACODYL 5 MG PO TBEC
5.0000 mg | DELAYED_RELEASE_TABLET | Freq: Every day | ORAL | Status: DC | PRN
  Administered 2018-09-14: 5 mg via ORAL
  Filled 2018-09-14: qty 1

## 2018-09-14 MED ORDER — DOCUSATE SODIUM 100 MG PO CAPS
100.0000 mg | ORAL_CAPSULE | Freq: Two times a day (BID) | ORAL | Status: DC
  Administered 2018-09-14: 10:00:00 100 mg via ORAL
  Filled 2018-09-14: qty 1

## 2018-09-14 MED ORDER — ACETAMINOPHEN 325 MG PO TABS: 650.0000 mg | ORAL_TABLET | Freq: Four times a day (QID) | ORAL | Status: DC | PRN

## 2018-09-14 MED ORDER — PROMETHAZINE HCL 25 MG/ML IJ SOLN
12.5000 mg | Freq: Four times a day (QID) | INTRAMUSCULAR | Status: DC | PRN
  Filled 2018-09-14: qty 1

## 2018-09-14 MED ORDER — OXYCODONE HCL 5 MG PO TABS
5.0000 mg | ORAL_TABLET | ORAL | Status: DC | PRN
  Administered 2018-09-14 (×2): 10 mg via ORAL
  Filled 2018-09-14 (×2): qty 2

## 2018-09-14 NOTE — ED Notes (Signed)
ED TO INPATIENT HANDOFF REPORT  Name/Age/Gender Jill [name] 33 y.o. female  Code Status Code Status History    Date Active Date Inactive Code Status Order ID Comments User Context   10/07/2016 0619 10/08/2016 0203 Full Code [id number]  Schermerhorn, Ihor Austin, MD Inpatient   10/07/2016 0550 10/07/2016 0619 Full Code [id number]  Angelia Mould, RN Inpatient   06/29/2016 1350 06/29/2016 2014 Full Code [id number]  Sharee Pimple, CNM Inpatient      Home/SNF/Other home  Chief Complaint Seizure  Level of Care/Admitting Diagnosis ED Disposition    ED Disposition Condition Comment   Admit  Hospital Area: [facility] [[id number]]  Level of Care: Med-Surg [16]  Diagnosis: Seizure [facility]) [205090]  Admitting Physician: Montez Morita [[id number]]  Attending Physician: Montez Morita [[id number]]  PT Class (Do Not Modify): Observation [104]  PT Acc Code (Do Not Modify): Observation [10022]       Medical History Past Medical History:  Diagnosis Date  . Anemia   . Headache   . Seizures (HCC)     Allergies No Known Allergies  IV Location/Drains/Wounds Patient Lines/Drains/Airways Status   Active Line/Drains/Airways    Name:   Placement date:   Placement time:   Site:   Days:   Peripheral IV 09/13/18 Left Antecubital   09/13/18    2217    Antecubital   1   Incision (Closed) 10/07/16 Vagina   10/07/16    0639     707   Incision (Closed) 10/07/16 Abdomen   10/07/16    0703     707          Labs/Imaging Results for orders placed or performed during the hospital encounter of 09/13/18 (from the past 48 hour(s))  Comprehensive metabolic panel     Status: Abnormal   Collection Time: 09/13/18 10:26 PM  Result Value Ref Range   Sodium 137 135 - 145 mmol/L   Potassium 3.6 3.5 - 5.1 mmol/L   Chloride 107 98 - 111 mmol/L   CO2 23 22 - 32 mmol/L   Glucose, Bld 104 (H) 70 - 99 mg/dL   BUN 11 6 - 20 mg/dL   Creatinine, Ser 0.09 0.44 - 1.00 mg/dL   Calcium 9.1 8.9  - 23.3 mg/dL   Total Protein 7.3 6.5 - 8.1 g/dL   Albumin 4.1 3.5 - 5.0 g/dL   AST 16 15 - 41 U/L   ALT 12 0 - 44 U/L   Alkaline Phosphatase 69 38 - 126 U/L   Total Bilirubin 0.4 0.3 - 1.2 mg/dL   GFR calc non Af Amer >60 >60 mL/min   GFR calc Af Amer >60 >60 mL/min   Anion gap 7 5 - 15    Comment: Performed at [facility], [address] [place], [place] [address]  CBC     Status: Abnormal   Collection Time: 09/13/18 10:26 PM  Result Value Ref Range   WBC 8.0 4.0 - 10.5 K/uL   RBC 4.03 3.87 - 5.11 MIL/uL   Hemoglobin 10.7 (L) 12.0 - 15.0 g/dL   HCT 26.3 (L) 33.5 - 45.6 %   MCV 85.1 80.0 - 100.0 fL   MCH 26.6 26.0 - 34.0 pg   MCHC 31.2 30.0 - 36.0 g/dL   RDW 25.6 38.9 - 37.3 %   Platelets 315 150 - 400 K/uL   nRBC 0.0 0.0 - 0.2 %    Comment: Performed at [facility], [address] [place],  [place] [id number]   [place] Ob Comp Less 14 Wks  Result Date: 09/12/2018 CLINICAL DATA:  Pelvic pain for 1 day. The patient has a history of ectopic pregnancy in August of 2019. EXAM: OBSTETRIC <14 WK US AND TRANSVAGINAL OB US TECHNIQUE: Both transabdominal and transvaginal ultrasound examinations were performed for complete evaluation of the gestation as well as the maternal uterus, adnexal regions, and pelvic cul-de-sac. Transvaginal technique was performed to assess early pregnancy. COMPARISON:  None. FINDINGS: Intrauterine gestational sac: None Yolk sac:  Not Visualized. Embryo:  Not Visualized. Cardiac Activity: Not Visualized. Heart Rate: Does not apply bpm MSD:   mm    w     d CRL:    mm    w    d                  US EDC: Subchorionic hemorrhage:  None visualized. Maternal uterus/adnexae: In the left adnexa, there is a 5.7 mm cystic area. An ectopic pregnancy is not excluded. The right ovary is normal. IMPRESSION: No intrauterine gestational sac is identified. In the left adnexa, there is a 5.7 mm cystic area, an ectopic pregnancy is not excluded. Electronically Signed   By:  Sherian ReinWei-Chen  Lin M.D.   On: 09/12/2018 15:40   [place] Ob Transvaginal  Result Date: 09/12/2018 CLINICAL DATA:  Pelvic pain for 1 day. The patient has a history of ectopic pregnancy in August of 2019. EXAM: OBSTETRIC <14 WK US AND TRANSVAGINAL OB US TECHNIQUE: Both transabdominal and transvaginal ultrasound examinations were performed for complete evaluation of the gestation as well as the maternal uterus, adnexal regions, and pelvic cul-de-sac. Transvaginal technique was performed to assess early pregnancy. COMPARISON:  None. FINDINGS: Intrauterine gestational sac: None Yolk sac:  Not Visualized. Embryo:  Not Visualized. Cardiac Activity: Not Visualized. Heart Rate: Does not apply bpm MSD:   mm    w     d CRL:    mm    w    d                  US EDC: Subchorionic hemorrhage:  None visualized. Maternal uterus/adnexae: In the left adnexa, there is a 5.7 mm cystic area. An ectopic pregnancy is not excluded. The right ovary is normal. IMPRESSION: No intrauterine gestational sac is identified. In the left adnexa, there is a 5.7 mm cystic area, an ectopic pregnancy is not excluded. Electronically Signed   By: Sherian ReinWei-Chen  Lin M.D.   On: 09/12/2018 15:40    Pending Labs Unresulted Labs (From admission, onward)    Start     Ordered   09/14/18 0116  Urinalysis, Routine w reflex microscopic  Once,   STAT     09/14/18 0116   09/14/18 0116  Urine Culture  Once,   STAT     09/14/18 0116   09/13/18 2228  Topiramate level  Once,   STAT     09/13/18 2228   Signed and Held  HIV antibody (Routine Testing)  Once,   R     Signed and Held   Signed and Held  Basic metabolic panel  Tomorrow morning,   R     Signed and Held   Signed and Held  CBC  Tomorrow morning,   R     Signed and Held          Vitals/Pain Today's Vitals   09/13/18 2229 09/13/18 2330 09/14/18 0000 09/14/18 0100  BP:  119/86 127/84 126/89  Pulse:  97 (!)  109 (!) 107  Resp:  14 (!) 26 14  Temp:      TempSrc:      SpO2:  100% 100% 100%  Weight:       Height:      PainSc: 10-Worst pain ever       Isolation Precautions No active isolations  Medications Medications  sodium chloride 0.9 % bolus 1,000 mL (1,000 mLs Intravenous New Bag/Given 09/14/18 0102)  topiramate (TOPAMAX) tablet 100 mg (has no administration in time range)  cefTRIAXone (ROCEPHIN) 1 g in sodium chloride 0.9 % 100 mL IVPB (has no administration in time range)    Mobility independent

## 2018-09-14 NOTE — Discharge Summary (Signed)
[facility] [place] [facility]   PATIENT NAME: Jill [name]    MR#:  [id number]  DATE OF BIRTH:  06/30/1986  DATE OF ADMISSION:  09/13/2018 ADMITTING PHYSICIAN: Montez Morita, MD  DATE OF DISCHARGE: 09/14/2018  PRIMARY CARE PHYSICIAN: [facility]    ADMISSION DIAGNOSIS:  Seizures (HCC) [R56.9]  DISCHARGE DIAGNOSIS:  Active Problems:   Seizure (HCC)   SECONDARY DIAGNOSIS:   Past Medical History:  Diagnosis Date  . Anemia   . Headache   . Seizures [facility])     HOSPITAL COURSE:   33 year old female with past medical history of epilepsy, chronic anemia, headaches who presented to the hospital due to a seizure.  1.  Seizures-patient has previous history of epilepsy and is currently on Topamax and presented with a breakthrough seizure.  Patient was recently diagnosed with an ectopic pregnancy and given methotrexate, and then presented with a breakthrough seizure. - Patient was given some IV Ativan for her seizure and since then has had no further activity.  Seen by neurology and they recommended switching patient's antiepileptics from Topamax to Keppra.  Patient was started on Keppra 500 mg twice daily and is presently being discharged on that.  She will follow-up with outpatient neurology.  2.  Ectopic pregnancy- recently received methotrexate. -Still has some vague lower abdominal pain, transvaginal ultrasound showing a adnexal cyst.  Continue follow-up with OB/GYN as an outpatient.  Stable to be discharged home today.   DISCHARGE CONDITIONS:   Stable  CONSULTS OBTAINED:  Treatment Team:  Kym Groom, MD  DRUG ALLERGIES:  No Known Allergies  DISCHARGE MEDICATIONS:   Allergies as of 09/14/2018   No Known Allergies     Medication List    STOP taking these medications   acetaminophen 650 MG CR tablet Commonly known as:  TYLENOL 8 HOUR   naproxen 500 MG tablet Commonly known as:  NAPROSYN   topiramate 50 MG  tablet Commonly known as:  TOPAMAX   traMADol 50 MG tablet Commonly known as:  ULTRAM     TAKE these medications   levETIRAcetam 500 MG tablet Commonly known as:  KEPPRA Take 1 tablet (500 mg total) by mouth 2 (two) times daily.         DISCHARGE INSTRUCTIONS:   DIET:  Regular diet  DISCHARGE CONDITION:  Stable  ACTIVITY:  Activity as tolerated  OXYGEN:  Home Oxygen: No.   Oxygen Delivery: room air  DISCHARGE LOCATION:  home   If you experience worsening of your admission symptoms, develop shortness of breath, life threatening emergency, suicidal or homicidal thoughts you must seek medical attention immediately by calling 911 or calling your MD immediately  if symptoms less severe.  You Must read complete instructions/literature along with all the possible adverse reactions/side effects for all the Medicines you take and that have been prescribed to you. Take any new Medicines after you have completely understood and accpet all the possible adverse reactions/side effects.   Please note  You were cared for by a hospitalist during your hospital stay. If you have any questions about your discharge medications or the care you received while you were in the hospital after you are discharged, you can call the unit and asked to speak with the hospitalist on call if the hospitalist that took care of you is not available. Once you are discharged, your primary care physician will handle any further medical issues. Please note that NO REFILLS for any discharge medications will be  authorized once you are discharged, as it is imperative that you return to your primary care physician (or establish a relationship with a primary care physician if you do not have one) for your aftercare needs so that they can reassess your need for medications and monitor your lab values.     Today   No acute events overnight. No further Seizure type activity.  Discussed with Neurology and plan to  discharge home today on new anti-epileptics.   VITAL SIGNS:  Blood pressure 123/84, pulse (!) 120, temperature 98.1 F (36.7 C), temperature source Oral, resp. rate 18, height 5\' 3"  (1.6 m), weight 91.6 kg, last menstrual period 09/02/2018, SpO2 99 %.  I/O:    Intake/Output Summary (Last 24 hours) at 09/14/2018 1422 Last data filed at 09/14/2018 0600 Gross per 24 hour  Intake -  Output 400 ml  Net -400 ml    PHYSICAL EXAMINATION:  GENERAL:  33 y.o.-year-old patient lying in the bed in no acute distress.  EYES: Pupils equal, round, reactive to light and accommodation. No scleral icterus. Extraocular muscles intact.  HEENT: Head atraumatic, normocephalic. Oropharynx and nasopharynx clear.  NECK:  Supple, no jugular venous distention. No thyroid enlargement, no tenderness.  LUNGS: Normal breath sounds bilaterally, no wheezing, rales,rhonchi. No use of accessory muscles of respiration.  CARDIOVASCULAR: S1, S2 normal. No murmurs, rubs, or gallops.  ABDOMEN: Soft, tender in lower abdomen, no rebound, rigidity, non-distended. Bowel sounds present. No organomegaly or mass.  EXTREMITIES: No pedal edema, cyanosis, or clubbing.  NEUROLOGIC: Cranial nerves II through XII are intact. No focal motor or sensory defecits b/l.  PSYCHIATRIC: The patient is alert and oriented x 3.  SKIN: No obvious rash, lesion, or ulcer.   DATA REVIEW:   CBC Recent Labs  Lab 09/14/18 0423  WBC 7.7  HGB 9.7*  HCT 30.6*  PLT 283    Chemistries  Recent Labs  Lab 09/13/18 2226 09/14/18 0423  NA 137 138  K 3.6 3.6  CL 107 108  CO2 23 24  GLUCOSE 104* 117*  BUN 11 9  CREATININE 0.90 0.78  CALCIUM 9.1 8.5*  AST 16  --   ALT 12  --   ALKPHOS 69  --   BILITOT 0.4  --     Cardiac Enzymes No results for input(s): TROPONINI in the last 168 hours.   RADIOLOGY:  US Ob Comp Less 14 Wks  Result Date: 09/12/2018 CLINICAL DATA:  Pelvic pain for 1 day. The patient has a history of ectopic pregnancy in  August of 2019. EXAM: OBSTETRIC <14 WK [place] AND TRANSVAGINAL OB US TECHNIQUE: Both transabdominal and transvaginal ultrasound examinations were performed for complete evaluation of the gestation as well as the maternal uterus, adnexal regions, and pelvic cul-de-sac. Transvaginal technique was performed to assess early pregnancy. COMPARISON:  None. FINDINGS: Intrauterine gestational sac: None Yolk sac:  Not Visualized. Embryo:  Not Visualized. Cardiac Activity: Not Visualized. Heart Rate: Does not apply bpm MSD:   mm    w     d CRL:    mm    w    d                  [place] EDC: Subchorionic hemorrhage:  None visualized. Maternal uterus/adnexae: In the left adnexa, there is a 5.7 mm cystic area. An ectopic pregnancy is not excluded. The right ovary is normal. IMPRESSION: No intrauterine gestational sac is identified. In the left adnexa, there is a 5.7 mm cystic area,  an ectopic pregnancy is not excluded. Electronically Signed   By: Sherian ReinWei-Chen  Lin M.D.   On: 09/12/2018 15:40   [place] Ob Transvaginal  Result Date: 09/12/2018 CLINICAL DATA:  Pelvic pain for 1 day. The patient has a history of ectopic pregnancy in August of 2019. EXAM: OBSTETRIC <14 WK US AND TRANSVAGINAL OB US TECHNIQUE: Both transabdominal and transvaginal ultrasound examinations were performed for complete evaluation of the gestation as well as the maternal uterus, adnexal regions, and pelvic cul-de-sac. Transvaginal technique was performed to assess early pregnancy. COMPARISON:  None. FINDINGS: Intrauterine gestational sac: None Yolk sac:  Not Visualized. Embryo:  Not Visualized. Cardiac Activity: Not Visualized. Heart Rate: Does not apply bpm MSD:   mm    w     d CRL:    mm    w    d                  US EDC: Subchorionic hemorrhage:  None visualized. Maternal uterus/adnexae: In the left adnexa, there is a 5.7 mm cystic area. An ectopic pregnancy is not excluded. The right ovary is normal. IMPRESSION: No intrauterine gestational sac is identified. In the  left adnexa, there is a 5.7 mm cystic area, an ectopic pregnancy is not excluded. Electronically Signed   By: Sherian ReinWei-Chen  Lin M.D.   On: 09/12/2018 15:40      Management plans discussed with the patient, family and they are in agreement.  CODE STATUS:     Code Status Orders  (From admission, onward)         Start     Ordered   09/14/18 0148  Full code  Continuous     09/14/18 0147        TOTAL TIME TAKING CARE OF THIS PATIENT: 40 minutes.    [place] SirenSAINANI,Ayah Cozzolino J M.D on 09/14/2018 at 2:22 PM  Between 7am to 6pm - Pager - [phone]  After 6pm go to [email or web address] - Therapist, nutritionalpassword EPAS [email or web address]  [facility] [place] [facility]  Office  [phone]  CC: Primary care physician; [facility]

## 2018-09-14 NOTE — H&P (Signed)
[facility] - [place] [facility]   PATIENT NAME: Jill [name]    MR#:  [id number]  DATE OF BIRTH:  1986-02-25  DATE OF ADMISSION:  09/13/2018  PRIMARY CARE PHYSICIAN: [facility]   REQUESTING/REFERRING PHYSICIAN: Dr.Brown   CHIEF COMPLAINT:   Chief Complaint  Patient presents with  . Seizures    HISTORY OF PRESENT ILLNESS:  Jill [name]  is a 33 y.o. female with a known history listed below presented to emergency room for evaluation of seizure at home.  Patient was seen in emergency room yesterday for ectopic pregnancy.  Patient was given methotrexate and discharged home.  Today patient was feeling tired and fatigued.  Patient had generalized tonic-clonic seizure activity as per mother.  In the emergency room patient had no further episodes of seizure.  Patient is alert awake oriented and answering questions.  ER physician has discussed case with OB/GYN who will consult on patient.  Hospitalist team requested for admission.  Currently patient has no symptoms other than generalized weakness.  Patient denies any vaginal discharge or bleeding.  Patient complaining of urinary frequency.  Also patient has no bowel movement for last 5 to 6 days.  Denies abdominal pain or nausea or vomiting.  PAST MEDICAL HISTORY:   Past Medical History:  Diagnosis Date  . Anemia   . Headache   . Seizures (HCC)     PAST SURGICAL HISTORY:   Past Surgical History:  Procedure Laterality Date  . CESAREAN SECTION     x4  . CESAREAN SECTION N/A 10/07/2016   Procedure: CESAREAN SECTION;  Surgeon: Suzy Bouchardhomas J Schermerhorn, MD;  Location: [facility] ORS;  Service: Obstetrics;  Laterality: N/A;    SOCIAL HISTORY:   Social History   Tobacco Use  . Smoking status: Former Games developermoker  . Smokeless tobacco: Never Used  Substance Use Topics  . Alcohol use: No    FAMILY HISTORY:   Family History  Problem Relation Age of Onset  . Healthy Mother   . Healthy  Father   . Cancer Neg Hx   . Thyroid disease Neg Hx   . Diabetes Neg Hx     DRUG ALLERGIES:  No Known Allergies  REVIEW OF SYSTEMS:   ROS-12 point review of system reviewed positive as per HPI otherwise negative.  MEDICATIONS AT HOME:   Prior to Admission medications   Medication Sig Start Date End Date Taking? Authorizing Provider  topiramate (TOPAMAX) 50 MG tablet Take 100 mg by mouth 2 (two) times daily.   Yes [provider]  acetaminophen (TYLENOL 8 HOUR) 650 MG CR tablet Take 1 tablet (650 mg total) by mouth every 8 (eight) hours as needed for pain (mild pain). Patient not taking: Reported on 09/14/2018 09/12/18   Hildred Laserherry, Anika, MD  naproxen (NAPROSYN) 500 MG tablet Take 1 tablet (500 mg total) by mouth every 12 (twelve) hours as needed. No more than two days per week Patient not taking: Reported on 09/14/2018 05/24/17   Drema DallasJaffe, Adam R, DO  topiramate (TOPAMAX) 50 MG tablet Take 1/2 tab BID x 7 days, then 1 tab BID x 7 days, then 1.5 tab BID x 7 days, then 2 tabs BID Patient not taking: Reported on 09/14/2018 04/05/17   Drema DallasJaffe, Adam R, DO  traMADol (ULTRAM) 50 MG tablet Take 1 tablet (50 mg total) by mouth every 6 (six) hours as needed for moderate pain (may take 2 tabs q 6 hr for severe pain). Patient not taking: Reported  on 09/14/2018 09/12/18 09/12/19  Hildred Laser, MD      VITAL SIGNS:  Blood pressure 126/89, pulse (!) 107, temperature 98.2 F (36.8 C), temperature source Oral, resp. rate 14, height 5\' 3"  (1.6 m), weight 91.6 kg, last menstrual period 09/02/2018, SpO2 100 %.  PHYSICAL EXAMINATION:  Physical Exam  GENERAL:  33 y.o.-year-old patient lying in the bed with no acute distress.  EYES: Pupils equal, round, reactive to light and accommodation. No scleral icterus. Extraocular muscles intact.  HEENT: Head atraumatic, normocephalic. Oropharynx and nasopharynx clear.  NECK:  Supple, no jugular venous distention. No thyroid enlargement, no tenderness.  LUNGS: Normal  breath sounds bilaterally, no wheezing, rales,rhonchi or crepitation. No use of accessory muscles of respiration.  CARDIOVASCULAR: S1, S2 normal. No murmurs, rubs, or gallops.  ABDOMEN: Soft, nontender, nondistended. Bowel sounds present. No organomegaly or mass.  Umbilical hernia. EXTREMITIES: No pedal edema, cyanosis, or clubbing.  NEUROLOGIC: Cranial nerves II through XII are intact. Muscle strength 5/5 in all extremities. Sensation intact. Gait not checked.  PSYCHIATRIC: The patient is alert and oriented x 3.  SKIN: No obvious rash, lesion, or ulcer.   LABORATORY PANEL:   CBC Recent Labs  Lab 09/13/18 2226  WBC 8.0  HGB 10.7*  HCT 34.3*  PLT 315   ------------------------------------------------------------------------------------------------------------------  Chemistries  Recent Labs  Lab 09/13/18 2226  NA 137  K 3.6  CL 107  CO2 23  GLUCOSE 104*  BUN 11  CREATININE 0.90  CALCIUM 9.1  AST 16  ALT 12  ALKPHOS 69  BILITOT 0.4   ------------------------------------------------------------------------------------------------------------------  Cardiac Enzymes No results for input(s): TROPONINI in the last 168 hours. ------------------------------------------------------------------------------------------------------------------  RADIOLOGY:  US Ob Comp Less 14 Wks  Result Date: 09/12/2018 CLINICAL DATA:  Pelvic pain for 1 day. The patient has a history of ectopic pregnancy in August of 2019. EXAM: OBSTETRIC <14 WK [place] AND TRANSVAGINAL OB US TECHNIQUE: Both transabdominal and transvaginal ultrasound examinations were performed for complete evaluation of the gestation as well as the maternal uterus, adnexal regions, and pelvic cul-de-sac. Transvaginal technique was performed to assess early pregnancy. COMPARISON:  None. FINDINGS: Intrauterine gestational sac: None Yolk sac:  Not Visualized. Embryo:  Not Visualized. Cardiac Activity: Not Visualized. Heart Rate: Does not  apply bpm MSD:   mm    w     d CRL:    mm    w    d                  [place] EDC: Subchorionic hemorrhage:  None visualized. Maternal uterus/adnexae: In the left adnexa, there is a 5.7 mm cystic area. An ectopic pregnancy is not excluded. The right ovary is normal. IMPRESSION: No intrauterine gestational sac is identified. In the left adnexa, there is a 5.7 mm cystic area, an ectopic pregnancy is not excluded. Electronically Signed   By: Sherian Rein M.D.   On: 09/12/2018 15:40   US Ob Transvaginal  Result Date: 09/12/2018 CLINICAL DATA:  Pelvic pain for 1 day. The patient has a history of ectopic pregnancy in August of 2019. EXAM: OBSTETRIC <14 WK [place] AND TRANSVAGINAL OB US TECHNIQUE: Both transabdominal and transvaginal ultrasound examinations were performed for complete evaluation of the gestation as well as the maternal uterus, adnexal regions, and pelvic cul-de-sac. Transvaginal technique was performed to assess early pregnancy. COMPARISON:  None. FINDINGS: Intrauterine gestational sac: None Yolk sac:  Not Visualized. Embryo:  Not Visualized. Cardiac Activity: Not Visualized. Heart Rate: Does not apply bpm  MSD:   mm    w     d CRL:    mm    w    d                  [place] EDC: Subchorionic hemorrhage:  None visualized. Maternal uterus/adnexae: In the left adnexa, there is a 5.7 mm cystic area. An ectopic pregnancy is not excluded. The right ovary is normal. IMPRESSION: No intrauterine gestational sac is identified. In the left adnexa, there is a 5.7 mm cystic area, an ectopic pregnancy is not excluded. Electronically Signed   By: Sherian Rein M.D.   On: 09/12/2018 15:40      IMPRESSION AND PLAN:   1.  Breakthrough seizure in a patient with seizure disorder: Could be related to methotrexate use for ectopic pregnancy.  Possibly related to UTI as well.  Continue Topamax.  Patient has some side effects with Topamax daily.  Patient and family wants neurology to evaluate and change medication if possible.   Neurology consult requested.  Follow-up recommendation.  2.  Recent ectopic pregnancy: OB/GYN consult requested.  Follow-up recommendation.  3.  UTI based on yesterday's urine analysis.: Patient started on IV Rocephin.  Follow-up urine analysis and culture.  4.  Chronic other medical problems: Monitor  DVT prophylaxis: SCD  Estimated length of stay less than 2 midnight  Moderate to high risk secondary to above  Further treatment based on clinical course and specialist evaluation.  Case discussed with patient and family in detail who is in agreement.    All the records are reviewed and case discussed with ED provider. Management plans discussed with the patient, family and they are in agreement.  CODE STATUS: Full  TOTAL TIME TAKING CARE OF THIS PATIENT: 30 minutes.    Montez Morita M.D on 09/14/2018 at 1:31 AM  Between 7am to 6pm - Pager - [phone]  After 6pm go to [email or web address] - Therapist, nutritional Hospitalists  Office  [phone]  CC: Primary care physician; [facility]

## 2018-09-14 NOTE — Consult Note (Signed)
Reason for Consult: Seizure disorder Referring Physician: Hilda Lias  CC: Seizure   HPI: Jill [name] is an 33 y.o. female past medical history of seizure disorder, anemia, chronic daily headaches and recent ectopic pregnancy presenting to the ED on 09/13/2018 with witnessed seizure-like activity.  On arrival to the ED she was noted to be postictal with no reports of loss of bladder or bowel control, or tongue laceration.  Patient was recently seen in the ED on 09/12/2018 due to complaints of bleeding post laparoscopy site with fullness and discomfort in her abdomen and back.  Pregnancy test in the emergency room was noted to be positive, with no visible IUP noted on ultrasound, along with right adnexal mass concerning for ectopic pregnancy. She was started on methotrexate in the ER and discharged on Ultram and Tylenol. Patient and husband report that she is currently on Topiramate for seizure disorder but has not been taking consistently due to side effects. Her last follow up with her neurologist Dr. 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  Initial labs in the ED yesterday was unremarkable.  Patient was admitted for further evaluation and monitoring. She continues to complain of nausea and intermittent headaches.  Past Medical History:  Diagnosis Date  . Anemia   . Headache   . Seizures (HCC)     Past Surgical History:  Procedure Laterality Date  . CESAREAN SECTION     x4  . CESAREAN SECTION N/A 10/07/2016   Procedure: CESAREAN SECTION;  Surgeon: Suzy Bouchard, MD;  Location: [facility] ORS;  Service: Obstetrics;  Laterality: N/A;    Family History  Problem Relation Age of Onset  . Healthy Mother   . Healthy Father   . Cancer Neg Hx    . Thyroid disease Neg Hx   . Diabetes Neg Hx     Social History:  reports that she has quit smoking. She has never used smokeless tobacco. She reports that she does not drink alcohol or use drugs.  No Known Allergies  Medications:  I have reviewed the patient's current medications. Prior to Admission:  Medications Prior to Admission  Medication Sig Dispense Refill Last Dose  . topiramate (TOPAMAX) 50 MG tablet Take 100 mg by mouth 2 (two) times daily.   Past Week at Unknown time  . acetaminophen (TYLENOL 8 HOUR) 650 MG CR tablet Take 1 tablet (650 mg total) by mouth every 8 (eight) hours as needed for pain (mild pain). (Patient not taking: Reported on 09/14/2018) 30 tablet 1 Not Taking at Unknown time  . naproxen (NAPROSYN) 500 MG tablet Take 1 tablet (500 mg total) by mouth every 12 (twelve) hours as needed. No more than two days per week (Patient not taking: Reported on 09/14/2018) 16 tablet 2 Not Taking  . topiramate (TOPAMAX) 50 MG tablet Take 1/2 tab BID x 7 days, then 1 tab BID x 7 days, then 1.5 tab BID x 7 days, then 2 tabs BID (Patient not taking: Reported on 09/14/2018) 120 tablet 0 Not Taking at Unknown time  . traMADol (ULTRAM) 50 MG tablet Take 1 tablet (50 mg total) by mouth every 6 (six) hours as needed for moderate pain (may take 2 tabs q 6 hr for severe pain). (Patient not taking: Reported on 09/14/2018) 20 tablet  0 Not Taking at Unknown time   Scheduled: . docusate sodium  100 mg Oral BID  . levETIRAcetam  500 mg Oral BID  . senna-docusate  1 tablet Oral BID  . sodium chloride flush  3 mL Intravenous Q12H    ROS: History obtained from the patient   General ROS: negative for - chills, fatigue, fever, night sweats, weight gain or weight loss Psychological ROS: negative for - behavioral disorder, hallucinations, memory difficulties, mood swings or suicidal ideation Ophthalmic ROS: negative for - blurry vision, double vision, eye pain or loss of vision ENT ROS: negative for -  epistaxis, nasal discharge, oral lesions, sore throat, tinnitus or vertigo Allergy and Immunology ROS: negative for - hives or itchy/watery eyes Hematological and Lymphatic ROS: negative for - bleeding problems, bruising or swollen lymph nodes Endocrine ROS: negative for - galactorrhea, hair pattern changes, polydipsia/polyuria or temperature intolerance Respiratory ROS: negative for - cough, hemoptysis, shortness of breath or wheezing Cardiovascular ROS: negative for - chest pain, dyspnea on exertion, edema or irregular heartbeat Gastrointestinal ROS: negative for - abdominal pain, diarrhea, hematemesis, nausea/vomiting or stool incontinence Genito-Urinary ROS: negative for - dysuria, hematuria, incontinence or urinary frequency/urgency Musculoskeletal ROS: negative for - joint swelling or muscular weakness Neurological ROS: as noted in HPI Dermatological ROS: negative for rash and skin lesion changes  Physical Examination: Blood pressure 123/84, pulse (!) 120, temperature 98.1 F (36.7 C), temperature source Oral, resp. rate 18, height 5\' 3"  (1.6 m), weight 91.6 kg, last menstrual period 09/02/2018, SpO2 99 %.  HEENT-  Normocephalic, no lesions, without obvious abnormality.  Normal external eye and conjunctiva.  Normal TM's bilaterally.  Normal auditory canals and external ears. Normal external nose, mucus membranes and septum.  Normal pharynx. Cardiovascular- S1, S2 normal, pulses palpable throughout   Lungs- chest clear, no wheezing, rales, normal symmetric air entry Abdomen- soft, non-tender; bowel sounds normal; no masses,  no organomegaly Extremities- no edema Lymph-no adenopathy palpable Musculoskeletal-no joint tenderness, deformity or swelling Skin-warm and dry, no hyperpigmentation, vitiligo, or suspicious lesions  Neurological Exam   Mental Status: Alert, oriented, thought content appropriate.  Speech fluent without evidence of aphasia.  Able to follow 3 step commands without  difficulty. Attention span and concentration seemed appropriate  Cranial Nerves: II: Discs flat bilaterally; Visual fields grossly normal, pupils equal, round, reactive to light and accommodation III,IV, VI: ptosis not present, extra-ocular motions intact bilaterally V,VII: smile symmetric, facial light touch sensation intact VIII: hearing normal bilaterally IX,X: gag reflex present XI: bilateral shoulder shrug XII: midline tongue extension Motor: Right :  Upper extremity   5/5 Without pronator drift      Left: Upper extremity   5/5 without pronator drift Right:   Lower extremity   5/5                                          Left: Lower extremity   5/5 Tone and bulk:normal tone throughout; no atrophy noted Sensory: Pinprick and light touch intact bilaterally Deep Tendon Reflexes: 2+ and symmetric throughout Plantars: Right: mute                              Left: mute Cerebellar: Finger-to-nose testing intact bilaterally. Heel to shin testing normal bilaterally Gait: not tested due to safety concerns  Data Reviewed  Laboratory Studies:  Basic Metabolic Panel: Recent Labs  Lab 09/12/18 0927 09/13/18 2226 09/14/18 0423  NA 139 137 138  K 3.9 3.6 3.6  CL 107 107 108  CO2 25 23 24   GLUCOSE 97 104* 117*  BUN 10 11 9   CREATININE 0.95 0.90 0.78  CALCIUM 9.1 9.1 8.5*    Liver Function Tests: Recent Labs  Lab 09/12/18 0927 09/13/18 2226  AST 15 16  ALT 11 12  ALKPHOS 67 69  BILITOT 0.7 0.4  PROT 7.5 7.3  ALBUMIN 4.1 4.1   Recent Labs  Lab 09/12/18 0927  LIPASE 29   No results for input(s): AMMONIA in the last 168 hours.  CBC: Recent Labs  Lab 09/12/18 0927 09/13/18 2226 09/14/18 0423  WBC 7.0 8.0 7.7  HGB 11.0* 10.7* 9.7*  HCT 35.3* 34.3* 30.6*  MCV 87.2 85.1 85.7  PLT 284 315 283    Cardiac Enzymes: No results for input(s): CKTOTAL, CKMB, CKMBINDEX, TROPONINI in the last 168 hours.  BNP: Invalid input(s): POCBNP  CBG: Recent Labs  Lab  09/14/18 0730  GLUCAP 92    Microbiology: Results for orders placed or performed during the hospital encounter of 06/30/16  Chlamydia/NGC rt PCR ([facility] only)     Status: None   Collection Time: 06/30/16  3:22 PM  Result Value Ref Range Status   Specimen source GC/Chlam URINE, RANDOM  Final   Chlamydia Tr NOT DETECTED NOT DETECTED Final   N gonorrhoeae NOT DETECTED NOT DETECTED Final    Comment: (NOTE) 100  This methodology has not been evaluated in pregnant women or in 200  patients with a history of hysterectomy. 300 400  This methodology will not be performed on patients less than 3114  years of age.   Wet prep, genital     Status: Abnormal   Collection Time: 06/30/16  6:36 PM  Result Value Ref Range Status   Yeast Wet Prep HPF POC NONE SEEN NONE SEEN Final   Trich, Wet Prep NONE SEEN NONE SEEN Final   Clue Cells Wet Prep HPF POC PRESENT (A) NONE SEEN Final   WBC, Wet Prep HPF POC MODERATE (A) NONE SEEN Final   Sperm NONE SEEN  Final    Coagulation Studies: No results for input(s): LABPROT, INR in the last 72 hours.  Urinalysis:  Recent Labs  Lab 09/12/18 0928 09/14/18 0724  COLORURINE YELLOW* YELLOW*  LABSPEC 1.021 1.018  PHURINE 5.0 6.0  GLUCOSEU NEGATIVE NEGATIVE  HGBUR SMALL* LARGE*  BILIRUBINUR NEGATIVE NEGATIVE  KETONESUR NEGATIVE NEGATIVE  PROTEINUR 30* 30*  NITRITE POSITIVE* NEGATIVE  LEUKOCYTESUR NEGATIVE NEGATIVE    Lipid Panel:  No results found for: CHOL, TRIG, HDL, CHOLHDL, VLDL, LDLCALC  HgbA1C: No results found for: HGBA1C  Urine Drug Screen:      Component Value Date/Time   LABOPIA NONE DETECTED 06/30/2016 1522   COCAINSCRNUR NONE DETECTED 06/30/2016 1522   LABBENZ NONE DETECTED 06/30/2016 1522   AMPHETMU NONE DETECTED 06/30/2016 1522   THCU NONE DETECTED 06/30/2016 1522   LABBARB NONE DETECTED 06/30/2016 1522    Alcohol Level: No results for input(s): ETH in the last 168 hours.  Other results: EKG: normal EKG, normal sinus rhythm,  unchanged from previous tracings.  Imaging: [place] Ob Comp Less 14 Wks  Result Date: 09/12/2018 CLINICAL DATA:  Pelvic pain for 1 day. The patient has a history of ectopic pregnancy in August of 2019. EXAM: OBSTETRIC <14 WK US AND TRANSVAGINAL OB US TECHNIQUE: Both transabdominal and transvaginal ultrasound examinations were  performed for complete evaluation of the gestation as well as the maternal uterus, adnexal regions, and pelvic cul-de-sac. Transvaginal technique was performed to assess early pregnancy. COMPARISON:  None. FINDINGS: Intrauterine gestational sac: None Yolk sac:  Not Visualized. Embryo:  Not Visualized. Cardiac Activity: Not Visualized. Heart Rate: Does not apply bpm MSD:   mm    w     d CRL:    mm    w    d                  US EDC: Subchorionic hemorrhage:  None visualized. Maternal uterus/adnexae: In the left adnexa, there is a 5.7 mm cystic area. An ectopic pregnancy is not excluded. The right ovary is normal. IMPRESSION: No intrauterine gestational sac is identified. In the left adnexa, there is a 5.7 mm cystic area, an ectopic pregnancy is not excluded. Electronically Signed   By: Sherian ReinWei-Chen  Lin M.D.   On: 09/12/2018 15:40   [place] Ob Transvaginal  Result Date: 09/12/2018 CLINICAL DATA:  Pelvic pain for 1 day. The patient has a history of ectopic pregnancy in August of 2019. EXAM: OBSTETRIC <14 WK US AND TRANSVAGINAL OB US TECHNIQUE: Both transabdominal and transvaginal ultrasound examinations were performed for complete evaluation of the gestation as well as the maternal uterus, adnexal regions, and pelvic cul-de-sac. Transvaginal technique was performed to assess early pregnancy. COMPARISON:  None. FINDINGS: Intrauterine gestational sac: None Yolk sac:  Not Visualized. Embryo:  Not Visualized. Cardiac Activity: Not Visualized. Heart Rate: Does not apply bpm MSD:   mm    w     d CRL:    mm    w    d                  US EDC: Subchorionic hemorrhage:  None visualized. Maternal  uterus/adnexae: In the left adnexa, there is a 5.7 mm cystic area. An ectopic pregnancy is not excluded. The right ovary is normal. IMPRESSION: No intrauterine gestational sac is identified. In the left adnexa, there is a 5.7 mm cystic area, an ectopic pregnancy is not excluded. Electronically Signed   By: Sherian ReinWei-Chen  Lin M.D.   On: 09/12/2018 15:40   Assessment: 33 y.o female with past medical history of seizure disorder, anemia, chronic daily headaches and recent ectopic pregnancy presenting to the ED on 09/13/2018 with witnessed seizure-like activity. Etiology likely medication induced from Tramadol which could have potentially lowered the seizure threshold in a patient who has hx of seizure disorder on subtherapeutic dose of Topiramate.  Patient reports she has not been taking her topiramate consistently. She was started on Topiramate for seizures and headaches.  She has had prior work-up including MRI of the brain which was normal with no acute intracranial abnormality noted. 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  No further work-up indicated at this point.  Recommendations 1.  Discontinue tramadol due to potential of lowering seizure threshold 2.  Discontinue topiramate due to possible risk of the teratogenic effect as patient wishes to become pregnant in the future. 3.  Start Keppra to 500mg  po BID.  Will remain on this at discharge for outpatient physician to make further decisions on anticonvulsant therapy.   4. Seizure precautions 5. Ativan prn seizure activity  6. Recommend magnesium and B complex for headaches 6. Patient unable to  drive, operate heavy machinery, perform activities at heights and participate in water activities until release by outpatient physician.  This patient was staffed with  Dr. Loretha Brasil, Doyle Askew who personally evaluated patient, reviewed documentation and agreed with assessment and plan of care as above.  Webb Silversmith, DNP, FNP-BC Board certified Nurse Practitioner Neurology Department  09/14/2018, 12:59 PM

## 2018-09-15 ENCOUNTER — Other Ambulatory Visit: Payer: Medicaid Other

## 2018-09-15 DIAGNOSIS — O00101 Right tubal pregnancy without intrauterine pregnancy: Secondary | ICD-10-CM

## 2018-09-15 LAB — HIV ANTIBODY (ROUTINE TESTING W REFLEX): HIV Screen 4th Generation wRfx: NONREACTIVE

## 2018-09-15 LAB — TOPIRAMATE LEVEL: Topiramate Lvl: NOT DETECTED ug/mL (ref 2.0–25.0)

## 2018-09-16 LAB — URINE CULTURE: Culture: >=100000 — AB

## 2018-09-16 LAB — BETA HCG QUANT (REF LAB): hCG Quant: 3626 m[IU]/mL

## 2018-09-18 ENCOUNTER — Encounter: Payer: Self-pay | Admitting: Emergency Medicine

## 2018-09-18 ENCOUNTER — Observation Stay
Admission: EM | Admit: 2018-09-18 | Discharge: 2018-09-19 | Disposition: A | Discharge status: Home / Self Care | Payer: Medicaid Other | Attending: Internal Medicine | Admitting: Internal Medicine

## 2018-09-18 ENCOUNTER — Emergency Department: Payer: Medicaid Other

## 2018-09-18 DIAGNOSIS — Z9119 Patient's noncompliance with other medical treatment and regimen: Secondary | ICD-10-CM | POA: Insufficient documentation

## 2018-09-18 DIAGNOSIS — O009 Unspecified ectopic pregnancy without intrauterine pregnancy: Secondary | ICD-10-CM | POA: Insufficient documentation

## 2018-09-18 DIAGNOSIS — Z87891 Personal history of nicotine dependence: Secondary | ICD-10-CM | POA: Insufficient documentation

## 2018-09-18 DIAGNOSIS — R569 Unspecified convulsions: Secondary | ICD-10-CM

## 2018-09-18 DIAGNOSIS — G40901 Epilepsy, unspecified, not intractable, with status epilepticus: Principal | ICD-10-CM | POA: Insufficient documentation

## 2018-09-18 DIAGNOSIS — Z9114 Patient's other noncompliance with medication regimen: Secondary | ICD-10-CM | POA: Diagnosis not present

## 2018-09-18 LAB — BASIC METABOLIC PANEL
Anion gap: 7 (ref 5–15)
BUN: 14 mg/dL (ref 6–20)
CO2: 27 mmol/L (ref 22–32)
Calcium: 9.1 mg/dL (ref 8.9–10.3)
Chloride: 105 mmol/L (ref 98–111)
Creatinine, Ser: 1.08 mg/dL — ABNORMAL HIGH (ref 0.44–1.00)
GFR calc Af Amer: >60 mL/min (ref >60)
GFR calc non Af Amer: >60 mL/min (ref >60)
Glucose, Bld: 93 mg/dL (ref 70–99)
Potassium: 4.1 mmol/L (ref 3.5–5.1)
Sodium: 139 mmol/L (ref 135–145)

## 2018-09-18 LAB — URINALYSIS, COMPLETE (UACMP) WITH MICROSCOPIC
Bilirubin Urine: NEGATIVE
Glucose, UA: NEGATIVE mg/dL
Hgb urine dipstick: NEGATIVE
Ketones, ur: NEGATIVE mg/dL
Leukocytes, UA: NEGATIVE
Nitrite: NEGATIVE
Protein, ur: NEGATIVE mg/dL
Specific Gravity, Urine: 1.045 — ABNORMAL HIGH (ref 1.005–1.030)
pH: 6 (ref 5.0–8.0)

## 2018-09-18 LAB — URINE DRUG SCREEN, QUALITATIVE (ARMC ONLY)
Amphetamines, Ur Screen: NOT DETECTED
Barbiturates, Ur Screen: NOT DETECTED
Benzodiazepine, Ur Scrn: POSITIVE — AB
Cannabinoid 50 Ng, Ur ~~LOC~~: NOT DETECTED
Cocaine Metabolite,Ur ~~LOC~~: NOT DETECTED
MDMA (Ecstasy)Ur Screen: NOT DETECTED
Methadone Scn, Ur: NOT DETECTED
Opiate, Ur Screen: NOT DETECTED
Phencyclidine (PCP) Ur S: NOT DETECTED
Tricyclic, Ur Screen: POSITIVE — AB

## 2018-09-18 LAB — HCG, QUANTITATIVE, PREGNANCY: hCG, Beta Chain, Quant, S: 5556 m[IU]/mL — ABNORMAL HIGH (ref <5)

## 2018-09-18 MED ORDER — MIDAZOLAM HCL 2 MG/2ML IJ SOLN
2.0000 mg | Freq: Once | INTRAMUSCULAR | Status: AC
  Administered 2018-09-18: 2 mg via INTRAVENOUS

## 2018-09-18 MED ORDER — ONDANSETRON HCL 4 MG PO TABS: 4.0000 mg | ORAL_TABLET | Freq: Four times a day (QID) | ORAL | Status: DC | PRN

## 2018-09-18 MED ORDER — MIDAZOLAM HCL 2 MG/2ML IJ SOLN
INTRAMUSCULAR | Status: AC
  Administered 2018-09-18: 2 mg via INTRAVENOUS
  Filled 2018-09-18: qty 4

## 2018-09-18 MED ORDER — ALBUTEROL SULFATE (2.5 MG/3ML) 0.083% IN NEBU: 2.5000 mg | INHALATION_SOLUTION | Freq: Four times a day (QID) | RESPIRATORY_TRACT | Status: DC

## 2018-09-18 MED ORDER — ACETAMINOPHEN 325 MG PO TABS
650.0000 mg | ORAL_TABLET | Freq: Four times a day (QID) | ORAL | Status: DC | PRN
  Administered 2018-09-19: 11:00:00 650 mg via ORAL
  Filled 2018-09-18: qty 2

## 2018-09-18 MED ORDER — ENOXAPARIN SODIUM 40 MG/0.4ML ~~LOC~~ SOLN: 40.0000 mg | SUBCUTANEOUS | Status: DC

## 2018-09-18 MED ORDER — LEVETIRACETAM 500 MG PO TABS
1000.0000 mg | ORAL_TABLET | Freq: Once | ORAL | Status: AC
  Administered 2018-09-18: 1000 mg via ORAL
  Filled 2018-09-18: qty 2

## 2018-09-18 MED ORDER — SODIUM CHLORIDE 0.9 % IV BOLUS
1000.0000 mL | Freq: Once | INTRAVENOUS | Status: AC
  Administered 2018-09-18: 1000 mL via INTRAVENOUS

## 2018-09-18 MED ORDER — ONDANSETRON HCL 4 MG/2ML IJ SOLN: 4.0000 mg | Freq: Four times a day (QID) | INTRAMUSCULAR | Status: DC | PRN

## 2018-09-18 MED ORDER — SODIUM CHLORIDE 0.9 % IV SOLN
750.0000 mg | Freq: Two times a day (BID) | INTRAVENOUS | Status: DC
  Filled 2018-09-18 (×2): qty 7.5

## 2018-09-18 MED ORDER — ACETAMINOPHEN 650 MG RE SUPP: 650.0000 mg | Freq: Four times a day (QID) | RECTAL | Status: DC | PRN

## 2018-09-18 MED ORDER — MIDAZOLAM HCL 2 MG/2ML IJ SOLN
INTRAMUSCULAR | Status: AC
  Filled 2018-09-18: qty 2

## 2018-09-18 MED ORDER — LEVETIRACETAM 750 MG PO TABS
750.0000 mg | ORAL_TABLET | Freq: Two times a day (BID) | ORAL | Status: DC
  Administered 2018-09-19 (×2): 750 mg via ORAL
  Filled 2018-09-18 (×3): qty 1

## 2018-09-18 MED ORDER — ALBUTEROL SULFATE (2.5 MG/3ML) 0.083% IN NEBU: 2.5000 mg | INHALATION_SOLUTION | Freq: Four times a day (QID) | RESPIRATORY_TRACT | Status: DC | PRN

## 2018-09-18 MED ORDER — LEVETIRACETAM IN NACL 1500 MG/100ML IV SOLN
1500.0000 mg | Freq: Once | INTRAVENOUS | Status: AC
  Administered 2018-09-18: 1500 mg via INTRAVENOUS
  Filled 2018-09-18: qty 100

## 2018-09-18 MED ORDER — IOHEXOL 300 MG/ML  SOLN
75.0000 mL | Freq: Once | INTRAMUSCULAR | Status: AC | PRN
  Administered 2018-09-18: 75 mL via INTRAVENOUS

## 2018-09-18 NOTE — Progress Notes (Signed)
Family Meeting Note  Advance Directive:yes  Today a meeting took place with the Patient.  Patient is able to participate   The following clinical team members were present during this meeting:MD  The following were discussed:Patient's diagnosis:sz , Patient's progosis: Unable to determine and Goals for treatment: Full Code  Additional follow-up to be provided: prn  Time spent during discussion:20 minutes  Milany Geck D Treesa Mccully, MD  

## 2018-09-18 NOTE — ED Notes (Signed)
Pt another seizure again   md aware.

## 2018-09-18 NOTE — ED Notes (Signed)
Family with pt.   nsr on monitor.  Iv in place.

## 2018-09-18 NOTE — ED Notes (Signed)
perwick place at pt request   Lm edt

## 2018-09-18 NOTE — H&P (Signed)
[employer] - [place] at [facility]   PATIENT NAME: Jill [name]    MR#:  [id number]  DATE OF BIRTH:  Mar 26, 1986  DATE OF ADMISSION:  09/18/2018  PRIMARY CARE PHYSICIAN: [facility]   REQUESTING/REFERRING PHYSICIAN:   CHIEF COMPLAINT:   Chief Complaint  Patient presents with  . Seizures    HISTORY OF PRESENT ILLNESS: Jill [name]  is a 33 y.o. female with a known history per below recent hospital discharge a couple days ago for acute seizure with known history of epilepsy, discharged on Keppra, returns with recurrent seizure, patient given increased dose of IV Keppra, ER work-up noted for urine drug screen for benzos/try cyclic's, CT head negative, UA negative, patient valuated in the emergency room, family at bedside, patient in no apparent distress, patient is now been admitted for acute recurrent breakthrough seizure.  PAST MEDICAL HISTORY:   Past Medical History:  Diagnosis Date  . Anemia   . Headache   . Seizures (HCC)     PAST SURGICAL HISTORY:  Past Surgical History:  Procedure Laterality Date  . CESAREAN SECTION     x4  . CESAREAN SECTION N/A 10/07/2016   Procedure: CESAREAN SECTION;  Surgeon: Suzy Bouchardhomas J Schermerhorn, MD;  Location: [facility] ORS;  Service: Obstetrics;  Laterality: N/A;    SOCIAL HISTORY:  Social History   Tobacco Use  . Smoking status: Former Games developermoker  . Smokeless tobacco: Never Used  Substance Use Topics  . Alcohol use: No    FAMILY HISTORY:  Family History  Problem Relation Age of Onset  . Healthy Mother   . Healthy Father   . Cancer Neg Hx   . Thyroid disease Neg Hx   . Diabetes Neg Hx     DRUG ALLERGIES: No Known Allergies  REVIEW OF SYSTEMS:   CONSTITUTIONAL: No fever, fatigue or weakness.  EYES: No blurred or double vision.  EARS, NOSE, AND THROAT: No tinnitus or ear pain.  RESPIRATORY: No cough, shortness of breath, wheezing or hemoptysis.  CARDIOVASCULAR: No chest pain, orthopnea,  edema.  GASTROINTESTINAL: No nausea, vomiting, diarrhea or abdominal pain.  GENITOURINARY: No dysuria, hematuria.  ENDOCRINE: No polyuria, nocturia,  HEMATOLOGY: No anemia, easy bruising or bleeding SKIN: No rash or lesion. MUSCULOSKELETAL: No joint pain or arthritis.   NEUROLOGIC: No tingling, numbness, weakness. +sz PSYCHIATRY: No anxiety or depression.   MEDICATIONS AT HOME:  Prior to Admission medications   Medication Sig Start Date End Date Taking? Authorizing Provider  levETIRAcetam (KEPPRA) 500 MG tablet Take 1 tablet (500 mg total) by mouth 2 (two) times daily. 09/14/18 11/13/18 Yes Sainani, Rolly PancakeVivek J, MD      PHYSICAL EXAMINATION:   VITAL SIGNS: Blood pressure 103/71, pulse (!) 102, temperature 98.1 F (36.7 C), temperature source Oral, resp. rate 17, height 5\' 3"  (1.6 m), weight 91.2 kg, last menstrual period 09/02/2018, SpO2 100 %.  GENERAL:  33 y.o.-year-old patient lying in the bed with no acute distress.  Obese, nontoxic-appearing EYES: Pupils equal, round, reactive to light and accommodation. No scleral icterus. Extraocular muscles intact.  HEENT: Head atraumatic, normocephalic. Oropharynx and nasopharynx clear.  NECK:  Supple, no jugular venous distention. No thyroid enlargement, no tenderness.  LUNGS: Normal breath sounds bilaterally, no wheezing, rales,rhonchi or crepitation. No use of accessory muscles of respiration.  CARDIOVASCULAR: S1, S2 normal. No murmurs, rubs, or gallops.  ABDOMEN: Soft, nontender, nondistended. Bowel sounds present. No organomegaly or mass.  EXTREMITIES: No pedal edema, cyanosis, or clubbing.  NEUROLOGIC: Cranial  nerves II through XII are intact. Muscle strength 5/5 in all extremities. Sensation intact. Gait not checked.  PSYCHIATRIC: The patient is alert and oriented x 3.  SKIN: No obvious rash, lesion, or ulcer.   LABORATORY PANEL:   CBC Recent Labs  Lab 09/12/18 0927 09/13/18 2226 09/14/18 0423  WBC 7.0 8.0 7.7  HGB 11.0* 10.7* 9.7*   HCT 35.3* 34.3* 30.6*  PLT 284 315 283  MCV 87.2 85.1 85.7  MCH 27.2 26.6 27.2  MCHC 31.2 31.2 31.7  RDW 14.0 13.8 14.0   ------------------------------------------------------------------------------------------------------------------  Chemistries  Recent Labs  Lab 09/12/18 0927 09/13/18 2226 09/14/18 0423 09/18/18 1500  NA 139 137 138 139  K 3.9 3.6 3.6 4.1  CL 107 107 108 105  CO2 25 23 24 27   GLUCOSE 97 104* 117* 93  BUN 10 11 9 14   CREATININE 0.95 0.90 0.78 1.08*  CALCIUM 9.1 9.1 8.5* 9.1  AST 15 16  --   --   ALT 11 12  --   --   ALKPHOS 67 69  --   --   BILITOT 0.7 0.4  --   --    ------------------------------------------------------------------------------------------------------------------ estimated creatinine clearance is 81.6 mL/min (A) (by C-G formula based on SCr of 1.08 mg/dL (H)). ------------------------------------------------------------------------------------------------------------------ No results for input(s): TSH, T4TOTAL, T3FREE, THYROIDAB in the last 72 hours.  Invalid input(s): FREET3   Coagulation profile No results for input(s): INR, PROTIME in the last 168 hours. ------------------------------------------------------------------------------------------------------------------- No results for input(s): DDIMER in the last 72 hours. -------------------------------------------------------------------------------------------------------------------  Cardiac Enzymes No results for input(s): CKMB, TROPONINI, MYOGLOBIN in the last 168 hours.  Invalid input(s): CK ------------------------------------------------------------------------------------------------------------------ Invalid input(s): POCBNP  ---------------------------------------------------------------------------------------------------------------  Urinalysis    Component Value Date/Time   COLORURINE YELLOW (A) 09/18/2018 1730   APPEARANCEUR HAZY (A) 09/18/2018 1730    APPEARANCEUR Clear 04/20/2014 1426   LABSPEC 1.045 (H) 09/18/2018 1730   LABSPEC 1.004 04/20/2014 1426   PHURINE 6.0 09/18/2018 1730   GLUCOSEU NEGATIVE 09/18/2018 1730   GLUCOSEU Negative 04/20/2014 1426   HGBUR NEGATIVE 09/18/2018 1730   BILIRUBINUR NEGATIVE 09/18/2018 1730   BILIRUBINUR Negative 04/20/2014 1426   KETONESUR NEGATIVE 09/18/2018 1730   PROTEINUR NEGATIVE 09/18/2018 1730   NITRITE NEGATIVE 09/18/2018 1730   LEUKOCYTESUR NEGATIVE 09/18/2018 1730   LEUKOCYTESUR 1+ 04/20/2014 1426     RADIOLOGY: Ct Head W Or Wo Contrast  Result Date: 09/18/2018 CLINICAL DATA:  New onset seizure. EXAM: CT HEAD WITHOUT AND WITH CONTRAST TECHNIQUE: Contiguous axial images were obtained from the base of the skull through the vertex without and with intravenous contrast CONTRAST:  75mL OMNIPAQUE IOHEXOL 300 MG/ML  SOLN COMPARISON:  04/14/2017 FINDINGS: Brain: There is no mass, hemorrhage or extra-axial collection. The size and configuration of the ventricles and extra-axial CSF spaces are normal. The brain parenchyma is normal, without evidence of acute or chronic infarction. No abnormal enhancement. Vascular: No abnormal hyperdensity of the major intracranial arteries or dural venous sinuses. No intracranial atherosclerosis. Skull: The visualized skull base, calvarium and extracranial soft tissues are normal. Sinuses/Orbits: No fluid levels or advanced mucosal thickening of the visualized paranasal sinuses. No mastoid or middle ear effusion. The orbits are normal. IMPRESSION: Normal CT of the brain with and without contrast. Electronically Signed   By: Deatra RobinsonKevin  Herman M.D.   On: 09/18/2018 17:08    EKG: Orders placed or performed during the hospital encounter of 09/13/18  . ED EKG  . ED EKG  . EKG 12-Lead  . EKG 12-Lead  IMPRESSION AND PLAN: 33 year old female with past medical history of epilepsy, chronic anemia, headaches, discharged 4 days ago for recurrent seizure, presents with repeat  seizure  *Acute breakthrough seizure with history of epilepsy  Last admission patient was transitioned from Topamax to Keppra, neurology did see patient while in house, head recently diagnosed with an ectopic pregnancy and given methotrexate, and then presented with a breakthrough seizure Referred to the observation unit on our seizure protocol, continue Keppra at increased dose of 750 mg p.o. twice daily, seizure precautions, IV fluids for rehydration, and continue close medical monitoring Neurology to see in the morning  *Acute recent Ectopic pregnancy Status post recent methotrexate Continue conservative medical management Transvaginal ultrasound done last admission noted for adnexal cyst We will need to follow-up with OB/GYN status post discharge for continued care  *History of anemia Stable  *History of headaches Stable  Disposition to home on tomorrow with neurology clearance barring any complications  All the records are reviewed and case discussed with ED provider. Management plans discussed with the patient, family and they are in agreement.  CODE STATUS:full Code Status History    Date Active Date Inactive Code Status Order ID Comments User Context   09/14/2018 0147 09/14/2018 1900 Full Code [id number]  Montez Morita, MD Inpatient   10/07/2016 0619 10/08/2016 0203 Full Code [id number]  Schermerhorn, Ihor Austin, MD Inpatient   10/07/2016 0550 10/07/2016 0619 Full Code [id number]  Angelia Mould, RN Inpatient   06/29/2016 1350 06/29/2016 2014 Full Code [id number]  Sharee Pimple, CNM Inpatient       TOTAL TIME TAKING CARE OF THIS PATIENT: 35 minutes.    Evelena Jill Aasim Restivo M.D on 09/18/2018   Between 7am to 6pm - Pager - [phone]  After 6pm go to [email or web address] - Social research officer, government  [facility]  Office  [phone]  CC: Primary care physician; [facility]   Note: This dictation was prepared with Nurse, children's dictation along with  smaller phrase technology. Any transcriptional errors that result from this process are unintentional.

## 2018-09-18 NOTE — ED Notes (Signed)
ED Provider at bedside. 

## 2018-09-18 NOTE — ED Provider Notes (Addendum)
[facility] Emergency Department Provider Note    First MD Initiated Contact with Patient 09/18/18 1501     (approximate)  I have reviewed the triage vital signs and the nursing notes.   HISTORY  Chief Complaint Seizures    HPI Jill [name] is a 33 y.o. female with a history of seizure disorder with recent admission to the hospital medication changes from Topamax to Keppra presents the ER after witnessed seizure activity again today.  Patient does describe generalized body aches at this time.  Had a second witnessed episode upon arrival to the ER that was very brief in nature without any significant postictal..  Patient was given IM Versed in route due to seizure activity.  Reported as generalized tonic-clonic with LOC.  Patient states that she is been compliant with her medications but on admission to the hospital in the first of this month was noncompliant at that time.      Past Medical History:  Diagnosis Date  . Anemia   . Headache   . Seizures (HCC)    Family History  Problem Relation Age of Onset  . Healthy Mother   . Healthy Father   . Cancer Neg Hx   . Thyroid disease Neg Hx   . Diabetes Neg Hx    Past Surgical History:  Procedure Laterality Date  . CESAREAN SECTION     x4  . CESAREAN SECTION N/A 10/07/2016   Procedure: CESAREAN SECTION;  Surgeon: Suzy Bouchardhomas J Schermerhorn, MD;  Location: [facility] ORS;  Service: Obstetrics;  Laterality: N/A;   Patient Active Problem List   Diagnosis Date Noted  . Seizure (HCC) 09/14/2018  . Chronic kidney disease   . Episode of loss of consciousness   . Pregnancy 10/07/2016  . Fetal bradycardia, antepartum condition or complication 10/07/2016  . Post-operative state 10/07/2016  . Vaginal bleeding in pregnancy, second trimester 06/29/2016  . Indication for care in labor or delivery 06/29/2016  . Fall as cause of accidental injury in home as place of occurrence 06/23/2016      Prior to Admission  medications   Medication Sig Start Date End Date Taking? Authorizing Provider  levETIRAcetam (KEPPRA) 500 MG tablet Take 1 tablet (500 mg total) by mouth 2 (two) times daily. 09/14/18 11/13/18  Houston SirenSainani, Vivek J, MD    Allergies Patient has no known allergies.    Social History Social History   Tobacco Use  . Smoking status: Former Games developermoker  . Smokeless tobacco: Never Used  Substance Use Topics  . Alcohol use: No  . Drug use: No    Review of Systems Patient denies headaches, rhinorrhea, blurry vision, numbness, shortness of breath, chest pain, edema, cough, abdominal pain, nausea, vomiting, diarrhea, dysuria, fevers, rashes or hallucinations unless otherwise stated above in HPI. ____________________________________________   PHYSICAL EXAM:  VITAL SIGNS: Vitals:   09/18/18 1830 09/18/18 1900  BP: 101/61 (!) 92/55  Pulse: 93 89  Resp: 17 16  Temp:    SpO2: 99% 99%    Constitutional: Alert and oriented.  Eyes: Conjunctivae are normal.  Head: Atraumatic. Nose: No congestion/rhinnorhea. Mouth/Throat: Mucous membranes are moist.  No tongue laceration Neck: No stridor. Painless ROM.  Cardiovascular: Normal rate, regular rhythm. Grossly normal heart sounds.  Good peripheral circulation. Respiratory: Normal respiratory effort.  No retractions. Lungs CTAB. Gastrointestinal: Soft and nontender. No distention. No abdominal bruits. No CVA tenderness. Genitourinary: no urinary incontinence Musculoskeletal: No lower extremity tenderness nor edema.  No joint effusions. Neurologic:  Normal  speech and language. No gross focal neurologic deficits are appreciated. No facial droop Skin:  Skin is warm, dry and intact. No rash noted. Psychiatric: Mood and affect are normal. Speech and behavior are normal.  ____________________________________________   LABS (all labs ordered are listed, but only abnormal results are displayed)  Results for orders placed or performed during the hospital  encounter of 09/18/18 (from the past 24 hour(s))  Basic metabolic panel     Status: Abnormal   Collection Time: 09/18/18  3:00 PM  Result Value Ref Range   Sodium 139 135 - 145 mmol/L   Potassium 4.1 3.5 - 5.1 mmol/L   Chloride 105 98 - 111 mmol/L   CO2 27 22 - 32 mmol/L   Glucose, Bld 93 70 - 99 mg/dL   BUN 14 6 - 20 mg/dL   Creatinine, Ser 1.44 (H) 0.44 - 1.00 mg/dL   Calcium 9.1 8.9 - 31.5 mg/dL   GFR calc non Af Amer >60 >60 mL/min   GFR calc Af Amer >60 >60 mL/min   Anion gap 7 5 - 15  hCG, quantitative, pregnancy     Status: Abnormal   Collection Time: 09/18/18  3:00 PM  Result Value Ref Range   hCG, Beta Chain, Quant, S 5,556 (H) <5 mIU/mL  Urinalysis, Complete w Microscopic     Status: Abnormal   Collection Time: 09/18/18  5:30 PM  Result Value Ref Range   Color, Urine YELLOW (A) YELLOW   APPearance HAZY (A) CLEAR   Specific Gravity, Urine 1.045 (H) 1.005 - 1.030   pH 6.0 5.0 - 8.0   Glucose, UA NEGATIVE NEGATIVE mg/dL   Hgb urine dipstick NEGATIVE NEGATIVE   Bilirubin Urine NEGATIVE NEGATIVE   Ketones, ur NEGATIVE NEGATIVE mg/dL   Protein, ur NEGATIVE NEGATIVE mg/dL   Nitrite NEGATIVE NEGATIVE   Leukocytes, UA NEGATIVE NEGATIVE   RBC / HPF 0-5 0 - 5 RBC/hpf   WBC, UA 0-5 0 - 5 WBC/hpf   Bacteria, UA RARE (A) NONE SEEN   Squamous Epithelial / LPF 0-5 0 - 5   Mucus PRESENT   Urine Drug Screen, Qualitative ([facility] only)     Status: Abnormal   Collection Time: 09/18/18  5:30 PM  Result Value Ref Range   Tricyclic, Ur Screen POSITIVE (A) NONE DETECTED   Amphetamines, Ur Screen NONE DETECTED NONE DETECTED   MDMA (Ecstasy)Ur Screen NONE DETECTED NONE DETECTED   Cocaine Metabolite,Ur [place] NONE DETECTED NONE DETECTED   Opiate, Ur Screen NONE DETECTED NONE DETECTED   Phencyclidine (PCP) Ur S NONE DETECTED NONE DETECTED   Cannabinoid 50 Ng, Ur Melbeta NONE DETECTED NONE DETECTED   Barbiturates, Ur Screen NONE DETECTED NONE DETECTED   Benzodiazepine, Ur Scrn POSITIVE (A) NONE  DETECTED   Methadone Scn, Ur NONE DETECTED NONE DETECTED   ____________________________________________  EKG My review and personal interpretation at Time: 15:01   Indication: sz  Rate: 120  Rhythm: sinus Axis: normal Other: nonspecific st and t wave abn ____________________________________________  RADIOLOGY  I personally reviewed all radiographic images ordered to evaluate for the above acute complaints and reviewed radiology reports and findings.  These findings were personally discussed with the patient.  Please see medical record for radiology report.  ____________________________________________   PROCEDURES  Procedure(s) performed:  .Critical Care Performed by: Willy Eddy, MD Authorized by: Willy Eddy, MD   Critical care provider statement:    Critical care time (minutes):  30   Critical care time was exclusive of:  Separately billable procedures and treating other patients   Critical care was necessary to treat or prevent imminent or life-threatening deterioration of the following conditions:  CNS failure or compromise   Critical care was time spent personally by me on the following activities:  Development of treatment plan with patient or surrogate, discussions with consultants, evaluation of patient's response to treatment, examination of patient, obtaining history from patient or surrogate, ordering and performing treatments and interventions, ordering and review of laboratory studies, ordering and review of radiographic studies, pulse oximetry, re-evaluation of patient's condition and review of old charts      Critical Care performed: yes ____________________________________________   INITIAL IMPRESSION / ASSESSMENT AND PLAN / ED COURSE  Pertinent labs & imaging results that were available during my care of the patient were reviewed by me and considered in my medical decision making (see chart for details).   DDX: Status, seizure, electrolyte  abnormality, withdrawal, pseudoseizure  Jill [name] is a 33 y.o. who presents to the ED with symptoms as described above.  Patient with evidence of seizure and postictal period.  Currently protecting her airway.  Mildly tachycardic.  Moving out of bed patient developed generalized tonic-clonic seizure.  Did begin foaming from the mouth.  IV versed ordered and will load with keppra iv.  The patient will be placed on continuous pulse oximetry and telemetry for monitoring.  Laboratory evaluation will be sent to evaluate for the above complaints.     Clinical Course as of Sep 18 1942  Mon Sep 18, 2018  1521 Patient reassessed and is well-appearing protecting her airway.  No focal neuro deficits.  States she was feeling nauseated earlier today.   [PR]  1620 Patient again reassessed.  States that she feels weak and tired.  States that she is very stressed out over financial bills.  Denies any numbness or tingling at this time.  Will order neuro imaging as she is not had any in the seems to be having increasing frequency.  Still exclude any sort of anatomic abnormality.  Doubt infectious process.   [PR]  1632 Patient went is to have another seizure-like episode.  When assessed patient.  Lasted roughly 15 seconds generalized tonic-clonic shaking.  Patient not biting her mouth.  Immediately after the seizure episode did open eyes and is tracking.   [PR]  1646 Repeat hCG is elevated.  Consulted with Dr. Valentino Saxon regarding this finding and states that this is not unusual after medical treatment with methotrexate.  No need for repeat hCG until 2 days from now which point they will decide on need for additional chemotherapy.   [PR]  1941 Discussed case with neurology regarding the patient's presentation now having 3 seizures.  She is not having any evidence of active seizure right now but has not returned to her baseline after observing her for 5 hours.  At this point do feel patient will require observation  in the hospital for neurology consultation.  We will increase her Keppra to 1 g twice daily.   [PR]    Clinical Course User Index [PR] Willy Eddy, MD     As part of my medical decision making, I reviewed the following data within the electronic MEDICAL RECORD NUMBER Nursing notes reviewed and incorporated, Labs reviewed, notes from prior ED visits and [place] Controlled Substance Database   ____________________________________________   FINAL CLINICAL IMPRESSION(S) / ED DIAGNOSES  Final diagnoses:  Status epilepticus (HCC)      NEW MEDICATIONS STARTED DURING THIS VISIT:  New Prescriptions   No medications on file     Note:  This document was prepared using Dragon voice recognition software and may include unintentional dictation errors.    Willy Eddy, MD 09/18/18 1944    Willy Eddy, MD 09/18/18 1944

## 2018-09-18 NOTE — ED Notes (Signed)
Resumed care from valerie rn.  Pt alert.  Iv in place.  Sinus tach on monitor.  meds started.  Seizure pads in place.  Pt reports a headache.

## 2018-09-18 NOTE — ED Notes (Signed)
Pt had a seizure   md at bedside   Seizure stopped.  meds given again  Pt to ct scan  Pt alert and talking after seizure

## 2018-09-18 NOTE — ED Notes (Signed)
Report called to sara rn floor nurse 

## 2018-09-18 NOTE — ED Notes (Signed)
Family with pt.  nsr on monitor.  Pt sleepy  siderails up x 2.  Iv fluids infusing.

## 2018-09-18 NOTE — ED Triage Notes (Signed)
Pt in via EMS from home with c/o seizure. EMS reports per pt mom, pt with no hx of seizure but received a chemo injection on 12/31 for an ectopic pregnancy and had a seizure on 1/1. Pt was see and evaluated for 24 hours and sent home. Pt then had another seizure today.   105/72, 98%, HR 114, CBG 114  2mg  versed administered IM by EMS at 14:30

## 2018-09-19 ENCOUNTER — Other Ambulatory Visit: Payer: Self-pay

## 2018-09-19 ENCOUNTER — Telehealth: Payer: Self-pay

## 2018-09-19 DIAGNOSIS — R569 Unspecified convulsions: Secondary | ICD-10-CM

## 2018-09-19 NOTE — Progress Notes (Signed)
Patient made aware that she is being discharged- patient calling for transportation to come and pick her up

## 2018-09-19 NOTE — Consult Note (Addendum)
Reason for Consult: Seizures Referring Physician: Altamese DillingVachhani, Vaibhavkumar, MD  CC: Seizure-like activity  HPI: Jill [name] is an 33 y.o. female Jill [name] is an 33 y.o. female with past medical history of seizure disorder, anemia, chronic daily headaches and recent ectopic pregnancy presenting to the ED on 09/18/2018 with witnessed seizure-like activity.  On arrival to the ED patient had a second witnessed seizure that was very brief in nature without any loss of bladder or bowel control and no post ictal state noted.  Patient was given IM Versed on route to the ED per ED reports.  Patient was recently admitted to the hospital for witnessed seizures activity on 09/13/2017.  Prior to the episode she had been evaluated in the ED on 09/12/2018 due to complaints of bleeding post laparoscopy site with fullness and discomfort in her abdomen and back.  Pregnancy test at that time was noted to be positive, with no visible IUP noted on ultrasound, along with right adnexal mass concerning for ectopic pregnancy. She was started on methotrexate in the ER and discharged on Ultram and Tylenol.  During the course of her admission her  medication was changed from topiramate to Keppra 500 mg twice daily, Ultram was discontinued and patient discharged to follow-up with her neurologist.  Patient report that she missed a dose of Keppra the morning of her seizure episode.  At her last admission patient and husband reported that she was noncompliant with topiramate due to side effects. Her last follow up with her neurologist Dr. 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  Initial labs in the ED yesterday was unremarkable.  Patient was admitted for further evaluation and  monitoring  Past Medical History:  Diagnosis Date  . Anemia   . Headache   . Seizures (HCC)     Past Surgical History:  Procedure Laterality Date  . CESAREAN SECTION     x4  . CESAREAN SECTION N/A 10/07/2016   Procedure: CESAREAN SECTION;  Surgeon: Suzy Bouchardhomas J Schermerhorn, MD;  Location: [facility] ORS;  Service: Obstetrics;  Laterality: N/A;    Family History  Problem Relation Age of Onset  . Healthy Mother   . Healthy Father   . Cancer Neg Hx   . Thyroid disease Neg Hx   . Diabetes Neg Hx     Social History:  reports that she has quit smoking. She has never used smokeless tobacco. She reports that she does not drink alcohol or use drugs.  No Known Allergies  Medications:  I have reviewed the patient's current medications. Prior to Admission:  Medications Prior to Admission  Medication Sig Dispense Refill Last Dose  . levETIRAcetam (KEPPRA) 500 MG tablet Take 1 tablet (500 mg total) by mouth 2 (two) times daily. 60 tablet 1 09/17/2018 at Unknown time   Scheduled: . enoxaparin (LOVENOX) injection  40 mg Subcutaneous Q24H  . levETIRAcetam  750 mg Oral BID    ROS: History obtained from the patient   General ROS: negative for - chills, fatigue, fever, night sweats, weight gain or weight loss Psychological ROS: negative for - behavioral disorder, hallucinations, memory difficulties, mood swings or suicidal ideation Ophthalmic ROS: negative for - blurry vision, double vision, eye pain or loss of vision ENT ROS: negative for - epistaxis, nasal discharge, oral lesions,  sore throat, tinnitus or vertigo Allergy and Immunology ROS: negative for - hives or itchy/watery eyes Hematological and Lymphatic ROS: negative for - bleeding problems, bruising or swollen lymph nodes Endocrine ROS: negative for - galactorrhea, hair pattern changes, polydipsia/polyuria or temperature intolerance Respiratory ROS: negative for - cough, hemoptysis, shortness of breath or wheezing Cardiovascular ROS:  negative for - chest pain, dyspnea on exertion, edema or irregular heartbeat Gastrointestinal ROS: negative for - abdominal pain, diarrhea, hematemesis, nausea/vomiting or stool incontinence Genito-Urinary ROS: negative for - dysuria, hematuria, incontinence or urinary frequency/urgency Musculoskeletal ROS: negative for - joint swelling or muscular weakness Neurological ROS: as noted in HPI Dermatological ROS: negative for rash and skin lesion changes  Physical Examination: Blood pressure (!) 92/55, pulse 94, temperature 98.3 F (36.8 C), temperature source Oral, resp. rate 18, height 5\' 3"  (1.6 m), weight 91.2 kg, last menstrual period 09/02/2018, SpO2 99 %.  HEENT-  Normocephalic, no lesions, without obvious abnormality.  Normal external eye and conjunctiva.  Normal TM's bilaterally.  Normal auditory canals and external ears. Normal external nose, mucus membranes and septum.  Normal pharynx. Cardiovascular- S1, S2 normal, pulses palpable throughout   Lungs- chest clear, no wheezing, rales, normal symmetric air entry Abdomen- soft, non-tender; bowel sounds normal; no masses,  no organomegaly Extremities- no edema Lymph-no adenopathy palpable Musculoskeletal-no joint tenderness, deformity or swelling Skin-warm and dry, no hyperpigmentation, vitiligo, or suspicious lesions  Neurological Exam   Mental Status: Alert, oriented, thought content appropriate.  Speech fluent without evidence of aphasia.  Able to follow 3 step commands without difficulty. Attention span and concentration seemed appropriate  Cranial Nerves: II: Discs flat bilaterally; Visual fields grossly normal, pupils equal, round, reactive to light and accommodation III,IV, VI: ptosis not present, extra-ocular motions intact bilaterally V,VII: smile symmetric, facial light touch sensation intact VIII: hearing normal bilaterally IX,X: gag reflex present XI: bilateral shoulder shrug XII: midline tongue extension Motor: Right  :  Upper extremity   5/5 Without pronator drift      Left: Upper extremity   5/5 without pronator drift Right:   Lower extremity   5/5                                          Left: Lower extremity   5/5 Tone and bulk:normal tone throughout; no atrophy noted Sensory: Pinprick and light touch intact bilaterally Deep Tendon Reflexes: 2+ and symmetric throughout Plantars: Right:  Downgoing                              Left: Downgoing Cerebellar: Finger-to-nose testing intact bilaterally. Heel to shin testing normal bilaterally Gait: not tested due to safety concerns  Data Reviewed  Laboratory Studies:   Basic Metabolic Panel: Recent Labs  Lab 09/13/18 2226 09/14/18 0423 09/18/18 1500  NA 137 138 139  K 3.6 3.6 4.1  CL 107 108 105  CO2 23 24 27   GLUCOSE 104* 117* 93  BUN 11 9 14   CREATININE 0.90 0.78 1.08*  CALCIUM 9.1 8.5* 9.1    Liver Function Tests: Recent Labs  Lab 09/13/18 2226  AST 16  ALT 12  ALKPHOS 69  BILITOT 0.4  PROT 7.3  ALBUMIN 4.1   No results for input(s): LIPASE, AMYLASE in the last 168 hours. No results for input(s): AMMONIA in the last 168 hours.  CBC:  Recent Labs  Lab 09/13/18 2226 09/14/18 0423  WBC 8.0 7.7  HGB 10.7* 9.7*  HCT 34.3* 30.6*  MCV 85.1 85.7  PLT 315 283    Cardiac Enzymes: No results for input(s): CKTOTAL, CKMB, CKMBINDEX, TROPONINI in the last 168 hours.  BNP: Invalid input(s): POCBNP  CBG: Recent Labs  Lab 09/14/18 0730  GLUCAP 92    Microbiology: Results for orders placed or performed during the hospital encounter of 09/13/18  Urine Culture     Status: Abnormal   Collection Time: 09/14/18  7:24 AM  Result Value Ref Range Status   Specimen Description   Final    URINE, RANDOM Performed at [facility], [address] [place], [place] [address]    Special Requests   Final    NONE Performed at [facility], [address] [place], [place] [address]    Culture >=100,000 COLONIES/mL  ESCHERICHIA COLI (A)  Final   Report Status 09/16/2018 FINAL  Final   Organism ID, Bacteria ESCHERICHIA COLI (A)  Final      Susceptibility   Escherichia coli - MIC*    AMPICILLIN >=32 RESISTANT Resistant     CEFAZOLIN 16 SENSITIVE Sensitive     CEFTRIAXONE <=1 SENSITIVE Sensitive     CIPROFLOXACIN >=4 RESISTANT Resistant     GENTAMICIN >=16 RESISTANT Resistant     IMIPENEM <=0.25 SENSITIVE Sensitive     NITROFURANTOIN <=16 SENSITIVE Sensitive     TRIMETH/SULFA >=320 RESISTANT Resistant     AMPICILLIN/SULBACTAM >=32 RESISTANT Resistant     PIP/TAZO 64 INTERMEDIATE Intermediate     Extended ESBL NEGATIVE Sensitive     * >=100,000 COLONIES/mL ESCHERICHIA COLI    Coagulation Studies: No results for input(s): LABPROT, INR in the last 72 hours.  Urinalysis:  Recent Labs  Lab 09/14/18 0724 09/18/18 1730  COLORURINE YELLOW* YELLOW*  LABSPEC 1.018 1.045*  PHURINE 6.0 6.0  GLUCOSEU NEGATIVE NEGATIVE  HGBUR LARGE* NEGATIVE  BILIRUBINUR NEGATIVE NEGATIVE  KETONESUR NEGATIVE NEGATIVE  PROTEINUR 30* NEGATIVE  NITRITE NEGATIVE NEGATIVE  LEUKOCYTESUR NEGATIVE NEGATIVE    Lipid Panel:  No results found for: CHOL, TRIG, HDL, CHOLHDL, VLDL, LDLCALC  HgbA1C: No results found for: HGBA1C  Urine Drug Screen:      Component Value Date/Time   LABOPIA NONE DETECTED 09/18/2018 1730   COCAINSCRNUR NONE DETECTED 09/18/2018 1730   LABBENZ POSITIVE (A) 09/18/2018 1730   AMPHETMU NONE DETECTED 09/18/2018 1730   THCU NONE DETECTED 09/18/2018 1730   LABBARB NONE DETECTED 09/18/2018 1730    Alcohol Level: No results for input(s): ETH in the last 168 hours.  Other results: EKG: normal EKG, normal sinus rhythm, unchanged from previous tracings.  Imaging: Ct Head W Or Wo Contrast  Result Date: 09/18/2018 CLINICAL DATA:  New onset seizure. EXAM: CT HEAD WITHOUT AND WITH CONTRAST TECHNIQUE: Contiguous axial images were obtained from the base of the skull through the vertex without and with  intravenous contrast CONTRAST:  75mL OMNIPAQUE IOHEXOL 300 MG/ML  SOLN COMPARISON:  04/14/2017 FINDINGS: Brain: There is no mass, hemorrhage or extra-axial collection. The size and configuration of the ventricles and extra-axial CSF spaces are normal. The brain parenchyma is normal, without evidence of acute or chronic infarction. No abnormal enhancement. Vascular: No abnormal hyperdensity of the major intracranial arteries or dural venous sinuses. No intracranial atherosclerosis. Skull: The visualized skull base, calvarium and extracranial soft tissues are normal. Sinuses/Orbits: No fluid levels or advanced mucosal thickening of the visualized paranasal sinuses. No mastoid or middle  ear effusion. The orbits are normal. IMPRESSION: Normal CT of the brain with and without contrast. Electronically Signed   By: Deatra Robinson M.D.   On: 09/18/2018 17:08   Patient seen and examined.  Clinical course and management discussed.  Necessary edits performed.  I agree with the above.  Assessment and plan of care developed and discussed below.    Assessment: 33 y.o female with past medical history of seizure disorder, anemia, chronic daily headaches and recent ectopic pregnancy presenting to the ED on 09/18/2018 with witnessed seizure-like activity.  Etiology likely breakthrough seizure due to noncompliance with antiseizure medication.  She was recently admitted with similar episode on 09/13/2018 which was thought to be  likely medication induced from Tramadol.  Patient was switched from topiramate to Keppra 500 mg twice daily.  However patient reports that she had missed her morning dose of Keppra the morning of her seizure-like activity.  Patient has history of noncompliance with her antiseizure medication despite her prior work-up including EEG done on 05/11/2017 which was abnormal due to generalized epileptiform discharges and independent focal left frontal discharges indicative of a primary generalized epilepsy or focal left  frontal epilepsy with secondary by synchrony.    She has had extensive work-up in the past for her seizure disorder therefore no further work-up needed at this point from neurology standpoint.  Recommendations 1. Continue Keppra  500mg  po BID. Will remain on this at discharge for outpatient physician to make further decisions on anticonvulsant therapy. Patient to keep scheduled appointment with outpatient neurology. 4. Seizure precautions 5. Ativan prn seizure activity  6.  Medication compliance counseling 7.Patient unable to drive, operate heavy machinery, perform activities at heights and participate in water activities until release by outpatient physician.  This patient was staffed with Dr. Verlon Au, Thad Ranger who personally evaluated patient, reviewed documentation and agreed with assessment and plan of care as above.  Webb Silversmith, DNP, FNP-BC Board certified Nurse Practitioner Neurology Department   09/19/2018, 11:02 AM    Thana Farr, MD Neurology [phone]  09/19/2018  4:00 PM

## 2018-09-19 NOTE — Discharge Summary (Signed)
[facility] [place] at [facility]   PATIENT NAME: Jill [name] Mcginnis    MR#:  [id number]  DATE OF BIRTH:  Feb 06, 1986  DATE OF ADMISSION:  09/18/2018 ADMITTING PHYSICIAN: Bertrum SolMontell D Salary, MD  DATE OF DISCHARGE: 09/19/2018   PRIMARY CARE PHYSICIAN: [facility]    ADMISSION DIAGNOSIS:  Status epilepticus (HCC) [G40.901]  DISCHARGE DIAGNOSIS:  Active Problems:   Seizure (HCC)   SECONDARY DIAGNOSIS:   Past Medical History:  Diagnosis Date  . Anemia   . Headache   . Seizures [facility](HCC)     HOSPITAL COURSE:   *Breakthrough seizure Due to noncompliance to medication  Patient confirms that she did not take her morning dose of Keppra before presenting with seizure to emergency room. Neurologist has seen the patient.  Initially she was given IV loading dose of Keppra in ER and higher dose of oral Keppra by admitting doctor.  Due to her noncompliance history, neurology suggested no change in her home dose of Keppra and discharge her with instructions again on not to miss seizure medications.  We are discharging her with same dose Keppra 500 mg 2 times a day.  DISCHARGE CONDITIONS:   Stable  CONSULTS OBTAINED:  Treatment Team:  Thana Farreynolds, Leslie, MD  DRUG ALLERGIES:  No Known Allergies  DISCHARGE MEDICATIONS:   Allergies as of 09/19/2018   No Known Allergies     Medication List    TAKE these medications   levETIRAcetam 500 MG tablet Commonly known as:  KEPPRA Take 1 tablet (500 mg total) by mouth 2 (two) times daily.        DISCHARGE INSTRUCTIONS:    Follow with neurologist in clinic.  If you experience worsening of your admission symptoms, develop shortness of breath, life threatening emergency, suicidal or homicidal thoughts you must seek medical attention immediately by calling 911 or calling your MD immediately  if symptoms less severe.  You Must read complete instructions/literature along with all the  possible adverse reactions/side effects for all the Medicines you take and that have been prescribed to you. Take any new Medicines after you have completely understood and accept all the possible adverse reactions/side effects.   Please note  You were cared for by a hospitalist during your hospital stay. If you have any questions about your discharge medications or the care you received while you were in the hospital after you are discharged, you can call the unit and asked to speak with the hospitalist on call if the hospitalist that took care of you is not available. Once you are discharged, your primary care physician will handle any further medical issues. Please note that NO REFILLS for any discharge medications will be authorized once you are discharged, as it is imperative that you return to your primary care physician (or establish a relationship with a primary care physician if you do not have one) for your aftercare needs so that they can reassess your need for medications and monitor your lab values.    Today   CHIEF COMPLAINT:   Chief Complaint  Patient presents with  . Seizures    HISTORY OF PRESENT ILLNESS:  Jill [name] Puleo  is a 33 y.o. female with a known history of recent hospital discharge a couple days ago for acute seizure with known history of epilepsy, discharged on Keppra, returns with recurrent seizure, patient given increased dose of IV Keppra, ER work-up noted for urine drug screen for benzos/try cyclic's, CT head negative, UA negative, patient  valuated in the emergency room, family at bedside, patient in no apparent distress, patient is now been admitted for acute recurrent breakthrough seizure.   VITAL SIGNS:  Blood pressure 93/60, pulse 92, temperature 98.6 F (37 C), temperature source Oral, resp. rate 20, height 5\' 3"  (1.6 m), weight 91.2 kg, last menstrual period 09/02/2018, SpO2 99 %.  I/O:    Intake/Output Summary (Last 24 hours) at 09/19/2018 1536 Last  data filed at 09/19/2018 0900 Gross per 24 hour  Intake 477 ml  Output -  Net 477 ml    PHYSICAL EXAMINATION:  GENERAL:  33 y.o.-year-old patient lying in the bed with no acute distress.  EYES: Pupils equal, round, reactive to light and accommodation. No scleral icterus. Extraocular muscles intact.  HEENT: Head atraumatic, normocephalic. Oropharynx and nasopharynx clear.  NECK:  Supple, no jugular venous distention. No thyroid enlargement, no tenderness.  LUNGS: Normal breath sounds bilaterally, no wheezing, rales,rhonchi or crepitation. No use of accessory muscles of respiration.  CARDIOVASCULAR: S1, S2 normal. No murmurs, rubs, or gallops.  ABDOMEN: Soft, non-tender, non-distended. Bowel sounds present. No organomegaly or mass.  EXTREMITIES: No pedal edema, cyanosis, or clubbing.  NEUROLOGIC: Cranial nerves II through XII are intact. Muscle strength 5/5 in all extremities. Sensation intact. Gait not checked.  PSYCHIATRIC: The patient is alert and oriented x 3.  SKIN: No obvious rash, lesion, or ulcer.   DATA REVIEW:   CBC Recent Labs  Lab 09/14/18 0423  WBC 7.7  HGB 9.7*  HCT 30.6*  PLT 283    Chemistries  Recent Labs  Lab 09/13/18 2226  09/18/18 1500  NA 137   < > 139  K 3.6   < > 4.1  CL 107   < > 105  CO2 23   < > 27  GLUCOSE 104*   < > 93  BUN 11   < > 14  CREATININE 0.90   < > 1.08*  CALCIUM 9.1   < > 9.1  AST 16  --   --   ALT 12  --   --   ALKPHOS 69  --   --   BILITOT 0.4  --   --    < > = values in this interval not displayed.    Cardiac Enzymes No results for input(s): TROPONINI in the last 168 hours.  Microbiology Results  Results for orders placed or performed during the hospital encounter of 09/13/18  Urine Culture     Status: Abnormal   Collection Time: 09/14/18  7:24 AM  Result Value Ref Range Status   Specimen Description   Final    URINE, RANDOM Performed at [facility], [address] [place], [place] [address]    Special  Requests   Final    NONE Performed at [facility], [address] [place], [place] [address]    Culture >=100,000 COLONIES/mL ESCHERICHIA COLI (A)  Final   Report Status 09/16/2018 FINAL  Final   Organism ID, Bacteria ESCHERICHIA COLI (A)  Final      Susceptibility   Escherichia coli - MIC*    AMPICILLIN >=32 RESISTANT Resistant     CEFAZOLIN 16 SENSITIVE Sensitive     CEFTRIAXONE <=1 SENSITIVE Sensitive     CIPROFLOXACIN >=4 RESISTANT Resistant     GENTAMICIN >=16 RESISTANT Resistant     IMIPENEM <=0.25 SENSITIVE Sensitive     NITROFURANTOIN <=16 SENSITIVE Sensitive     TRIMETH/SULFA >=320 RESISTANT Resistant     AMPICILLIN/SULBACTAM >=32  RESISTANT Resistant     PIP/TAZO 64 INTERMEDIATE Intermediate     Extended ESBL NEGATIVE Sensitive     * >=100,000 COLONIES/mL ESCHERICHIA COLI    RADIOLOGY:  Ct Head W Or Wo Contrast  Result Date: 09/18/2018 CLINICAL DATA:  New onset seizure. EXAM: CT HEAD WITHOUT AND WITH CONTRAST TECHNIQUE: Contiguous axial images were obtained from the base of the skull through the vertex without and with intravenous contrast CONTRAST:  75mL OMNIPAQUE IOHEXOL 300 MG/ML  SOLN COMPARISON:  04/14/2017 FINDINGS: Brain: There is no mass, hemorrhage or extra-axial collection. The size and configuration of the ventricles and extra-axial CSF spaces are normal. The brain parenchyma is normal, without evidence of acute or chronic infarction. No abnormal enhancement. Vascular: No abnormal hyperdensity of the major intracranial arteries or dural venous sinuses. No intracranial atherosclerosis. Skull: The visualized skull base, calvarium and extracranial soft tissues are normal. Sinuses/Orbits: No fluid levels or advanced mucosal thickening of the visualized paranasal sinuses. No mastoid or middle ear effusion. The orbits are normal. IMPRESSION: Normal CT of the brain with and without contrast. Electronically Signed   By: Deatra RobinsonKevin  Herman M.D.   On: 09/18/2018 17:08     EKG:   Orders placed or performed during the hospital encounter of 09/13/18  . ED EKG  . ED EKG  . EKG 12-Lead  . EKG 12-Lead      Management plans discussed with the patient, family and they are in agreement.  CODE STATUS:     Code Status Orders  (From admission, onward)         Start     Ordered   09/18/18 2244  Full code  Continuous     09/18/18 2243        Code Status History    Date Active Date Inactive Code Status Order ID Comments User Context   09/14/2018 0147 09/14/2018 1900 Full Code [id number]  Montez MoritaPatel, Jitendra, MD Inpatient   10/07/2016 0619 10/08/2016 0203 Full Code [id number]  Schermerhorn, Ihor Austinhomas J, MD Inpatient   10/07/2016 0550 10/07/2016 0619 Full Code [id number]  Angelia Mouldivera, Evelyn N, RN Inpatient   06/29/2016 1350 06/29/2016 2014 Full Code [id number]  Sharee PimpleJones, Caron W, CNM Inpatient      TOTAL TIME TAKING CARE OF THIS PATIENT: 35 minutes.    Altamese DillingVaibhavkumar Bernerd Terhune M.D on 09/19/2018 at 3:36 PM  Between 7am to 6pm - Pager - [phone]  After 6pm go to [email or web address] - Social research officer, governmentpassword EPAS [facility]  [facility] [place] [facility]  Office  [phone]  CC: Primary care physician; [facility]   Note: This dictation was prepared with Nurse, children'sDragon dictation along with smaller phrase technology. Any transcriptional errors that result from this process are unintentional.

## 2018-09-19 NOTE — Telephone Encounter (Signed)
Pt called no answer LM that we have a 3pm appointment open tomorrow and was wondering if she could come in at 3pm instead of 4:30pm. Pt was advised if she was unable to make the appointment at 3pm that she could keep her 4:30pm and if she could come in at 3pm to please call and let someone know so changes can be made to her appointment.

## 2018-09-20 ENCOUNTER — Encounter: Payer: Medicaid Other | Admitting: Obstetrics and Gynecology

## 2018-09-21 LAB — MISCELLANEOUS TEST

## 2018-09-22 ENCOUNTER — Ambulatory Visit (INDEPENDENT_AMBULATORY_CARE_PROVIDER_SITE_OTHER): Payer: Medicaid Other | Admitting: Obstetrics and Gynecology

## 2018-09-22 ENCOUNTER — Other Ambulatory Visit: Payer: Self-pay | Admitting: Obstetrics and Gynecology

## 2018-09-22 ENCOUNTER — Encounter: Payer: Self-pay | Admitting: Obstetrics and Gynecology

## 2018-09-22 VITALS — BP 92/61 | HR 102 | Ht 63.0 in | Wt 203.4 lb

## 2018-09-22 DIAGNOSIS — K5909 Other constipation: Secondary | ICD-10-CM

## 2018-09-22 DIAGNOSIS — Z8759 Personal history of other complications of pregnancy, childbirth and the puerperium: Secondary | ICD-10-CM

## 2018-09-22 DIAGNOSIS — D649 Anemia, unspecified: Secondary | ICD-10-CM

## 2018-09-22 DIAGNOSIS — O00102 Left tubal pregnancy without intrauterine pregnancy: Secondary | ICD-10-CM

## 2018-09-22 LAB — BETA HCG QUANT (REF LAB): hCG Quant: 1958 m[IU]/mL

## 2018-09-22 MED ORDER — HYDROCODONE-ACETAMINOPHEN 5-325 MG PO TABS: 1.0000 | ORAL_TABLET | Freq: Four times a day (QID) | ORAL | 0 refills | Status: DC | PRN

## 2018-09-22 MED ORDER — MAGNESIUM CITRATE PO SOLN: 1.0000 | Freq: Once | ORAL | 0 refills | Status: AC

## 2018-09-22 NOTE — Progress Notes (Signed)
Please inform patient that her hormone levels are trending appropriately.  She will need to follow up once a week for a hormone level check (lab visit) until it goes back down to normal.  She should wait until her hormone levels are negative before considering conceiving again.

## 2018-09-22 NOTE — Progress Notes (Addendum)
GYNECOLOGY PROGRESS NOTE  Subjective:    Patient ID: Jill [name], female    DOB: [birth year], 33 y.o.   MRN: [id number]  HPI  Patient is a 33 y.o. [id number] female who presents for 1 week follow up of ER visit where she was diagnosed with a left ectopic pregnancy and received treatment with Methotrexate. Of notes, she has a history of ruptured right ectopic pregnancy in August 2018 s/p salpingectomy. Today patient reports that she is having 10/10 pain (in abdomen and lower back). Of note, she was admitted in the hospital several days ago due seizure activity.  She noted pain at that time, however notes that when she was discharged she did not receive a prescription for something for pain. She additionally is still noting some constiaption. She is taking stool softeners which has helped to soften the stools but still has to strain and feels like she may have to wait at least 10 minutes until she is able to have a bowel movement after sitting on the commode.    Past Medical History:  Diagnosis Date  . Anemia   . Headache   . Seizures (HCC)   ,  Family History  Problem Relation Age of Onset  . Healthy Mother   . Healthy Father   . Cancer Neg Hx   . Thyroid disease Neg Hx   . Diabetes Neg Hx     Past Surgical History:  Procedure Laterality Date  . CESAREAN SECTION     x4  . CESAREAN SECTION [name]/A 10/07/2016   Procedure: CESAREAN SECTION;  Surgeon: Suzy Bouchard, MD;  Location: [facility] ORS;  Service: Obstetrics;  Laterality: [name]/A;    Social History   Socioeconomic History  . Marital status: Single    Spouse name: Not on file  . Number of children: 5  . Years of education: 55  . Highest education level: Not on file  Occupational History  . Not on file  Social Needs  . Financial resource strain: Not hard at all  . Food insecurity:    Worry: Never true    Inability: Never true  . Transportation needs:    Medical: No    Non-medical: No  Tobacco Use  . Smoking  status: Former Games developer  . Smokeless tobacco: Never Used  Substance and Sexual Activity  . Alcohol use: No  . Drug use: No  . Sexual activity: Yes  Lifestyle  . Physical activity:    Days per week: 0 days    Minutes per session: 0 min  . Stress: Not at all  Relationships  . Social connections:    Talks on phone: Once a week    Gets together: Once a week    Attends religious service: Never    Active member of club or organization: No    Attends meetings of clubs or organizations: Never    Relationship status: Never married  . Intimate partner violence:    Fear of current or ex partner: No    Emotionally abused: No    Physically abused: No    Forced sexual activity: No  Other Topics Concern  . Not on file  Social History Narrative   Lives in a one story home with her 5 children.  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  Education: high school.      Current Outpatient Medications on File Prior to Visit  Medication Sig Dispense Refill  . levETIRAcetam (KEPPRA) 500 MG tablet Take 1 tablet (500  mg total) by mouth 2 (two) times daily. 60 tablet 1   No current facility-administered medications on file prior to visit.     No Known Allergies   Review of Systems Pertinent items noted in HPI and remainder of comprehensive ROS otherwise negative.   Objective:   Blood pressure 92/61, pulse (!) 102, height 5\' 3"  (1.6 m), last menstrual period 09/02/2018. General appearance: alert and no distress Abdomen: soft, non-tender; bowel sounds normal; no masses,  no organomegaly Pelvic: deferred Back: symmetric, no curvature. ROM normal. No CVA tenderness.   Labs:  Results for Jill [name], Jill [name] (MRN [id number]) as of 09/22/2018 23:11  Ref. Range 09/12/2018 09:27 09/15/2018 15:31 09/18/2018 15:00  HCG, Beta Chain, Quant, S Latest Ref Range: <5 mIU/mL 4,648 (H)  5,556 (H)  hCG Quant Latest Units: mIU/mL  3,626    Lab Results  Component Value Date   WBC 7.7 09/14/2018   HGB 9.7 (L) 09/14/2018   HCT  30.6 (L) 09/14/2018   MCV 85.7 09/14/2018   PLT 283 09/14/2018    Lab Results  Component Value Date   ABORH O POS 05/18/2016    Assessment:   Left ectopic pregnancy s/p methotrexate therapy H/o prior ectopic pregnancy Pelvic pain Constipation Anemia  Plan:   - Patient currently Day #9 s/p methotrexate treatment (missed appointment 2 days ago).  Will need to draw repeat BHCG levels to follow trend. Will need weekly BHCG levels until negative.  - Currently noting 10/10 pain, however physically does not appear to be in any distress and abdominal exam was unimpressive (no rebound or guarding). Unable to take Ultram as previously prescribed due to increase in seizure activity. Will prescribe Vicodin.  - Constipation still noted (was noted ~ 1 week ago when seen in ER as well).  Is taking a stool softener and Miralax with only mild relief. May also be cause of her pelvic and back pain. Discussed use of magnesium citrate (will prescribe), increasing fiber and water intake.  - Anemia noted on previous labs with trend downward.  Patient currently asymptomatic. Will need to rule out internal hemorrhage.  Will order ultrasound.    Hildred Laserherry, Davinci Glotfelty, MD [facility]

## 2018-09-22 NOTE — Patient Instructions (Signed)
Constipation, Adult Constipation is when a person has fewer bowel movements in a week than normal, has difficulty having a bowel movement, or has stools that are dry, hard, or larger than normal. Constipation may be caused by an underlying condition. It may become worse with age if a person takes certain medicines and does not take in enough fluids. Follow these instructions at home: Eating and drinking   Eat foods that have a lot of fiber, such as fresh fruits and vegetables, whole grains, and beans.  Limit foods that are high in fat, low in fiber, or overly processed, such as french fries, hamburgers, cookies, candies, and soda.  Drink enough fluid to keep your urine clear or pale yellow. General instructions  Exercise regularly or as told by your health care provider.  Go to the restroom when you have the urge to go. Do not hold it in.  Take over-the-counter and prescription medicines only as told by your health care provider. These include any fiber supplements.  Practice pelvic floor retraining exercises, such as deep breathing while relaxing the lower abdomen and pelvic floor relaxation during bowel movements.  Watch your condition for any changes.  Keep all follow-up visits as told by your health care provider. This is important. Contact a health care provider if:  You have pain that gets worse.  You have a fever.  You do not have a bowel movement after 4 days.  You vomit.  You are not hungry.  You lose weight.  You are bleeding from the anus.  You have thin, pencil-like stools. Get help right away if:  You have a fever and your symptoms suddenly get worse.  You leak stool or have blood in your stool.  Your abdomen is bloated.  You have severe pain in your abdomen.  You feel dizzy or you faint. This information is not intended to replace advice given to you by your health care provider. Make sure you discuss any questions you have with your health care  provider. Document Released: 05/28/2004 Document Revised: 03/19/2016 Document Reviewed: 02/18/2016 Elsevier Interactive Patient Education  2019 [employer]. Ectopic Pregnancy  An ectopic pregnancy happens when a fertilized egg grows outside the womb (uterus). The fertilized egg cannot stay alive outside of the womb. This problem often happens in a fallopian tube. It is often caused by damage to the tube. If this problem is found early, you may be treated with medicine that stops the egg from growing. If your tube tears or bursts open (ruptures), you will bleed inside. Often, there is very bad pain in the lower belly. This is an emergency. You will need surgery. Get help right away. Follow these instructions at home: After being treated with medicine or surgery:  Rest and limit your activity for as long as told by your doctor.  Until your doctor says that it is safe: ? Do not lift anything that is heavier than 10 lb (4.5 kg) or the limit that your doctor tells you. ? Avoid exercise and any movement that takes a lot of effort.  To prevent problems when pooping (constipation): ? Eat a healthy diet. This includes:  Fruits.  Vegetables.  Whole grains. ? Drink 6-8 glasses of water a day. Contact a doctor if: Get help right away if:  You have sudden and very bad pain in your belly.  You have very bad pain in your shoulders or neck.  You have pain that gets worse and is not helped by medicine.  You have: ? A fever or chills. ? Vaginal bleeding. ? Redness or swelling at the site of a surgical cut (incision).  You feel sick to your stomach (nauseous) or you throw up (vomit).  You feel dizzy or weak.  You feel light-headed or you pass out (faint). Summary  An ectopic pregnancy happens when a fertilized egg grows outside the womb (uterus).  If this problem is found early, you may be treated with medicine that stops the egg from growing.  If your tube tears or bursts open  (ruptures), you will need surgery. This is an emergency. Get help right away. This information is not intended to replace advice given to you by your health care provider. Make sure you discuss any questions you have with your health care provider. Document Released: 11/26/2008 Document Revised: 09/23/2016 Document Reviewed: 09/23/2016 Elsevier Interactive Patient Education  2019 [employer]

## 2018-09-22 NOTE — Progress Notes (Signed)
Pt is present today due to having a ectopic pregnancy. Pt stated that she is having pain in the lower abd and back area. Pt stated that her pain was a level 10. No other complaints.

## 2018-09-23 DIAGNOSIS — Z8759 Personal history of other complications of pregnancy, childbirth and the puerperium: Secondary | ICD-10-CM | POA: Insufficient documentation

## 2018-09-25 ENCOUNTER — Telehealth: Payer: Self-pay | Admitting: Obstetrics and Gynecology

## 2018-09-25 NOTE — Telephone Encounter (Signed)
The patient called and stated that she would like to get an update on the status of her results of her bloodwork. No other information was disclosed. Please advise.

## 2018-09-25 NOTE — Telephone Encounter (Signed)
Jill [name] has called the patient.

## 2018-09-29 ENCOUNTER — Other Ambulatory Visit: Payer: Medicaid Other

## 2018-10-02 ENCOUNTER — Other Ambulatory Visit: Payer: Medicaid Other

## 2018-10-02 ENCOUNTER — Other Ambulatory Visit: Payer: Self-pay | Admitting: Obstetrics and Gynecology

## 2018-10-02 DIAGNOSIS — Z8759 Personal history of other complications of pregnancy, childbirth and the puerperium: Secondary | ICD-10-CM

## 2018-10-03 ENCOUNTER — Other Ambulatory Visit: Payer: Self-pay | Admitting: Obstetrics and Gynecology

## 2018-10-03 ENCOUNTER — Ambulatory Visit (INDEPENDENT_AMBULATORY_CARE_PROVIDER_SITE_OTHER): Payer: Medicaid Other

## 2018-10-03 ENCOUNTER — Encounter: Payer: Self-pay | Admitting: Obstetrics and Gynecology

## 2018-10-03 ENCOUNTER — Ambulatory Visit (INDEPENDENT_AMBULATORY_CARE_PROVIDER_SITE_OTHER): Payer: Medicaid Other | Admitting: Obstetrics and Gynecology

## 2018-10-03 VITALS — BP 144/105 | HR 92 | Ht 63.0 in | Wt 206.4 lb

## 2018-10-03 DIAGNOSIS — Z8759 Personal history of other complications of pregnancy, childbirth and the puerperium: Secondary | ICD-10-CM

## 2018-10-03 DIAGNOSIS — O00102 Left tubal pregnancy without intrauterine pregnancy: Secondary | ICD-10-CM

## 2018-10-03 DIAGNOSIS — R102 Pelvic and perineal pain: Secondary | ICD-10-CM

## 2018-10-03 LAB — BETA HCG QUANT (REF LAB): hCG Quant: 676 m[IU]/mL

## 2018-10-03 MED ORDER — OXYCODONE-ACETAMINOPHEN 5-325 MG PO TABS: 1.0000 | ORAL_TABLET | Freq: Four times a day (QID) | ORAL | 0 refills | Status: DC | PRN

## 2018-10-03 MED ORDER — ONDANSETRON HCL 4 MG PO TABS: 4.0000 mg | ORAL_TABLET | Freq: Three times a day (TID) | ORAL | 0 refills | Status: DC | PRN

## 2018-10-03 NOTE — Progress Notes (Signed)
Pt is present today following an u/s due to having pelvic/abd pain. Pt stated that she is taking the Vicodin but it is not helping.

## 2018-10-05 NOTE — Progress Notes (Signed)
GYNECOLOGY PROGRESS NOTE   Subjective:    Patient ID: Jill [name], female    DOB: [birth year], 33 y.o.   MRN: [id number]  HPI  Patient is a 33 y.o. [id number] female who presents for complaints of continued abdominal pain.  She is currently approximately 3 weeks status post methotrexate therapy for a right ectopic pregnancy after presenting to the emergency room with complaints of abdominal pain and abnormal bleeding.  Patient at last visit was given Vicodin for her pain, however patient notes that the pain continues to persist.  Also is noting some nausea and vomiting for the past several days when taking the medication.  She desires to know if this is normal.  Of note, patient states that she has had issues recently with 1 of her smaller children accidentally kicking her in her abdomen as she was holding him.  She also cites an incident several days ago where she was having difficulty getting out of the tub and fell, hitting her head.  The following portions of the patient's history were reviewed and updated as appropriate: allergies, current medications, past family history, past medical history, past social history, past surgical history and problem list.  Review of Systems Pertinent items noted in HPI and remainder of comprehensive ROS otherwise negative.   Objective:   Blood pressure (!) 144/105, pulse 92, height 5\' 3"  (1.6 m), weight 206 lb 6.4 oz (93.6 kg), last menstrual period 09/02/2018. General appearance: alert and no distress Abdomen: normal findings: bowel sounds normal and no masses palpable and abnormal findings:  obese and mild tenderness in the lower abdomen Pelvic: deferred   Labs Results for Jill [name] (MRN [id number]) as of 10/05/2018 00:36  Ref. Range 09/15/2018 15:31 09/18/2018 15:00 09/22/2018 11:24 10/02/2018 09:48  HCG, Beta Chain, Quant, S Latest Ref Range: <5 mIU/mL  5,556 (H)    hCG Quant Latest Units: mIU/mL 3,626  1,958 676     Imaging:    CLINICAL DATA:  Pelvic pain for 1 day. The patient has a history of ectopic pregnancy in August of 2019.  EXAM: OBSTETRIC <14 WK [place] AND TRANSVAGINAL OB US  TECHNIQUE: Both transabdominal and transvaginal ultrasound examinations were performed for complete evaluation of the gestation as well as the maternal uterus, adnexal regions, and pelvic cul-de-sac. Transvaginal technique was performed to assess early pregnancy.  COMPARISON:  None.  FINDINGS: Intrauterine gestational sac: None  Yolk sac:  Not Visualized.  Embryo:  Not Visualized.  Cardiac Activity: Not Visualized.  Heart Rate: Does not apply bpm  MSD:   mm    w     d  CRL:    mm    w    d                  [place] EDC:  Subchorionic hemorrhage:  None visualized.  Maternal uterus/adnexae: In the left adnexa, there is a 5.7 mm cystic area. An ectopic pregnancy is not excluded. The right ovary is normal.  IMPRESSION: No intrauterine gestational sac is identified.  In the left adnexa, there is a 5.7 mm cystic area, an ectopic pregnancy is not excluded.   Electronically Signed   By: Sherian Rein M.D.   On: 09/12/2018 15:40   US Transvaginal Non-OB Patient Name: Jill [name] DOB: 1986-08-18 MRN: [id number]  ULTRASOUND REPORT  Location: [facility]  Date of Service: 10/03/2018   Indications:Pelvic Pain Findings:  The uterus is retroverted and measures 7.9x4.7x5.6cm. Echo texture is heterogenous with  evidence of focal masses. Subserosal  pedunculated hypoechoic mass is seen = 6.4x6.4cm. (possibility  of fibroid vs others)  The Endometrium measures 9.4 mm.  Right Ovary measures 6.8 cm3. It is normal in appearance. Left Ovary not seen Survey of the adnexa demonstrates no adnexal masses. There is a trace free fluid in the posterior cul de sac.  Impression: 1. Subserosal  pedunculated hypoechoic mass is seen = 6.4x6.4cm.  (possibility of fibroid vs others) 2. NO  intra or extra  uterine gestational sac is seen   Recommendations: 1.Clinical correlation with the patient's History and Physical Exam.  Jill [name], RDMS  On further review of this ultrasound, the hypoechoic mass appears to be  originating from the left adenexa.  To correlate with patient's history of  recent ectopic pregnancy and physical exam.   Jill Laser, MD [facility]   Assessment:   33 y.o. with right ectopic pregnancy, status post methotrexate therapy Pelvic pain  Plan.:    - Ultrasound results reviewed with patient.  Ultrasound noting a slightly larger hypoechoic mass on left adnexa. Question concerning for enlarging fallopian tube and potential for rupture, despite downward trending labs (bHCGs)..  -We will prescribe Percocet for pain instead of the Vicodin.  Is unclear whether patient's nausea and vomiting comes from taking the Vicodin as a reaction or whether or not patient is truly having symptoms beyond what is currently being given.  Patient also given a prescription for Zofran. -Patient advised that if her symptoms do not improve within 24 to 48 hours, and that she will need to return and we need to discuss surgical management.  We had a lengthy discussion regarding if surgical intervention was recommended that she could potentially lose her second fallopian tube, hence making her sterile.  Patient noted that she initially did want to preserve fertility, however at this time she is more concerned about her pain.  If she is required to have surgery, she will understand.   A total of 15 minutes were spent face-to-face with the patient during this encounter and over half of that time dealt with counseling and coordination of care.    Jill Laser, MD [facility]

## 2018-10-07 ENCOUNTER — Emergency Department: Payer: Medicaid Other | Admitting: Anesthesiology

## 2018-10-07 ENCOUNTER — Encounter
Admission: EM | Disposition: A | Discharge status: Home / Self Care | Payer: Self-pay | Source: Home / Self Care | Attending: Specialist

## 2018-10-07 ENCOUNTER — Other Ambulatory Visit: Payer: Self-pay

## 2018-10-07 ENCOUNTER — Inpatient Hospital Stay
Admission: EM | Admit: 2018-10-07 | Discharge: 2018-10-10 | DRG: 819 | Disposition: A | Discharge status: Home / Self Care | Payer: Medicaid Other | Attending: Specialist | Admitting: Specialist

## 2018-10-07 ENCOUNTER — Encounter: Payer: Self-pay | Admitting: Emergency Medicine

## 2018-10-07 ENCOUNTER — Emergency Department: Payer: Medicaid Other

## 2018-10-07 DIAGNOSIS — R569 Unspecified convulsions: Secondary | ICD-10-CM

## 2018-10-07 DIAGNOSIS — Z87891 Personal history of nicotine dependence: Secondary | ICD-10-CM

## 2018-10-07 DIAGNOSIS — O00102 Left tubal pregnancy without intrauterine pregnancy: Principal | ICD-10-CM | POA: Diagnosis present

## 2018-10-07 DIAGNOSIS — R102 Pelvic and perineal pain: Secondary | ICD-10-CM

## 2018-10-07 DIAGNOSIS — O009 Unspecified ectopic pregnancy without intrauterine pregnancy: Secondary | ICD-10-CM | POA: Diagnosis present

## 2018-10-07 DIAGNOSIS — N189 Chronic kidney disease, unspecified: Secondary | ICD-10-CM | POA: Diagnosis present

## 2018-10-07 DIAGNOSIS — G40901 Epilepsy, unspecified, not intractable, with status epilepticus: Secondary | ICD-10-CM | POA: Diagnosis present

## 2018-10-07 DIAGNOSIS — N736 Female pelvic peritoneal adhesions (postinfective): Secondary | ICD-10-CM | POA: Diagnosis present

## 2018-10-07 DIAGNOSIS — N181 Chronic kidney disease, stage 1: Secondary | ICD-10-CM | POA: Diagnosis present

## 2018-10-07 HISTORY — PX: DIAGNOSTIC LAPAROSCOPY WITH REMOVAL OF ECTOPIC PREGNANCY: SHX6449

## 2018-10-07 LAB — CBC
HCT: 33.3 % — ABNORMAL LOW (ref 36.0–46.0)
Hemoglobin: 10.4 g/dL — ABNORMAL LOW (ref 12.0–15.0)
MCH: 26.5 pg (ref 26.0–34.0)
MCHC: 31.2 g/dL (ref 30.0–36.0)
MCV: 84.9 fL (ref 80.0–100.0)
Platelets: 344 10*3/uL (ref 150–400)
RBC: 3.92 MIL/uL (ref 3.87–5.11)
RDW: 13.6 % (ref 11.5–15.5)
WBC: 7.7 10*3/uL (ref 4.0–10.5)
nRBC: 0 % (ref 0.0–0.2)

## 2018-10-07 LAB — COMPREHENSIVE METABOLIC PANEL
ALT: 11 U/L (ref 0–44)
AST: 15 U/L (ref 15–41)
Albumin: 4.5 g/dL (ref 3.5–5.0)
Alkaline Phosphatase: 66 U/L (ref 38–126)
Anion gap: 6 (ref 5–15)
BUN: 10 mg/dL (ref 6–20)
CO2: 27 mmol/L (ref 22–32)
Calcium: 9.1 mg/dL (ref 8.9–10.3)
Chloride: 107 mmol/L (ref 98–111)
Creatinine, Ser: 1.04 mg/dL — ABNORMAL HIGH (ref 0.44–1.00)
GFR calc Af Amer: >60 mL/min (ref >60)
GFR calc non Af Amer: >60 mL/min (ref >60)
Glucose, Bld: 90 mg/dL (ref 70–99)
Potassium: 3.6 mmol/L (ref 3.5–5.1)
Sodium: 140 mmol/L (ref 135–145)
Total Bilirubin: 0.6 mg/dL (ref 0.3–1.2)
Total Protein: 7.6 g/dL (ref 6.5–8.1)

## 2018-10-07 LAB — URINALYSIS, COMPLETE (UACMP) WITH MICROSCOPIC
Bilirubin Urine: NEGATIVE
Glucose, UA: NEGATIVE mg/dL
Ketones, ur: 20 mg/dL — AB
Leukocytes, UA: NEGATIVE
Nitrite: POSITIVE — AB
Protein, ur: 100 mg/dL — AB
RBC / HPF: >50 RBC/hpf — ABNORMAL HIGH (ref 0–5)
Specific Gravity, Urine: 1.027 (ref 1.005–1.030)
pH: 5 (ref 5.0–8.0)

## 2018-10-07 LAB — LACTIC ACID, PLASMA: Lactic Acid, Venous: 0.8 mmol/L (ref 0.5–1.9)

## 2018-10-07 LAB — POCT PREGNANCY, URINE: Preg Test, Ur: POSITIVE — AB

## 2018-10-07 LAB — HCG, QUANTITATIVE, PREGNANCY: hCG, Beta Chain, Quant, S: 329 m[IU]/mL — ABNORMAL HIGH (ref <5)

## 2018-10-07 LAB — LIPASE, BLOOD: Lipase: 23 U/L (ref 11–51)

## 2018-10-07 SURGERY — LAPAROSCOPY, WITH ECTOPIC PREGNANCY SURGICAL TREATMENT
Anesthesia: General | Laterality: Left

## 2018-10-07 MED ORDER — CEFAZOLIN SODIUM-DEXTROSE 1-4 GM/50ML-% IV SOLN
INTRAVENOUS | Status: DC | PRN
  Administered 2018-10-07: 2 g via INTRAVENOUS

## 2018-10-07 MED ORDER — OXYCODONE-ACETAMINOPHEN 5-325 MG PO TABS: 1.0000 | ORAL_TABLET | Freq: Four times a day (QID) | ORAL | Status: DC | PRN

## 2018-10-07 MED ORDER — DEXMEDETOMIDINE HCL IN NACL 200 MCG/50ML IV SOLN
INTRAVENOUS | Status: AC
  Filled 2018-10-07: qty 50

## 2018-10-07 MED ORDER — DEXAMETHASONE SODIUM PHOSPHATE 10 MG/ML IJ SOLN
INTRAMUSCULAR | Status: DC | PRN
  Administered 2018-10-07: 10 mg via INTRAVENOUS

## 2018-10-07 MED ORDER — PROMETHAZINE HCL 25 MG/ML IJ SOLN: 6.2500 mg | INTRAMUSCULAR | Status: DC | PRN

## 2018-10-07 MED ORDER — MAGNESIUM CITRATE PO SOLN
1.0000 | Freq: Once | ORAL | Status: DC | PRN
  Filled 2018-10-07: qty 296

## 2018-10-07 MED ORDER — PHENYLEPHRINE HCL 10 MG/ML IJ SOLN
INTRAMUSCULAR | Status: DC | PRN
  Administered 2018-10-07 (×2): 200 ug via INTRAVENOUS

## 2018-10-07 MED ORDER — SUCCINYLCHOLINE CHLORIDE 20 MG/ML IJ SOLN
INTRAMUSCULAR | Status: DC | PRN
  Administered 2018-10-07: 100 mg via INTRAVENOUS

## 2018-10-07 MED ORDER — ONDANSETRON HCL 4 MG/2ML IJ SOLN
INTRAMUSCULAR | Status: AC
  Filled 2018-10-07: qty 2

## 2018-10-07 MED ORDER — LORAZEPAM 2 MG/ML IJ SOLN
INTRAMUSCULAR | Status: AC
  Administered 2018-10-07: 1 mg
  Filled 2018-10-07: qty 1

## 2018-10-07 MED ORDER — SUGAMMADEX SODIUM 200 MG/2ML IV SOLN
INTRAVENOUS | Status: DC | PRN
  Administered 2018-10-07: 200 mg via INTRAVENOUS

## 2018-10-07 MED ORDER — DEXMEDETOMIDINE HCL 200 MCG/2ML IV SOLN
INTRAVENOUS | Status: DC | PRN
  Administered 2018-10-07: 8 ug via INTRAVENOUS
  Administered 2018-10-07: 12 ug via INTRAVENOUS

## 2018-10-07 MED ORDER — LACTATED RINGERS IV SOLN
INTRAVENOUS | Status: DC | PRN
  Administered 2018-10-07 (×2): via INTRAVENOUS

## 2018-10-07 MED ORDER — LIDOCAINE HCL URETHRAL/MUCOSAL 2 % EX GEL
CUTANEOUS | Status: AC
  Filled 2018-10-07: qty 5

## 2018-10-07 MED ORDER — LEVETIRACETAM IN NACL 1000 MG/100ML IV SOLN
1000.0000 mg | Freq: Once | INTRAVENOUS | Status: AC
  Administered 2018-10-07: 1000 mg via INTRAVENOUS
  Filled 2018-10-07: qty 100

## 2018-10-07 MED ORDER — DOCUSATE SODIUM 100 MG PO CAPS
100.0000 mg | ORAL_CAPSULE | Freq: Two times a day (BID) | ORAL | Status: DC
  Administered 2018-10-08 – 2018-10-10 (×4): 100 mg via ORAL
  Filled 2018-10-07 (×4): qty 1

## 2018-10-07 MED ORDER — MIDAZOLAM HCL 2 MG/2ML IJ SOLN
INTRAMUSCULAR | Status: DC | PRN
  Administered 2018-10-07: 2 mg via INTRAVENOUS

## 2018-10-07 MED ORDER — KETOROLAC TROMETHAMINE 30 MG/ML IJ SOLN
30.0000 mg | Freq: Once | INTRAMUSCULAR | Status: AC
  Administered 2018-10-07: 30 mg via INTRAVENOUS
  Filled 2018-10-07: qty 1

## 2018-10-07 MED ORDER — MORPHINE SULFATE (PF) 2 MG/ML IV SOLN: 1.0000 mg | INTRAVENOUS | Status: DC | PRN

## 2018-10-07 MED ORDER — FENTANYL CITRATE (PF) 100 MCG/2ML IJ SOLN
INTRAMUSCULAR | Status: DC | PRN
  Administered 2018-10-07: 100 ug via INTRAVENOUS
  Administered 2018-10-07: 50 ug via INTRAVENOUS
  Administered 2018-10-07: 100 ug via INTRAVENOUS

## 2018-10-07 MED ORDER — SENNOSIDES-DOCUSATE SODIUM 8.6-50 MG PO TABS: 1.0000 | ORAL_TABLET | Freq: Every evening | ORAL | Status: DC | PRN

## 2018-10-07 MED ORDER — LEVETIRACETAM 500 MG PO TABS
500.0000 mg | ORAL_TABLET | Freq: Two times a day (BID) | ORAL | Status: DC
  Administered 2018-10-07: 500 mg via ORAL
  Filled 2018-10-07 (×2): qty 1

## 2018-10-07 MED ORDER — OXYCODONE HCL 5 MG PO TABS
10.0000 mg | ORAL_TABLET | Freq: Four times a day (QID) | ORAL | Status: DC | PRN
  Administered 2018-10-07 – 2018-10-08 (×2): 10 mg via ORAL
  Filled 2018-10-07 (×3): qty 2

## 2018-10-07 MED ORDER — FENTANYL CITRATE (PF) 250 MCG/5ML IJ SOLN
INTRAMUSCULAR | Status: AC
  Filled 2018-10-07: qty 5

## 2018-10-07 MED ORDER — SUGAMMADEX SODIUM 200 MG/2ML IV SOLN
INTRAVENOUS | Status: AC
  Filled 2018-10-07: qty 2

## 2018-10-07 MED ORDER — FENTANYL CITRATE (PF) 100 MCG/2ML IJ SOLN
25.0000 ug | INTRAMUSCULAR | Status: DC | PRN
  Administered 2018-10-07: 50 ug via INTRAVENOUS

## 2018-10-07 MED ORDER — ROCURONIUM BROMIDE 100 MG/10ML IV SOLN
INTRAVENOUS | Status: DC | PRN
  Administered 2018-10-07: 30 mg via INTRAVENOUS
  Administered 2018-10-07: 20 mg via INTRAVENOUS

## 2018-10-07 MED ORDER — BISACODYL 10 MG RE SUPP: 10.0000 mg | Freq: Every day | RECTAL | Status: DC | PRN

## 2018-10-07 MED ORDER — ZOLPIDEM TARTRATE 5 MG PO TABS: 5.0000 mg | ORAL_TABLET | Freq: Every evening | ORAL | Status: DC | PRN

## 2018-10-07 MED ORDER — ALUM & MAG HYDROXIDE-SIMETH 200-200-20 MG/5ML PO SUSP: 30.0000 mL | ORAL | Status: DC | PRN

## 2018-10-07 MED ORDER — SUCCINYLCHOLINE CHLORIDE 20 MG/ML IJ SOLN
INTRAMUSCULAR | Status: AC
  Filled 2018-10-07: qty 1

## 2018-10-07 MED ORDER — ROCURONIUM BROMIDE 50 MG/5ML IV SOLN
INTRAVENOUS | Status: AC
  Filled 2018-10-07: qty 1

## 2018-10-07 MED ORDER — MORPHINE SULFATE (PF) 2 MG/ML IV SOLN
2.0000 mg | Freq: Once | INTRAVENOUS | Status: AC
  Administered 2018-10-07: 2 mg via INTRAVENOUS
  Filled 2018-10-07: qty 1

## 2018-10-07 MED ORDER — LEVETIRACETAM 500 MG PO TABS
500.0000 mg | ORAL_TABLET | Freq: Two times a day (BID) | ORAL | Status: DC
  Administered 2018-10-08 (×2): 500 mg via ORAL
  Filled 2018-10-07 (×2): qty 1

## 2018-10-07 MED ORDER — MORPHINE SULFATE (PF) 2 MG/ML IV SOLN: 2.0000 mg | Freq: Once | INTRAVENOUS | Status: DC

## 2018-10-07 MED ORDER — FENTANYL CITRATE (PF) 100 MCG/2ML IJ SOLN
INTRAMUSCULAR | Status: AC
  Filled 2018-10-07: qty 2

## 2018-10-07 MED ORDER — LORAZEPAM 2 MG/ML IJ SOLN
INTRAMUSCULAR | Status: AC
  Administered 2018-10-07: 2 mg
  Filled 2018-10-07: qty 1

## 2018-10-07 MED ORDER — BUPIVACAINE HCL 0.5 % IJ SOLN
INTRAMUSCULAR | Status: DC | PRN
  Administered 2018-10-07: 14 mL

## 2018-10-07 MED ORDER — SIMETHICONE 80 MG PO CHEW
80.0000 mg | CHEWABLE_TABLET | Freq: Four times a day (QID) | ORAL | Status: DC | PRN
  Administered 2018-10-07: 80 mg via ORAL
  Filled 2018-10-07 (×2): qty 1

## 2018-10-07 MED ORDER — METHYLENE BLUE 0.5 % INJ SOLN
INTRAVENOUS | Status: AC
  Filled 2018-10-07: qty 10

## 2018-10-07 MED ORDER — ONDANSETRON HCL 4 MG/2ML IJ SOLN
4.0000 mg | Freq: Four times a day (QID) | INTRAMUSCULAR | Status: DC | PRN
  Administered 2018-10-07 – 2018-10-10 (×3): 4 mg via INTRAVENOUS
  Filled 2018-10-07 (×3): qty 2

## 2018-10-07 MED ORDER — OXYCODONE HCL 5 MG PO TABS
5.0000 mg | ORAL_TABLET | Freq: Four times a day (QID) | ORAL | Status: DC | PRN
  Administered 2018-10-10: 5 mg via ORAL
  Filled 2018-10-07: qty 1

## 2018-10-07 MED ORDER — PHENYLEPHRINE HCL 10 MG/ML IJ SOLN
INTRAMUSCULAR | Status: AC
  Filled 2018-10-07: qty 1

## 2018-10-07 MED ORDER — KETOROLAC TROMETHAMINE 30 MG/ML IJ SOLN
30.0000 mg | Freq: Four times a day (QID) | INTRAMUSCULAR | Status: DC
  Administered 2018-10-07: 30 mg via INTRAMUSCULAR
  Filled 2018-10-07: qty 1

## 2018-10-07 MED ORDER — LIDOCAINE HCL (PF) 2 % IJ SOLN
INTRAMUSCULAR | Status: AC
  Filled 2018-10-07: qty 10

## 2018-10-07 MED ORDER — LACTATED RINGERS IV SOLN
INTRAVENOUS | Status: DC
  Administered 2018-10-07 – 2018-10-08 (×3): via INTRAVENOUS

## 2018-10-07 MED ORDER — ACETAMINOPHEN 500 MG PO TABS
1000.0000 mg | ORAL_TABLET | Freq: Four times a day (QID) | ORAL | Status: DC
  Administered 2018-10-07 – 2018-10-08 (×5): 1000 mg via ORAL
  Filled 2018-10-07 (×5): qty 2

## 2018-10-07 MED ORDER — PROPOFOL 10 MG/ML IV BOLUS
INTRAVENOUS | Status: AC
  Filled 2018-10-07: qty 20

## 2018-10-07 MED ORDER — PANTOPRAZOLE SODIUM 40 MG PO TBEC
40.0000 mg | DELAYED_RELEASE_TABLET | Freq: Every day | ORAL | Status: DC
  Administered 2018-10-07 – 2018-10-10 (×4): 40 mg via ORAL
  Filled 2018-10-07 (×4): qty 1

## 2018-10-07 MED ORDER — DEXAMETHASONE SODIUM PHOSPHATE 10 MG/ML IJ SOLN
INTRAMUSCULAR | Status: AC
  Filled 2018-10-07: qty 1

## 2018-10-07 MED ORDER — ONDANSETRON HCL 4 MG/2ML IJ SOLN
INTRAMUSCULAR | Status: DC | PRN
  Administered 2018-10-07: 4 mg via INTRAVENOUS

## 2018-10-07 MED ORDER — MIDAZOLAM HCL 2 MG/2ML IJ SOLN
INTRAMUSCULAR | Status: AC
  Filled 2018-10-07: qty 2

## 2018-10-07 MED ORDER — LIDOCAINE HCL (CARDIAC) PF 100 MG/5ML IV SOSY
PREFILLED_SYRINGE | INTRAVENOUS | Status: DC | PRN
  Administered 2018-10-07: 100 mg via INTRAVENOUS

## 2018-10-07 MED ORDER — PROPOFOL 10 MG/ML IV BOLUS
INTRAVENOUS | Status: DC | PRN
  Administered 2018-10-07: 200 mg via INTRAVENOUS

## 2018-10-07 MED ORDER — ONDANSETRON HCL 4 MG PO TABS
4.0000 mg | ORAL_TABLET | Freq: Four times a day (QID) | ORAL | Status: DC | PRN
  Administered 2018-10-08: 4 mg via ORAL
  Filled 2018-10-07: qty 1

## 2018-10-07 MED ORDER — KETOROLAC TROMETHAMINE 30 MG/ML IJ SOLN
30.0000 mg | Freq: Four times a day (QID) | INTRAMUSCULAR | Status: DC
  Administered 2018-10-07 – 2018-10-08 (×2): 30 mg via INTRAVENOUS
  Filled 2018-10-07 (×2): qty 1

## 2018-10-07 MED ORDER — CEFAZOLIN SODIUM 1 G IJ SOLR
INTRAMUSCULAR | Status: AC
  Filled 2018-10-07: qty 20

## 2018-10-07 MED ORDER — MENTHOL 3 MG MT LOZG
1.0000 | LOZENGE | OROMUCOSAL | Status: DC | PRN
  Filled 2018-10-07: qty 9

## 2018-10-07 MED ORDER — BUPIVACAINE HCL (PF) 0.5 % IJ SOLN
INTRAMUSCULAR | Status: AC
  Filled 2018-10-07: qty 30

## 2018-10-07 SURGICAL SUPPLY — 41 items
BAG URINE DRAINAGE (UROLOGICAL SUPPLIES) ×3 IMPLANT
BLADE SURG SZ11 CARB STEEL (BLADE) ×3 IMPLANT
CANISTER SUCT 1200ML W/VALVE (MISCELLANEOUS) ×3 IMPLANT
CATH FOL 2WAY LX 16X5 (CATHETERS) ×3 IMPLANT
CATH ROBINSON RED A/P 16FR (CATHETERS) ×3 IMPLANT
CHLORAPREP W/TINT 26ML (MISCELLANEOUS) ×3 IMPLANT
CORD MONOPOLAR M/FML 12FT (MISCELLANEOUS) IMPLANT
COVER WAND RF STERILE (DRAPES) IMPLANT
DERMABOND ADVANCED (GAUZE/BANDAGES/DRESSINGS) ×2
DERMABOND ADVANCED .7 DNX12 (GAUZE/BANDAGES/DRESSINGS) ×1 IMPLANT
GLOVE BIO SURGEON STRL SZ 6.5 (GLOVE) ×12 IMPLANT
GLOVE BIO SURGEON STRL SZ8 (GLOVE) IMPLANT
GLOVE BIO SURGEONS STRL SZ 6.5 (GLOVE) ×6
GLOVE INDICATOR 7.0 STRL GRN (GLOVE) ×3 IMPLANT
GLOVE INDICATOR 8.0 STRL GRN (GLOVE) IMPLANT
GOWN STRL REUS W/ TWL LRG LVL3 (GOWN DISPOSABLE) ×2 IMPLANT
GOWN STRL REUS W/TWL LRG LVL3 (GOWN DISPOSABLE) ×4
GOWN STRL REUS W/TWL XL LVL4 (GOWN DISPOSABLE) IMPLANT
GRASPER SUT TROCAR 14GX15 (MISCELLANEOUS) ×3 IMPLANT
HEMOSTAT ARISTA ABSORB 1G (HEMOSTASIS) ×3 IMPLANT
IRRIGATION STRYKERFLOW (MISCELLANEOUS) ×1 IMPLANT
IRRIGATOR STRYKERFLOW (MISCELLANEOUS) ×3
IV LACTATED RINGERS 1000ML (IV SOLUTION) ×3 IMPLANT
KIT PINK PAD W/HEAD ARE REST (MISCELLANEOUS) ×3
KIT PINK PAD W/HEAD ARM REST (MISCELLANEOUS) ×1 IMPLANT
KIT TURNOVER CYSTO (KITS) ×3 IMPLANT
NS IRRIG 500ML POUR BTL (IV SOLUTION) ×3 IMPLANT
PACK GYN LAPAROSCOPIC (MISCELLANEOUS) ×3 IMPLANT
PAD OB MATERNITY 4.3X12.25 (PERSONAL CARE ITEMS) ×3 IMPLANT
PAD PREP 24X41 OB/GYN DISP (PERSONAL CARE ITEMS) ×3 IMPLANT
POUCH ENDO CATCH 10MM SPEC (MISCELLANEOUS) IMPLANT
SCISSORS METZENBAUM CVD 33 (INSTRUMENTS) ×3 IMPLANT
SHEARS HARMONIC ACE PLUS 36CM (ENDOMECHANICALS) IMPLANT
SLEEVE ENDOPATH XCEL 5M (ENDOMECHANICALS) ×3 IMPLANT
SUT VIC AB 3-0 SH 27 (SUTURE)
SUT VIC AB 3-0 SH 27X BRD (SUTURE) IMPLANT
SUT VICRYL 0 AB UR-6 (SUTURE) ×3 IMPLANT
TROCAR ENDO BLADELESS 11MM (ENDOMECHANICALS) ×3 IMPLANT
TROCAR XCEL NON-BLD 5MMX100MML (ENDOMECHANICALS) ×3 IMPLANT
TROCAR XCEL UNIV SLVE 11M 100M (ENDOMECHANICALS) ×3 IMPLANT
TUBING INSUFFLATION (TUBING) ×3 IMPLANT

## 2018-10-07 NOTE — Anesthesia Preprocedure Evaluation (Signed)
Anesthesia Evaluation    History of Anesthesia Complications Negative for: history of anesthetic complications  Airway Mallampati: III       Dental  (+) Dental Advidsory Given, Teeth Intact, Missing   Pulmonary neg pulmonary ROS, former smoker,           Cardiovascular Exercise Tolerance: Good negative cardio ROS       Neuro/Psych  Headaches, Seizures -, Poorly Controlled,     GI/Hepatic Neg liver ROS, GERD  ,  Endo/Other  negative endocrine ROS  Renal/GU CRFRenal disease     Musculoskeletal   Abdominal   Peds  Hematology  (+) Blood dyscrasia, anemia ,   Anesthesia Other Findings Past Medical History: No date: Anemia No date: Headache No date: Seizures (HCC)   Reproductive/Obstetrics (+) Pregnancy                             Anesthesia Physical Anesthesia Plan  ASA: II and emergent  Anesthesia Plan: General   Post-op Pain Management:    Induction: Intravenous, Rapid sequence and Cricoid pressure planned  PONV Risk Score and Plan: 3 and Ondansetron, Dexamethasone, Midazolam, Promethazine and Treatment may vary due to age or medical condition  Airway Management Planned: Oral ETT  Additional Equipment:   Intra-op Plan:   Post-operative Plan: Extubation in OR  Informed Consent:   Plan Discussed with:   Anesthesia Plan Comments:         Anesthesia Quick Evaluation

## 2018-10-07 NOTE — ED Triage Notes (Signed)
Patient c/o LLQ abdominal pain. Patient denies emesis, diarrhea, and urinary symptoms. Patient c/ nausea.

## 2018-10-07 NOTE — Anesthesia Postprocedure Evaluation (Signed)
Anesthesia Post Note  Patient: Jill [name]  Procedure(s) Performed: laparoscopic left salpingectomy (Left )  Patient location during evaluation: PACU Anesthesia Type: General Level of consciousness: awake and alert Pain management: pain level controlled Vital Signs Assessment: post-procedure vital signs reviewed and stable Respiratory status: spontaneous breathing, nonlabored ventilation, respiratory function stable and patient connected to nasal cannula oxygen Cardiovascular status: blood pressure returned to baseline and stable Postop Assessment: no apparent nausea or vomiting Anesthetic complications: no     Last Vitals:  Vitals:   10/07/18 1315 10/07/18 1549  BP: 101/65 107/72  Pulse: 88 89  Resp: 18 20  Temp: 36.6 C 36.6 C  SpO2: 100% 99%    Last Pain:  Vitals:   10/07/18 1715  TempSrc:   PainSc: 10-Worst pain ever                 Lenard Simmer

## 2018-10-07 NOTE — Anesthesia Procedure Notes (Signed)
Procedure Name: Intubation Date/Time: 10/07/2018 8:24 AM Performed by: Clinton Sawyer, CRNA Pre-anesthesia Checklist: Patient identified, Patient being monitored, Timeout performed, Emergency Drugs available and Suction available Patient Re-evaluated:Patient Re-evaluated prior to induction Oxygen Delivery Method: Circle system utilized Preoxygenation: Pre-oxygenation with 100% oxygen Induction Type: IV induction Laryngoscope Size: Mac and 4 Grade View: Grade I Tube type: Oral Tube size: 7.0 mm Number of attempts: 1 Airway Equipment and Method: Stylet Placement Confirmation: ETT inserted through vocal cords under direct vision,  positive ETCO2 and breath sounds checked- equal and bilateral Secured at: 21 cm Tube secured with: Tape Dental Injury: Teeth and Oropharynx as per pre-operative assessment

## 2018-10-07 NOTE — Progress Notes (Signed)
Ch responded to a rapid response code in 348. Several care team members were present. Pt was actively having a seizure upon ch arrival. Medical team provided the needed meds for the pt. Pt was responsive and shared w/ provider that she missed the dose of her seizure meds. Ch left nurse at bedside to monitor pt. F/u recommended.     10/07/18 1900  Clinical Encounter Type  Visited With Patient;Health care provider  Visit Type Critical Care;Code  Referral From Nurse  Consult/Referral To None  Spiritual Encounters  Spiritual Needs Grief support  Stress Factors  Patient Stress Factors Exhausted;Health changes  Family Stress Factors None identified

## 2018-10-07 NOTE — ED Triage Notes (Signed)
Pt arrives via [facility] with c/o abdominal pain and vaginal bleeding. Per their report, the pt has hx of tubal pregnancy in December and has had vaginal bleeding since then. Increased bleeding today. Lower abdominal pain. Emesis-has family members in the home. En route, vitals were 116, 129/75, 99.5, 98%, c/a/o.

## 2018-10-07 NOTE — Significant Event (Signed)
Rapid Response Event Note  Overview: Time Called: 1834 Arrival Time: 1840 Event Type: Neurologic  Initial Focused Assessment: called for RR on Pt on [facility] unit experiencing seizure activity. Pt laying in bed, able to answer questions, continued tremorring in right arm and bilateral legs. VSS   Interventions: Dr Valentino Saxon to bedside to assess pt, will call Hospitalist to consult. Pt admits to skipping seizure medication.  Plan of Care (if not transferred): Mother Baby RN to call if further assistance needed.  Event Summary: Name of Physician Notified: Cherry at 1844    at    Outcome: Stayed in room and stabalized  Event End Time: 1855  Jill [name] A

## 2018-10-07 NOTE — Op Note (Addendum)
Procedure(s): laparoscopic left salpingectomy, adhesiolysis Procedure Note  MAIRIN BROWER female 33 y.o. 10/07/2018  Indications: The patient is a 33 y.o. [phone] female with history of right ectopic pregnancy s/p right salpingectomy in 04/2018, now with ruptured left ectopic pregnancy s/p methotrexate therapy  Pre-operative Diagnosis: Ruptured left ectopic pregnancy s/p methotrexate therapy, with history of right ectopic pregnancy s/p right salpingectomy  Post-operative Diagnosis: Same, with extensive pelvic adhesions  Surgeon: Hildred Laser, MD  Assistants:  No other capable assistant available in surgery requiring a high level assistant.   Anesthesia: General endotracheal anesthesia  Findings: No hemoperitoneum visualized.  The uterus was noted to extremely retroflexed.  Perforation of the uterus occurred with the uterine manipulator.   Dense bladder adhesions to the lower uterine segment were present.  Dense adhesions of the bowel to the left pelvic sidewall and pelvic mass (ruptured left ectopic). Thin filmy adhesions of the right ovary to the posterior uterine surface was present.  Surgically absent right fallopian tube.  Ruptured proximal left fallopian tube with distal end containing ectopic pregnancy Normal appearing left ovary.  Procedure Details: The patient was seen in the Holding Room. The risks, benefits, complications, treatment options, and expected outcomes were discussed with the patient.  The patient concurred with the proposed plan, giving informed consent.  The site of surgery properly noted/marked. The patient was taken to the Operating Room, identified as Earney Hamburg and the procedure verified as Procedure(s) (LRB): laparoscopic left salpingectomy (Left). A Time Out was held and the above information confirmed.  She was then placed under general anesthesia without difficulty. She was placed in the dorsal lithotomy position, and was prepped and draped  in a sterile manner.  A straight catheterization was performed. A sterile speculum was inserted into the vagina and the cervix was grasped at the anterior lip using a single-toothed tenaculum.  The uterus was soundedand a Hulka clamp was placed for uterine manipulation.  The speculum and tenaculum were then removed. After an adequate timeout was performed, attention was turned to the abdomen where an umbilical incision was made with the scalpel.  The Optiview 5-mm trocar and sleeve were then advanced without difficulty with the laparoscope under direct visualization into the abdomen.  The abdomen was then insufflated with carbon dioxide gas and adequate pneumoperitoneum was obtained. A 11-mm left lower quadrant port and a 5-mm right lower quadrant port were then placed under direct visualization.  A survey of the patient's pelvis and abdomen revealed the findings as above.    Due to distortion of pelvic anatomy, the bladder was filled with ~ 120 cc of diluted methylene blue dye. Adhesions of the bowel to the pelvic mass were reduced using blunt dissection and the Harmonic scalpel.  Adhesions of right ovary to the posterior surface of the uterus were taken down using blunt dissection.  The uterus was more visible, and it was noted that a perforation had occurred due to the exaggerated retroflexed position of the uterus.  The Hulka clamp was repositioned under visualization. Once further dissection had been made, the left distal fallopian tube containing the ectopic was freed from it's mesosalpinx attachments and placed in the posterior cul-de-sac.  The ruptured proximal portion of the tube was then freed from the mesosalpinx in several shredded fragments until the entire tube was freed.  An Endocatch bag was then inserted into the 11 mm trochar and the left tube and ectopic were removed. Irrigation was performed and any areas of slight oozing were cauterized with  the Kleppinger monopolar device.  Arista was placed  over the operative site. Hemostasis was achieved.     A final survey was performed, where good hemostasis was noted throughout.  A PMI device with cone was used to close the 11-mm incision site with a 0-Vicryl.   All other trocars were removed under direct visualization, and the abdomen which was desufflated.  The bladder was drained of the methylene blue dye.  All skin incisions were closed with 4-0 Monocryl subcuticular stitches. Incisions were injected with a total of 14 cc of 0.5% Sensorcaine. The patient tolerated the procedures well.  The patient was given antibiotics (2 g Ancef IV x 1 dose) due to perforation).  All instruments, needles, and sponge counts were correct x 2. The patient was taken to the recovery room awake, extubated and in stable condition.    Greater than 50% of 2 hour case was spent on adhesiolysis.   Estimated Blood Loss:  50 ml      Drains: straight catheterization prior to procedure with  150 ml of clear urine         Total IV Fluids:  1000 ml  Specimens: Left ruptured ectopic pregnancy and fallopian tube         Implants: None         Complications:  None; patient tolerated the procedure well.         Disposition: PACU - hemodynamically stable.         Condition: stable   Hildred Laser, MD [facility]

## 2018-10-07 NOTE — ED Provider Notes (Signed)
[name] Provider Note   First [name] Initiated Contact with Patient 10/07/18 [phone]     (approximate)  I have reviewed the triage vital signs and the nursing notes.   HISTORY  Chief Complaint Abdominal Pain   HPI [name] is a 33 y.o. female with below list of chronic medical conditions including recently diagnosed left ectopic pregnancy for which she was treated with methotrexate returns to the emergency department with worsening left abdominal/pelvic discomfort.  Patient states that she saw [name] earlier this week with ultrasound revealing "swelling of her left tube".  Patient states current pain score is 8 out of 10.  Patient denies any fever.  Patient denies any nausea or vomiting   Past Medical History:  Diagnosis Date  . Anemia   . Headache   . Seizures [name])     Patient Active Problem List   Diagnosis Date Noted  . Ruptured ectopic pregnancy 10/07/2018  . History of ectopic pregnancy 09/23/2018  . Seizure ([name]) 09/14/2018  . Chronic kidney disease   . Episode of loss of consciousness     Past Surgical History:  Procedure Laterality Date  . CESAREAN SECTION     x4  . CESAREAN SECTION N/A 10/07/2016   Procedure: CESAREAN SECTION;  Surgeon: Jill Bouchard, [name];  Location: [name];  Service: Obstetrics;  Laterality: N/A;    Prior to Admission medications   Medication Sig Start Date End Date Taking? Authorizing Provider  levETIRAcetam (KEPPRA) 500 MG tablet Take 1 tablet (500 mg total) by mouth 2 (two) times daily. 09/14/18 11/13/18 Yes [name], Jill Pancake, [name]  ondansetron (ZOFRAN) 4 MG tablet Take 1 tablet (4 mg total) by mouth every 8 (eight) hours as needed for nausea or vomiting. 10/03/18  Yes Jill Laser, [name]  oxyCODONE-acetaminophen (PERCOCET/ROXICET) 5-325 MG tablet Take 1-2 tablets by mouth every 6 (six) hours as needed. 10/03/18  Yes Jill Laser, [name]    Allergies Patient has no known  allergies.  Family History  Problem Relation Age of Onset  . Healthy Mother   . Healthy Father   . Cancer Neg Hx   . Thyroid disease Neg Hx   . Diabetes Neg Hx     Social History Social History   Tobacco Use  . Smoking status: Former [name]  . Smokeless tobacco: Never Used  Substance Use Topics  . Alcohol use: No  . Drug use: No    Review of Systems Constitutional: No fever/chills Eyes: No visual changes. ENT: No sore throat. Cardiovascular: Denies chest pain. Respiratory: Denies shortness of breath. Gastrointestinal: Positive for left pelvic/abdominal discomfort..  No nausea, no vomiting.  No diarrhea.  No constipation. Genitourinary: Negative for dysuria. Musculoskeletal: Negative for neck pain.  Negative for back pain. Integumentary: Negative for rash. Neurological: Negative for headaches, focal weakness or numbness.   ____________________________________________   PHYSICAL EXAM:  VITAL SIGNS: ED Triage Vitals  Enc Vitals Group     BP 10/07/18 0138 116/74     Pulse Rate 10/07/18 0138 (!) 115     Resp 10/07/18 0138 20     Temp 10/07/18 0138 98.3 F (36.8 C)     Temp Source 10/07/18 0138 Oral     SpO2 10/07/18 0138 100 %     Weight 10/07/18 0139 94 kg (207 lb 3.7 oz)     Height --      Head Circumference --      Peak Flow --  Pain Score 10/07/18 0139 10     Pain Loc --      Pain Edu? --      Excl. in GC? --     Constitutional: Alert and oriented. Well appearing and in no acute distress. Eyes: Conjunctivae are normal.  Mouth/Throat: Mucous membranes are moist.  Oropharynx non-erythematous. Neck: No stridor.   Cardiovascular: Normal rate, regular rhythm. Good peripheral circulation. Grossly normal heart sounds. Respiratory: Normal respiratory effort.  No retractions. Lungs CTAB. Gastrointestinal: Pain with palpation left upper and lower quadrant.  No guarding no rebound no distention. Musculoskeletal: No lower extremity tenderness nor edema. No  gross deformities of extremities. Neurologic:  Normal speech and language. No gross focal neurologic deficits are appreciated.  Skin:  Skin is warm, dry and intact. No rash noted. Psychiatric: Mood and affect are normal. Speech and behavior are normal.  ____________________________________________   LABS (all labs ordered are listed, but only abnormal results are displayed)  Labs Reviewed  COMPREHENSIVE METABOLIC PANEL - Abnormal; Notable for the following components:      Result Value   Creatinine, Ser 1.04 (*)    All other components within normal limits  CBC - Abnormal; Notable for the following components:   Hemoglobin 10.4 (*)    HCT 33.3 (*)    All other components within normal limits  URINALYSIS, COMPLETE (UACMP) WITH MICROSCOPIC - Abnormal; Notable for the following components:   Color, Urine AMBER (*)    APPearance CLOUDY (*)    Hgb urine dipstick LARGE (*)    Ketones, ur 20 (*)    Protein, ur 100 (*)    Nitrite POSITIVE (*)    RBC / HPF >50 (*)    Bacteria, UA MANY (*)    All other components within normal limits  HCG, QUANTITATIVE, PREGNANCY - Abnormal; Notable for the following components:   hCG, Beta Chain, Quant, S 329 (*)    All other components within normal limits  POCT PREGNANCY, URINE - Abnormal; Notable for the following components:   Preg Test, Ur POSITIVE (*)    All other components within normal limits  LIPASE, BLOOD  LACTIC ACID, PLASMA  POC URINE PREG, ED  SURGICAL PATHOLOGY   ______  RADIOLOGY I, [name], personally viewed and evaluated these images (plain radiographs) as part of my medical decision making, as well as reviewing the written report by the radiologist.  ED [name] interpretation: Ultrasound revealed complex 6 6 solid-appearing mass in the left adnexa measuring 7.8 x 5.6 x 5.6 cm almost certainly sequelae of a ruptured ectopic pregnancy per the radiologist.  Official radiology report(s): [place] Ob Less Than 14 Weeks With Ob  Transvaginal  Result Date: 10/07/2018 CLINICAL DATA:  LEFT lower quadrant abdominal pain and vaginal bleeding. History of ectopic pregnancy in December. Per technologist notes, patient given methotrexate on 09/22/2018. EXAM: OBSTETRIC <14 WK US AND TRANSVAGINAL OB US TECHNIQUE: Both transabdominal and transvaginal ultrasound examinations were performed for complete evaluation of the gestation as well as the maternal uterus, adnexal regions, and pelvic cul-de-sac. Transvaginal technique was performed to assess early pregnancy. COMPARISON:  None. FINDINGS: No intrauterine pregnancy identified. uterus/adnexae: Endometrial complex is normal in thickness. Small RIGHT ovarian cyst. No mass or free fluid identified in the RIGHT adnexal region. Complex cystic and solid-appearing mass in the LEFT adnexal region measures 7.8 x 5.6 x 5.6 cm. LEFT ovary is not seen separate from this mass. IMPRESSION: 1. Complex cystic and solid-appearing mass in the LEFT adnexal region, measuring 7.8  x 5.6 x 5.6 cm, almost certainly sequela of ruptured ectopic pregnancy given patient's provided history of recent ectopic. 2. No intrauterine pregnancy. Critical Value/emergent results were called by telephone at the time of interpretation on 10/07/2018 at 6:25 am to Dr. Bayard MalesANDOLPH Anayia Eugene , who verbally acknowledged these results. Electronically Signed   By: Bary RichardStan  Maynard M.D.   On: 10/07/2018 06:25       Procedures   ____________________________________________   INITIAL IMPRESSION / ASSESSMENT AND PLAN / ED COURSE  As part of my medical decision making, I reviewed the following data within the electronic MEDICAL RECORD NUMBER  33 year old female presenting with above-stated history and physical exam concerning for possible ruptured left ectopic pregnancy and as such ultrasound was performed which revealed findings consistent with possible left ectopic pregnancy rupture.  Patient was given IV morphine with pain improvement. Patient  discussed with Dr. Valentino Saxonherry OB/GYN after discussing the ultrasound with the radiologist.  Dr. Valentino Saxonherry promptly presented to the emergency department with plan to take the patient to the operating room. ____________________________________________  FINAL CLINICAL IMPRESSION(S) / ED DIAGNOSES  Ruptured left ectopic pregnancy  MEDICATIONS GIVEN DURING THIS VISIT:  Medications  lactated ringers infusion ( Intravenous Stopped 10/07/18 1715)  zolpidem (AMBIEN) tablet 5 mg (has no administration in time range)  docusate sodium (COLACE) capsule 100 mg (100 mg Oral Not Given 10/07/18 1348)  senna-docusate (Senokot-S) tablet 1 tablet (has no administration in time range)  bisacodyl (DULCOLAX) suppository 10 mg (has no administration in time range)  magnesium citrate solution 1 Bottle (has no administration in time range)  alum & mag hydroxide-simeth (MAALOX/MYLANTA) 200-200-20 MG/5ML suspension 30 mL (has no administration in time range)  simethicone (MYLICON) chewable tablet 80 mg (80 mg Oral Given 10/07/18 1358)  ondansetron (ZOFRAN) tablet 4 mg ( Oral See Alternative 10/07/18 1933)    Or  ondansetron (ZOFRAN) injection 4 mg (4 mg Intravenous Given 10/07/18 1933)  pantoprazole (PROTONIX) EC tablet 40 mg (40 mg Oral Given 10/07/18 1343)  menthol-cetylpyridinium (CEPACOL) lozenge 3 mg (has no administration in time range)  acetaminophen (TYLENOL) tablet 1,000 mg (1,000 mg Oral Given 10/07/18 1806)  morphine 2 MG/ML injection 1-2 mg (has no administration in time range)  ketorolac (TORADOL) 30 MG/ML injection 30 mg (30 mg Intravenous Given 10/07/18 1806)  oxyCODONE (Oxy IR/ROXICODONE) immediate release tablet 5 mg (has no administration in time range)  oxyCODONE (Oxy IR/ROXICODONE) immediate release tablet 10 mg (10 mg Oral Given 10/07/18 1715)  levETIRAcetam (KEPPRA) tablet 500 mg (has no administration in time range)  ketorolac (TORADOL) 30 MG/ML injection 30 mg (30 mg Intravenous Given 10/07/18 0537)    morphine 2 MG/ML injection 2 mg (2 mg Intravenous Given 10/07/18 0636)  fentaNYL (SUBLIMAZE) 100 MCG/2ML injection (  Duplicate 10/07/18 1237)  LORazepam (ATIVAN) 2 MG/ML injection (1 mg  Given 10/07/18 1848)  LORazepam (ATIVAN) 2 MG/ML injection (1 mg  Given 10/07/18 1853)  LORazepam (ATIVAN) 2 MG/ML injection (2 mg  Given 10/07/18 1855)  levETIRAcetam (KEPPRA) IVPB 1000 mg/100 mL premix ( Intravenous Stopped 10/07/18 1952)     ED Discharge Orders    None       Note:  This document was prepared using Dragon voice recognition software and may include unintentional dictation errors.    Darci CurrentBrown, [place], [name] 10/09/18 2233

## 2018-10-07 NOTE — Progress Notes (Signed)
Interval progress note:   Notified by nurse that patient began to experience seizure-like activity beginning at approximately 15 minutes ago.  Had a seizure in the bathrooomwhile attempting to use the restroom.  Nurse noted a second seizure as they attempted to move her to the bed.  Patient has a history of seizures.  Was treated with Keppra PO home dose after surgery for ectopic earlier today.  Patient treated with Ativan 4 mg as status epilepticus noted after ~ 10-12 minutes after seizures. Patient noted to respond to dosing. I presented to assess the patient. Vital signs stable.  Patient able to respond to her name, respond to commands of hand squeezing, and is aware of location (hospital). On questioning, patient reports that she did not take her seizure medication yesterday, and then had been in the ER most of this morning prior to having surgery and did not have her morning dose either. Contacted Hospitalist on call, recommends to give patient an additional 1 gram of Keppra and will round in a.m. Do not recommend any additional monitoring at this time. Continue to monitor closely.    Hildred Laser, MD [facility]

## 2018-10-07 NOTE — Anesthesia Post-op Follow-up Note (Signed)
Anesthesia QCDR form completed.        

## 2018-10-07 NOTE — Progress Notes (Signed)
Called into patients room around 1830 by Melissa NT who was assisting patient in the bathroom. Upon walking in bathroom pt was diaphoretic, dizzy, and pale- ammonia inhalent used but pt stated "my hand is twitching I feel a seizure coming." Pt transported from bathroom by wheelchair back to bed safely. Bed rails lifted and ensured rails padded, suction on. Rapid response called and Dr. Valentino Saxon notified. Seizure activity started at 1846. See orders for Ativan IV (total of 4mg  given). VSS throughout event. Seizure activity from 1846-1900 (two 30 second blocks of time entire body was shaking, the rest of the time right arm and bilateral legs were shaking).   Dr. Valentino Saxon came to see patient at bedside around 1855. Consulted Hospitalist. See new orders. Report given to [facility] RN who will assume patient care for tonight.

## 2018-10-07 NOTE — Consult Note (Signed)
Reason for Consult: Suspected ruptured ectopic pregnancy Referring Physician: Dr. Manson Passey (ER physician)  Jill [name] is an 33 y.o. [phone] female who presented to the Emergency Room due to complaints of continued lower abdominal pain with nausea and vomiting.  Patient has a current left ectopic pregnancy, treated with Methotrexate on 09/12/2018, and also a history of prior right ectopic pregnancy s/p right salpingectomy in August 2019 performed at [facility] (along with umbilical hernia repair. Patient was seen in the office on Tuesday for f/u of ectopic pregnancy and complaints of continued pain. Ultrasound at that time noted a slight enlargement of the adnexal mass however patient otherwise appeared to be stable. Was given strict bleeding/pain precautions and instructed to f/u in 24-48 hours if no improvement in symptoms due to increasing concerns for rupture of ectopic. Today on CT scan, notable enlargement of left mass, with concerns of rupture. Patient notes nausea and vomiting 4 times last night and twice this morning.  Has not had any vomiting since being in the OR. Last meal was at 8 pm yesterday.    Pertinent Gynecological History: Menses: reported to be regular, flow is moderate to heavy.  Contraception: none Blood transfusions: none Sexually transmitted diseases: past history: trichomoniasis, diagnosed 04/2018, treated.     Menstrual History: Menarche age: 82 No LMP recorded.    OB History  Gravida Para Term Preterm AB Living  7 4 4   2 4   SAB TAB Ectopic Multiple Live Births      1   4    # Outcome Date GA Lbr Len/2nd Weight Sex Delivery Anes PTL Lv  7 Gravida           6 Ectopic 04/2018          5 Term 05/14/14    M CS-LTranv   LIV  4 Term 10/20/12    F CS-LTranv  [name] LIV  3 Term 11/27/07    F CS-LTranv  [name] LIV  2 Term 06/17/05    M CS-LTranv  [name] LIV  1 AB             Obstetric Comments  G6 - ruptured ectopic s/p left salpingectomy    Past Medical History:  Diagnosis Date   . Anemia   . Headache   . Seizures (HCC)     Past Surgical History:  Procedure Laterality Date  . CESAREAN SECTION     x4  . CESAREAN SECTION [name]/A 10/07/2016   Procedure: CESAREAN SECTION;  Surgeon: Suzy Bouchard, MD;  Location: [facility] ORS;  Service: Obstetrics;  Laterality: [name]/A;    Family History  Problem Relation Age of Onset  . Healthy Mother   . Healthy Father   . Cancer Neg Hx   . Thyroid disease Neg Hx   . Diabetes Neg Hx     Social History:  reports that she has quit smoking. She has never used smokeless tobacco. She reports that she does not drink alcohol or use drugs.  Allergies: No Known Allergies  Medications: I have reviewed the patient's current medications.  No current facility-administered medications on file prior to encounter.    Current Outpatient Medications on File Prior to Encounter  Medication Sig Dispense Refill  . levETIRAcetam (KEPPRA) 500 MG tablet Take 1 tablet (500 mg total) by mouth 2 (two) times daily. 60 tablet 1  . ondansetron (ZOFRAN) 4 MG tablet Take 1 tablet (4 mg total) by mouth every 8 (eight) hours as needed for nausea or vomiting. 20  tablet 0  . oxyCODONE-acetaminophen (PERCOCET/ROXICET) 5-325 MG tablet Take 1-2 tablets by mouth every 6 (six) hours as needed. 30 tablet 0  . HYDROcodone-acetaminophen (NORCO/VICODIN) 5-325 MG tablet Take 1-2 tablets by mouth every 6 (six) hours as needed for moderate pain or severe pain. (Patient not taking: Reported on 10/07/2018) 20 tablet 0     Review of Systems  Constitutional: Negative for chills, fever and weight loss.  HENT: Negative.   Eyes: Negative.   Respiratory: Negative for cough, sputum production and shortness of breath.   Cardiovascular: Negative for chest pain, palpitations and leg swelling.  Skin: Negative for itching and rash.  Neurological: Negative for dizziness, sensory change, weakness and headaches.  Endo/Heme/Allergies: Negative for environmental allergies. Does not  bruise/bleed easily.  Psychiatric/Behavioral: Negative.     Blood pressure 116/79, pulse (!) 109, temperature 98.3 F (36.8 C), temperature source Oral, resp. rate 18, weight 94 kg, SpO2 100 %, unknown if currently breastfeeding. Physical Exam  Constitutional: She is oriented to person, place, and time. She appears well-developed and well-nourished. No distress.  HENT:  Head: Normocephalic and atraumatic.  Mouth/Throat: No oropharyngeal exudate.  Eyes: Pupils are equal, round, and reactive to light. Conjunctivae and EOM are normal. Right eye exhibits no discharge. Left eye exhibits no discharge. No scleral icterus.  Neck: Normal range of motion. Neck supple. No tracheal deviation present. No thyromegaly present.  Cardiovascular: Normal rate, regular rhythm and normal heart sounds. Exam reveals no gallop and no friction rub.  No murmur heard. Respiratory: Effort normal and breath sounds normal. No respiratory distress. She has no wheezes.  GI: Soft. She exhibits no distension and no mass. There is abdominal tenderness. There is no rebound and no guarding.  + mild tenderness at umbilicus where umbilical hernia is present, reducible. Also mild tenderness in lower abdomen near midline.   Genitourinary:    Genitourinary Comments: Deferred, per ER physician exam   Musculoskeletal: Normal range of motion.        General: No tenderness, deformity or edema.  Lymphadenopathy:    She has no cervical adenopathy.  Neurological: She is alert and oriented to person, place, and time.  Skin: Skin is warm and dry. No rash noted. No erythema.  Psychiatric: She has a normal mood and affect. Her behavior is normal. Thought content normal.    Results for orders placed or performed during the hospital encounter of 10/07/18 (from the past 48 hour(s))  Urinalysis, Complete w Microscopic     Status: Abnormal   Collection Time: 10/07/18  1:41 AM  Result Value Ref Range   Color, Urine AMBER (A) YELLOW     Comment: BIOCHEMICALS MAY BE AFFECTED BY COLOR   APPearance CLOUDY (A) CLEAR   Specific Gravity, Urine 1.027 1.005 - 1.030   pH 5.0 5.0 - 8.0   Glucose, UA NEGATIVE NEGATIVE mg/dL   Hgb urine dipstick LARGE (A) NEGATIVE   Bilirubin Urine NEGATIVE NEGATIVE   Ketones, ur 20 (A) NEGATIVE mg/dL   Protein, ur 696 (A) NEGATIVE mg/dL   Nitrite POSITIVE (A) NEGATIVE   Leukocytes, UA NEGATIVE NEGATIVE   RBC / HPF >50 (H) 0 - 5 RBC/hpf   WBC, UA 6-10 0 - 5 WBC/hpf   Bacteria, UA MANY (A) NONE SEEN   Squamous Epithelial / LPF 11-20 0 - 5   Mucus PRESENT     Comment: Performed at [facility], [address] [place], [place] [address]  hCG, quantitative, pregnancy     Status: Abnormal  Collection Time: 10/07/18  1:41 AM  Result Value Ref Range   hCG, Beta Chain, Quant, S 329 (H) <5 mIU/mL    Comment:          GEST. AGE      CONC.  (mIU/mL)   <=1 WEEK        5 - 50     2 WEEKS       50 - 500     3 WEEKS       100 - 10,000     4 WEEKS     1,000 - 30,000     5 WEEKS     3,500 - 115,000   6-8 WEEKS     12,000 - 270,000    12 WEEKS     15,000 - 220,000        FEMALE AND NON-PREGNANT FEMALE:     LESS THAN 5 mIU/mL Performed at [facility], [address] [place], [place] [address]   Lipase, blood     Status: None   Collection Time: 10/07/18  1:43 AM  Result Value Ref Range   Lipase 23 11 - 51 U/L    Comment: Performed at [facility], [address] [place], [place] [address]  Comprehensive metabolic panel     Status: Abnormal   Collection Time: 10/07/18  1:43 AM  Result Value Ref Range   Sodium 140 135 - 145 mmol/L   Potassium 3.6 3.5 - 5.1 mmol/L   Chloride 107 98 - 111 mmol/L   CO2 27 22 - 32 mmol/L   Glucose, Bld 90 70 - 99 mg/dL   BUN 10 6 - 20 mg/dL   Creatinine, Ser 7.12 (H) 0.44 - 1.00 mg/dL   Calcium 9.1 8.9 - 45.8 mg/dL   Total Protein 7.6 6.5 - 8.1 g/dL   Albumin 4.5 3.5 - 5.0 g/dL   AST 15 15 - 41 U/L   ALT 11 0 - 44 U/L   Alkaline  Phosphatase 66 38 - 126 U/L   Total Bilirubin 0.6 0.3 - 1.2 mg/dL   GFR calc non Af Amer >60 >60 mL/min   GFR calc Af Amer >60 >60 mL/min   Anion gap 6 5 - 15    Comment: Performed at [facility], [address] [place], [place] [address]  CBC     Status: Abnormal   Collection Time: 10/07/18  1:43 AM  Result Value Ref Range   WBC 7.7 4.0 - 10.5 K/uL   RBC 3.92 3.87 - 5.11 MIL/uL   Hemoglobin 10.4 (L) 12.0 - 15.0 g/dL   HCT 38.2 (L) 50.5 - 39.7 %   MCV 84.9 80.0 - 100.0 fL   MCH 26.5 26.0 - 34.0 pg   MCHC 31.2 30.0 - 36.0 g/dL   RDW 67.3 41.9 - 37.9 %   Platelets 344 150 - 400 K/uL   nRBC 0.0 0.0 - 0.2 %    Comment: Performed at [facility], [address] [place], [place] [address]  Lactic acid, plasma     Status: None   Collection Time: 10/07/18  1:43 AM  Result Value Ref Range   Lactic Acid, Venous 0.8 0.5 - 1.9 mmol/L    Comment: Performed at [facility], [address] [place], [place] [address]  Pregnancy, urine POC     Status: Abnormal   Collection Time: 10/07/18  4:07 AM  Result Value Ref Range   Preg Test, Ur POSITIVE (A) NEGATIVE  Comment:        THE SENSITIVITY OF THIS METHODOLOGY IS >24 mIU/mL     Lab Results  Component Value Date   ABORH O POS 05/18/2016     Results for Jill [name], Jill [name] (MRN [id number]) as of 10/07/2018 07:25  Ref. Range 09/12/2018 10:32 09/15/2018 15:31 09/18/2018 15:00 09/22/2018 11:24 10/02/2018 09:48 10/07/2018 01:41  HCG, Beta Chain, Quant, S Latest Ref Range: <5 mIU/mL 4,648 (H)  5,556 (H)   329 (H)  hCG Quant Latest Units: mIU/mL  3,626  1,958 676     [place] Ob Less Than 14 Weeks With Ob Transvaginal  Result Date: 10/07/2018 CLINICAL DATA:  LEFT lower quadrant abdominal pain and vaginal bleeding. History of ectopic pregnancy in December. Per technologist notes, patient given methotrexate on 09/22/2018. EXAM: OBSTETRIC <14 WK US AND TRANSVAGINAL OB US TECHNIQUE: Both transabdominal and transvaginal  ultrasound examinations were performed for complete evaluation of the gestation as well as the maternal uterus, adnexal regions, and pelvic cul-de-sac. Transvaginal technique was performed to assess early pregnancy. COMPARISON:  None. FINDINGS: No intrauterine pregnancy identified. uterus/adnexae: Endometrial complex is normal in thickness. Small RIGHT ovarian cyst. No mass or free fluid identified in the RIGHT adnexal region. Complex cystic and solid-appearing mass in the LEFT adnexal region measures 7.8 x 5.6 x 5.6 cm. LEFT ovary is not seen separate from this mass. IMPRESSION: 1. Complex cystic and solid-appearing mass in the LEFT adnexal region, measuring 7.8 x 5.6 x 5.6 cm, almost certainly sequela of ruptured ectopic pregnancy given patient's provided history of recent ectopic. 2. No intrauterine pregnancy. Critical Value/emergent results were called by telephone at the time of interpretation on 10/07/2018 at 6:25 am to Dr. Bayard MalesANDOLPH BROWN , who verbally acknowledged these results. Electronically Signed   By: Bary RichardStan  Maynard M.D.   On: 10/07/2018 06:25     Patient Name: Jill HamburgShaquelah [name] [name] DOB: 12/26/1985 MRN: [id number]  ULTRASOUND REPORT  Location: [facility]  Date of Service: 10/03/2018     Indications:Pelvic Pain Findings:  The uterus is retroverted and measures 7.9x4.7x5.6cm. Echo texture is heterogenous with evidence of focal masses. Subserosal  pedunculated hypoechoic mass is seen = 6.4x6.4cm. (possibility of fibroid vs others)  The Endometrium measures 9.4 mm.  Right Ovary measures 6.8 cm3. It is normal in appearance. Left Ovary not seen Survey of the adnexa demonstrates no adnexal masses. There is a trace free fluid in the posterior cul de sac.  Impression: 1. Subserosal  pedunculated hypoechoic mass is seen = 6.4x6.4cm. (possibility of fibroid vs others) 2. NO  intra or extra uterine gestational sac is seen   Recommendations: 1.Clinical correlation with the  patient's History and Physical Exam.   Abeer Alsammarraie, RDMS   On further review of this ultrasound, the hypoechoic mass appears to be originating from the left adenexa.  To correlate with patient's history of recent ectopic pregnancy and physical exam.   Hildred Laserherry, Joron Velis, MD [facility]    Assessment/Plan: 1. Ectopic Pregnancy with history of prior ectopic pregnancy - Patient was counseled regarding need for laparoscopic salpingectomy. Risks of surgery including bleeding which may require transfusion or reoperation, infection, injury to bowel or other surrounding organs, need for additional procedures including laparotomy and other postoperative/anesthesia complications were explained to patient.  Patient understands that after removal of current ectopic pregnancy that she will be rendered sterile as her right tube was removed in August with previous ectopic surgery.  Written informed consent was obtained.  Patient very concerned about pain control, as  she notes that she had to spend 3 days in the hospital last admission at [facility]. Is worried that she will require another 3 day admission.  Discussed with patient that usually surgery is outpatient, and can go home after several hours of observation however patient notes that she at least desires to spend 1 night in the hospital. Discussed that we will admit for extended observation after surgery, however if her pain is better managed this time, she is able to go home later this evening. Patient agrees to paln.     Hildred Laser, MD [facility]

## 2018-10-07 NOTE — ED Notes (Signed)
Report to jennifer, rn.  

## 2018-10-07 NOTE — Transfer of Care (Signed)
Immediate Anesthesia Transfer of Care Note  Patient: Jill [name]  Procedure(s) Performed: laparoscopic left salpingectomy (Left )  Patient Location: PACU  Anesthesia Type:General  Level of Consciousness: sedated  Airway & Oxygen Therapy: Patient Spontanous Breathing and Patient connected to nasal cannula oxygen  Post-op Assessment: Report given to RN and Post -op Vital signs reviewed and stable  Post vital signs: Reviewed and stable  Last Vitals:  Vitals Value Taken Time  BP    Temp    Pulse 87 10/07/2018 10:48 AM  Resp 12 10/07/2018 10:48 AM  SpO2 100 % 10/07/2018 10:48 AM  Vitals shown include unvalidated device data.  Last Pain:  Vitals:   10/07/18 0737  TempSrc:   PainSc: 5          Complications: No apparent anesthesia complications

## 2018-10-07 NOTE — ED Notes (Signed)
Pt returned from ultrasound

## 2018-10-07 NOTE — ED Notes (Signed)
Pt currently in ultrasound

## 2018-10-08 ENCOUNTER — Encounter: Payer: Self-pay | Admitting: Obstetrics and Gynecology

## 2018-10-08 LAB — CBC
HCT: 30.7 % — ABNORMAL LOW (ref 36.0–46.0)
Hemoglobin: 9.5 g/dL — ABNORMAL LOW (ref 12.0–15.0)
MCH: 26.8 pg (ref 26.0–34.0)
MCHC: 30.9 g/dL (ref 30.0–36.0)
MCV: 86.7 fL (ref 80.0–100.0)
Platelets: 319 10*3/uL (ref 150–400)
RBC: 3.54 MIL/uL — ABNORMAL LOW (ref 3.87–5.11)
RDW: 14.1 % (ref 11.5–15.5)
WBC: 8.5 10*3/uL (ref 4.0–10.5)
nRBC: 0 % (ref 0.0–0.2)

## 2018-10-08 LAB — COMPREHENSIVE METABOLIC PANEL
ALT: 8 U/L (ref 0–44)
AST: 21 U/L (ref 15–41)
Albumin: 3.4 g/dL — ABNORMAL LOW (ref 3.5–5.0)
Alkaline Phosphatase: 52 U/L (ref 38–126)
Anion gap: 6 (ref 5–15)
BUN: 10 mg/dL (ref 6–20)
CO2: 26 mmol/L (ref 22–32)
Calcium: 8.5 mg/dL — ABNORMAL LOW (ref 8.9–10.3)
Chloride: 107 mmol/L (ref 98–111)
Creatinine, Ser: 1.06 mg/dL — ABNORMAL HIGH (ref 0.44–1.00)
GFR calc Af Amer: >60 mL/min (ref >60)
GFR calc non Af Amer: >60 mL/min (ref >60)
Glucose, Bld: 115 mg/dL — ABNORMAL HIGH (ref 70–99)
Potassium: 3.8 mmol/L (ref 3.5–5.1)
Sodium: 139 mmol/L (ref 135–145)
Total Bilirubin: 0.4 mg/dL (ref 0.3–1.2)
Total Protein: 6.1 g/dL — ABNORMAL LOW (ref 6.5–8.1)

## 2018-10-08 MED ORDER — LORAZEPAM 2 MG/ML IJ SOLN
INTRAMUSCULAR | Status: AC
  Filled 2018-10-08: qty 1

## 2018-10-08 MED ORDER — LORAZEPAM 2 MG/ML IJ SOLN
1.0000 mg | INTRAMUSCULAR | Status: AC
  Administered 2018-10-08: 1 mg via INTRAVENOUS

## 2018-10-08 MED ORDER — LORAZEPAM 2 MG/ML IJ SOLN
1.0000 mg | Freq: Once | INTRAMUSCULAR | Status: AC
  Administered 2018-10-08: 1 mg via INTRAVENOUS
  Filled 2018-10-08: qty 1

## 2018-10-08 MED ORDER — LEVETIRACETAM IN NACL 1000 MG/100ML IV SOLN: 1000.0000 mg | Freq: Once | INTRAVENOUS | Status: DC

## 2018-10-08 MED ORDER — LORAZEPAM 2 MG/ML IJ SOLN: 2.0000 mg | Freq: Once | INTRAMUSCULAR | Status: DC | PRN

## 2018-10-08 MED ORDER — LEVETIRACETAM 500 MG PO TABS
1500.0000 mg | ORAL_TABLET | Freq: Two times a day (BID) | ORAL | Status: DC
  Administered 2018-10-09: 1500 mg via ORAL
  Filled 2018-10-08: qty 3

## 2018-10-08 MED ORDER — LORAZEPAM 2 MG/ML IJ SOLN
INTRAMUSCULAR | Status: AC
  Administered 2018-10-08: 1 mg via INTRAVENOUS
  Filled 2018-10-08: qty 1

## 2018-10-08 MED ORDER — LORAZEPAM 2 MG/ML IJ SOLN: 1.0000 mg | INTRAMUSCULAR | Status: AC

## 2018-10-08 MED ORDER — OXYCODONE-ACETAMINOPHEN 5-325 MG PO TABS
1.0000 | ORAL_TABLET | Freq: Four times a day (QID) | ORAL | Status: DC | PRN
  Administered 2018-10-08: 2 via ORAL
  Administered 2018-10-09: 1 via ORAL
  Administered 2018-10-09: 2 via ORAL
  Filled 2018-10-08: qty 1
  Filled 2018-10-08 (×2): qty 2

## 2018-10-08 MED ORDER — LEVETIRACETAM IN NACL 1000 MG/100ML IV SOLN
1000.0000 mg | INTRAVENOUS | Status: AC
  Administered 2018-10-08: 1000 mg via INTRAVENOUS
  Filled 2018-10-08: qty 100

## 2018-10-08 MED ORDER — LORAZEPAM 2 MG/ML IJ SOLN
1.0000 mg | Freq: Once | INTRAMUSCULAR | Status: AC
  Administered 2018-10-08: 1 mg via INTRAVENOUS

## 2018-10-08 NOTE — Progress Notes (Signed)
Post-Operative Day # 1, s/p laparoscopic left salpingectomy with adhesiolysis. H/o seizure disorder with 2 seizures overnight.   Subjective: up ad lib, voiding and + flatus.  Patient notes that she is nauseated this morning and vomited earlier.    Objective: Temp:  [97.5 F (36.4 C)-98.7 F (37.1 C)] 98.7 F (37.1 C) (01/26 0745) Pulse Rate:  [81-129] 95 (01/26 0745) Resp:  [12-20] 18 (01/26 0745) BP: (94-124)/(57-87) 96/62 (01/26 0745) SpO2:  [98 %-100 %] 98 % (01/26 0745)  Physical Exam:  General: alert and no distress  Lungs: clear to auscultation bilaterally Heart: regular rate and rhythm, S1, S2 normal, no murmur, click, rub or gallop Abdomen: soft, non-tender; bowel sounds normal; no masses,  no organomegaly Pelvis:Bleeding: scant,  Incision: healing well, no significant drainage, no dehiscence, no significant erythema Extremities: DVT Evaluation: No evidence of DVT seen on physical exam. Negative Homan's sign. No cords or calf tenderness. No significant calf/ankle edema.  Recent Labs    10/07/18 0143  HGB 10.4*  HCT 33.3*     Lab Results  Component Value Date   CREATININE 1.04 (H) 10/07/2018   CREATININE 1.08 (H) 09/18/2018   CREATININE 0.78 09/14/2018     Assessment/Plan: Continue routine post-op care Zofran for nausea, encouraged bland diet (patient notes eating spicy food last night) Continue Keprra for seizure management. To be seen by Hospitalist this morning. Mildly elevated Creatinine pre-op, however appears to be slightly improved from prior value several weeks ago.  Will continue to follow. Possible d/c home later this evening if improved.    Hildred Laser, MD [facility]

## 2018-10-08 NOTE — Progress Notes (Signed)
Pt complained of tremors to right hand, stated she "would like to take her medication early to prevent from having a seizure". Pt stated that she sometimes "has to take her meds early if she feels a tremor coming on". Asked patient if she felt she was going to have a seizure, pt responded "not yet, but I will if I don't take my meds soon" Oral medication given earl;y per pt request. Pt complains of dizziness, denies nausea. MD placed orders for orthostatic vital signs, due to pt's dizziness and tremor, did not obtain standing VS. Oncoming shift notified of update to plan of care.

## 2018-10-08 NOTE — Progress Notes (Signed)
Report given to [facility] RN Dawn. Patient stable, in no distress. Patient transported by bed by orderly to 2C.

## 2018-10-08 NOTE — Progress Notes (Signed)
7:03pm- Pt began having strong tremors in right arm and right leg. Seizure precautions maintained, suction at bedside. Charge nurse notified. MD paged. 7:05pm- Pt began having active seizure, precautions maintained, suction at bedside. Pt placed in semi-fowlers position. Vital signs obtained. MD notified. Order for 1 mg Ativan IV stat. Rapid response initiated. Dr. Valentino Saxon available over phone for orders via [facility]. 7:07pm- Gave 1mg  ativan IV. Seizure with full tremors continued. Pt not responsive. 7:10pm- Gave 1mg  Ativan IV. Full seizure activity, pt spitting up and drooling profusely, oral suctioning applied. Airway remains clear. Pt HR 120's, O2 @ 99%.  7:12pm- Seizure continues, 1 mg Ativan given. Hospitalist enroute to bedside. Dr. Valentino Saxon at bedside. Vital signs obtained. 7:14pm- ICU charge nurse at bedside, seizure continues. Monitoring continued.  7:16pm- Seizure continues, 1 mg Ativan IV given.  7:17pm- Pt opened eyes, responded to stimuation. Tremors continue. 7:19pm- Pt responded, able to vocalize her name and she is oriented to person and place. Mild tremors continue in right hand/arm. Hospitalist at bedside. Orders to follow.   7:30 pm- Seizure lasted total of 12 minutes. Total of 4mg  IV ativan given. Plan is to transfer pt to Med-Surg for monitoring. Dr. Valentino Saxon and Dr. Joneen Roach to place orders.   7:45pm- Orders placed for IV fluids LR @ 100/hr, IV Keppra 1000mg  STAT. Nursing supervisor Dewayne Hatch) updated via phone.

## 2018-10-08 NOTE — Significant Event (Signed)
Rapid Response Event Note  Overview: Time Called: 1910 Arrival Time: 1914 Event Type: Neurologic  Initial Focused Assessment: Patient eyes closed, and slow to respond at first. No witnessed seizure while ICU charge in room. Ativan was administered prior to arrival. Once surgeon, Dr. Valentino Saxon was in the room, patient was more responsive. When hospitalist arrived at room, patient was alert and oriented and able to give a history of her disease and medications.   Interventions: Increase seizure medication strength, was on lowest dose prior. Transfer to [facility] room 208 per Dr. Joneen Roach. Plan of Care (if not transferred):  Event Summary: Name of Physician Notified: Cherry at 1844    at   1930 Dr. Joneen Roach was notified and came to the room from the ED.   Outcome: Patient transfer to [facility]. Patient stabilized with medications.   Event End Time: 1955 PM  Wayde Gopaul M

## 2018-10-08 NOTE — Progress Notes (Addendum)
Interval progress note:  Contacted by nurse who notes that patient reports dizziness and 10/10 pain (as nurse had been planning for anticipated discharge this evening).  Notes that patient's mother also contacted nursing station and reported that patient has "had 6 seizures and is now reporting dizziness and why would we discharge her home?".   I came to assess the patient. Discussed why patient has not informed anyone of any of her complaints.  Questioned as to why patient informed her mother that she had experienced 6 seizures when she has only had 2 noted seizures last night. Patient notes that she did not inform her mother of having additional seizures. On further discussion, patient states that she has been dizzy for most of the day, but just has been trying to sleep it off.  Dizziness typically occurs with ambulation or sitting up.  She further reports that she has had a history of dizziness prior to admission, and even prior to most recent events with current ectopic pregnancy prior to December.  Symptoms have been ongoing for at least a year or 2.  Usually just tries to lie down to make it go away.  Discussed that if her current symptoms were due to orthostatic hypotension, then this would not something that would be automatically corrected during this admission. However, we can decrease other causes for her dizziness, including ensuring she is adequately hydrated, checking her CBC to ensure no worsening anemia, checking her electrolytes.  Will also check for orthostatics. Patient also noted to not have requested any pain medication for most of the day today. Will give Percocet for pain (however patient is very stoic, and although notes 10/10 pain appears very comfortable). Assessed any other barriers for discharge, and patient's safety in the home.  Only barrier is that patient would not have a ride to go home this evening if all labs were normal.  If no other issues, will plan for d/c home in the  morning.    Hildred Laser, MD [facility]

## 2018-10-08 NOTE — Progress Notes (Addendum)
Pt's mother called nurses station, states she wants to speak with nurse who is discharging her daughter. Transferred phone call to daughter's room, obtained consent to speak to family regarding care. Pt's mother stated she "did not want her daughter to be discharged tonight because she is feeling dizzy again". Informed pt's mother that while discharge plans had been discussed, no orders were in place. I informed pt's mom that if patient was feeling better, able to eat and was medically stable that her MD said she might be able to go home this evening. I explained that we did not say that patient WOULD be discharged. Pt then resumed phone call with her mother.   Pt is agreeable to current plan of care, stated that she does not want to go home yet. Paged MD and updated. Dr. Valentino Saxon stated she is enroute, will see patient shortly. Orders placed to d/c oxycodone and tylenol, new orders placed for percocet.

## 2018-10-08 NOTE — H&P (Addendum)
PCP:   [facility]   Chief Complaint:  Seizure  HPI: This is a 33 year old female who was admitted yesterday.  She was brought in for a ruptured ectopic pregnancy.  She underwent laparoscopic surgery.  Per OB/GYN she was ready for being discharged but the patient asked ardently to be admitted therefore she was brought in for overnight observation.  The patient has a history of seizures and was apparently admitted here 3 weeks ago for seizures, at that point she was placed on Keppra 500 mg p.o. daily.  Yesterday the OB/GYN came in to discharge the patient, when the patient started seizing.  The hospitalist service was called as the patient was having seizures.  She is on 500 mg of Keppra p.o. twice daily.  She was given Ativan 2 mg, her seizure stopped and she was loaded with 1000 mg IV Keppra.  Per patient report she had missed a single dose of her Keppra prior to coming in.  Tonight she again seized, she received a total of 4 mg IV Ativan.  Her seizures was distinguished.   Per patient she has had seizures all throughout her childhood.  Approximately a year ago her seizures have increased in frequency.  She was on Topamax prior to Keppra, this was a low-dose Topamax per patient.  He states she is currently only 500 mg Keppra p.o. twice daily.  The patient is not having any fevers, nausea, vomiting, no signs of infection.  Patient has a current left ectopic pregnancy, treated with Methotrexate on 09/12/2018, and also a history of prior right ectopic pregnancy s/p [facility].  Her postoperative course was also complicated by seizures per patient.  Patient transferred to hospitalsit service  Provided by patient, OB/GYN, and chart review.  Review of Systems:  The patient denies anorexia, fever, weight loss,, vision loss, decreased hearing, hoarseness, chest pain, syncope, dyspnea on exertion, peripheral edema, balance deficits,  hemoptysis, seizures, abdominal pain, melena, hematochezia, severe indigestion/heartburn, hematuria, incontinence, genital sores, muscle weakness, suspicious skin lesions, transient blindness, difficulty walking, depression, unusual weight change, abnormal bleeding, enlarged lymph nodes, angioedema, and breast masses.  Past Medical History: Past Medical History:  Diagnosis Date  . Anemia   . Headache   . Seizures (HCC)    Past Surgical History:  Procedure Laterality Date  . CESAREAN SECTION     x4  . CESAREAN SECTION N/A 10/07/2016   Procedure: CESAREAN SECTION;  Surgeon: Suzy Bouchardhomas J Schermerhorn, MD;  Location: [facility] ORS;  Service: Obstetrics;  Laterality: N/A;  . DIAGNOSTIC LAPAROSCOPY WITH REMOVAL OF ECTOPIC PREGNANCY Left 10/07/2018   Procedure: laparoscopic left salpingectomy;  Surgeon: Hildred Laserherry, Anika, MD;  Location: [facility] ORS;  Service: Gynecology;  Laterality: Left;    Medications: Prior to Admission medications   Medication Sig Start Date End Date Taking? Authorizing Provider  levETIRAcetam (KEPPRA) 500 MG tablet Take 1 tablet (500 mg total) by mouth 2 (two) times daily. 09/14/18 11/13/18 Yes Sainani, Rolly PancakeVivek J, MD  ondansetron (ZOFRAN) 4 MG tablet Take 1 tablet (4 mg total) by mouth every 8 (eight) hours as needed for nausea or vomiting. 10/03/18  Yes Hildred Laserherry, Anika, MD  oxyCODONE-acetaminophen (PERCOCET/ROXICET) 5-325 MG tablet Take 1-2 tablets by mouth every 6 (six) hours as needed. 10/03/18  Yes Hildred Laserherry, Anika, MD    Allergies:  No Known Allergies  Social History:  reports that she has quit smoking. She has never used smokeless tobacco. She reports that she does not drink alcohol  or use drugs.  Family History: Family History  Problem Relation Age of Onset  . Healthy Mother   . Healthy Father   . Cancer Neg Hx   . Thyroid disease Neg Hx   . Diabetes Neg Hx     Physical Exam: Vitals:   10/08/18 1915 10/08/18 1921 10/08/18 1929 10/08/18 1952  BP: 116/87 115/81 113/72 115/78   Pulse: (!) 125 (!) 122 (!) 120 (!) 114  Resp:      Temp:      TempSrc:      SpO2: 99% 97% 100% 99%  Weight:        General:  Alert and oriented times three, well developed and nourished, no acute distress Eyes: PERRLA, pink conjunctiva, no scleral icterus ENT: Moist oral mucosa, neck supple, no thyromegaly Lungs: clear to ascultation, no wheeze, no crackles, no use of accessory muscles Cardiovascular: regular rate and rhythm, no regurgitation, no gallops, no murmurs. No carotid bruits, no JVD Abdomen: soft, positive BS, non-tender, non-distended, no organomegaly, not an acute abdomen GU: not examined.  Postop tenderness to palpation.  Laparoscopic sites without signs of infection Neuro: CN II - XII grossly intact, sensation intact Musculoskeletal: strength 5/5 all extremities, no clubbing, cyanosis or edema Skin: no rash, no subcutaneous crepitation, no decubitus Psych: appropriate patient   Labs on Admission:  Recent Labs    10/07/18 0143 10/08/18 1848  NA 140 139  K 3.6 3.8  CL 107 107  CO2 27 26  GLUCOSE 90 115*  BUN 10 10  CREATININE 1.04* 1.06*  CALCIUM 9.1 8.5*   Recent Labs    10/07/18 0143 10/08/18 1848  AST 15 21  ALT 11 8  ALKPHOS 66 52  BILITOT 0.6 0.4  PROT 7.6 6.1*  ALBUMIN 4.5 3.4*   Recent Labs    10/07/18 0143  LIPASE 23   Recent Labs    10/07/18 0143 10/08/18 1848  WBC 7.7 8.5  HGB 10.4* 9.5*  HCT 33.3* 30.7*  MCV 84.9 86.7  PLT 344 319   No results for input(s): CKTOTAL, CKMB, CKMBINDEX, TROPONINI in the last 72 hours. Invalid input(s): POCBNP No results for input(s): DDIMER in the last 72 hours. No results for input(s): HGBA1C in the last 72 hours. No results for input(s): CHOL, HDL, LDLCALC, TRIG, CHOLHDL, LDLDIRECT in the last 72 hours. No results for input(s): TSH, T4TOTAL, T3FREE, THYROIDAB in the last 72 hours.  Invalid input(s): FREET3 No results for input(s): VITAMINB12, FOLATE, FERRITIN, TIBC, IRON, RETICCTPCT in the  last 72 hours.  Micro Results: No results found for this or any previous visit (from the past 240 hour(s)).   Radiological Exams on Admission: US Ob Less Than 14 Weeks With Ob Transvaginal  Result Date: 10/07/2018 CLINICAL DATA:  LEFT lower quadrant abdominal pain and vaginal bleeding. History of ectopic pregnancy in December. Per technologist notes, patient given methotrexate on 09/22/2018. EXAM: OBSTETRIC <14 WK [place] AND TRANSVAGINAL OB US TECHNIQUE: Both transabdominal and transvaginal ultrasound examinations were performed for complete evaluation of the gestation as well as the maternal uterus, adnexal regions, and pelvic cul-de-sac. Transvaginal technique was performed to assess early pregnancy. COMPARISON:  None. FINDINGS: No intrauterine pregnancy identified. uterus/adnexae: Endometrial complex is normal in thickness. Small RIGHT ovarian cyst. No mass or free fluid identified in the RIGHT adnexal region. Complex cystic and solid-appearing mass in the LEFT adnexal region measures 7.8 x 5.6 x 5.6 cm. LEFT ovary is not seen separate from this mass. IMPRESSION: 1. Complex cystic and  solid-appearing mass in the LEFT adnexal region, measuring 7.8 x 5.6 x 5.6 cm, almost certainly sequela of ruptured ectopic pregnancy given patient's provided history of recent ectopic. 2. No intrauterine pregnancy. Critical Value/emergent results were called by telephone at the time of interpretation on 10/07/2018 at 6:25 am to Dr. Bayard MalesANDOLPH BROWN , who verbally acknowledged these results. Electronically Signed   By: Bary RichardStan  Maynard M.D.   On: 10/07/2018 06:25    Assessment/Plan Present on Admission: Seizures -Patient will be transferred to medical floor on telemetry -Seizures likely due to suboptimal dosing of Keppra.  Will increase patient's Keppra to 1500 mg p.o. twice daily.  CT head or EEG not ordered currently. -Defer to a.m. team if neurology consult needed -We will look for any cause such as an infection.  UA and  blood cultures will be ordered.  No antibiotics ordered -Ativan as needed seizures  . Ruptured ectopic pregnancy -Per OB/GYN  . Chronic kidney disease -Stage I, mild.  At baseline  Luceil Herrin 10/08/2018, 8:02 PM

## 2018-10-08 NOTE — Progress Notes (Signed)
Interval Progress Note  Contacted by charge nurse while attending a delivery of another patient that Ms. Pollins was experiencing another seizure.  Note that they had given her 1 dose of Ativian 1 mg, but that her seizures continued.  Advised to push up to 4 mg of seizure medication. At 10 minute mark, rapid response was notified and responded. It was at this time that I was able to assess the patient as well.  When I arrived, patient appeared to be post-ictal, but was no longer experiencing any tremors. She was able to be responsive and answered questions asked.  Hospitalist (Dr. Brion Aliment) was also notified of patient's seizure activity again and came to assess patient.  Recommend increase in her Keppra dosing as she is currently on lowest dose and to get a Neurology consult. Will transfer care to Hospitalist as patient is cleared from GYN standpoint regarding her recent surgery (laparoscopic left salpingectomy for ectopic pregnancy).  Recent labs reviewed.    CBC Latest Ref Rng & Units 10/08/2018 10/07/2018 09/14/2018  WBC 4.0 - 10.5 K/uL 8.5 7.7 7.7  Hemoglobin 12.0 - 15.0 g/dL 7.5(P) 10.4(L) 9.7(L)  Hematocrit 36.0 - 46.0 % 30.7(L) 33.3(L) 30.6(L)  Platelets 150 - 400 K/uL 319 344 283    Lab Results  Component Value Date   CREATININE 1.06 (H) 10/08/2018   BUN 10 10/08/2018   NA 139 10/08/2018   K 3.8 10/08/2018   CL 107 10/08/2018   CO2 26 10/08/2018   Lab Results  Component Value Date   CREATININE 1.06 (H) 10/08/2018   CREATININE 1.04 (H) 10/07/2018   CREATININE 1.08 (H) 09/18/2018    Hildred Laser, MD [facility]

## 2018-10-09 ENCOUNTER — Other Ambulatory Visit: Payer: Medicaid Other

## 2018-10-09 DIAGNOSIS — N736 Female pelvic peritoneal adhesions (postinfective): Secondary | ICD-10-CM | POA: Diagnosis present

## 2018-10-09 DIAGNOSIS — N181 Chronic kidney disease, stage 1: Secondary | ICD-10-CM | POA: Diagnosis present

## 2018-10-09 DIAGNOSIS — O009 Unspecified ectopic pregnancy without intrauterine pregnancy: Secondary | ICD-10-CM | POA: Diagnosis present

## 2018-10-09 DIAGNOSIS — O00102 Left tubal pregnancy without intrauterine pregnancy: Secondary | ICD-10-CM | POA: Diagnosis present

## 2018-10-09 DIAGNOSIS — R569 Unspecified convulsions: Secondary | ICD-10-CM | POA: Diagnosis not present

## 2018-10-09 DIAGNOSIS — G40901 Epilepsy, unspecified, not intractable, with status epilepticus: Secondary | ICD-10-CM | POA: Diagnosis present

## 2018-10-09 DIAGNOSIS — Z87891 Personal history of nicotine dependence: Secondary | ICD-10-CM | POA: Diagnosis not present

## 2018-10-09 MED ORDER — LEVETIRACETAM 500 MG PO TABS
1000.0000 mg | ORAL_TABLET | Freq: Two times a day (BID) | ORAL | Status: DC
  Administered 2018-10-09 – 2018-10-10 (×2): 1000 mg via ORAL
  Filled 2018-10-09 (×2): qty 2

## 2018-10-09 NOTE — Progress Notes (Signed)
[employer] - [place] [facility]     PATIENT NAME: Jill [name]    MR#:  [id number]  DATE OF BIRTH:  02-01-1986  SUBJECTIVE:   Patient transferred from [facility] service to hospitalist service due to recurrent seizures.  No further seizure type activity overnight, patient is alert and oriented.  No other acute events overnight.  REVIEW OF SYSTEMS:    Review of Systems  Constitutional: Negative for chills and fever.  HENT: Negative for congestion and tinnitus.   Eyes: Negative for blurred vision and double vision.  Respiratory: Negative for cough, shortness of breath and wheezing.   Cardiovascular: Negative for chest pain, orthopnea and PND.  Gastrointestinal: Negative for abdominal pain, diarrhea, nausea and vomiting.  Genitourinary: Negative for dysuria and hematuria.  Neurological: Negative for dizziness, sensory change and focal weakness.  All other systems reviewed and are negative.   Nutrition: Regular Tolerating Diet: Yes Tolerating PT: Ambulatory  DRUG ALLERGIES:  No Known Allergies  VITALS:  Blood pressure 103/71, pulse 88, temperature 98.5 F (36.9 C), temperature source Oral, resp. rate 20, height 5\' 3"  (1.6 m), weight 102.1 kg, SpO2 99 %, unknown if currently breastfeeding.  PHYSICAL EXAMINATION:   Physical Exam  GENERAL:  33 y.o.-year-old patient lying in bed in no acute distress.  EYES: Pupils equal, round, reactive to light and accommodation. No scleral icterus. Extraocular muscles intact.  HEENT: Head atraumatic, normocephalic. Oropharynx and nasopharynx clear.  NECK:  Supple, no jugular venous distention. No thyroid enlargement, no tenderness.  LUNGS: Normal breath sounds bilaterally, no wheezing, rales, rhonchi. No use of accessory muscles of respiration.  CARDIOVASCULAR: S1, S2 normal. No murmurs, rubs, or gallops.  ABDOMEN: Soft, nontender, nondistended. Bowel sounds present. No organomegaly or mass.  EXTREMITIES: No cyanosis,  clubbing or edema b/l.    NEUROLOGIC: Cranial nerves II through XII are intact. No focal Motor or sensory deficits b/l.   PSYCHIATRIC: The patient is alert and oriented x 3.  SKIN: No obvious rash, lesion, or ulcer.    LABORATORY PANEL:   CBC Recent Labs  Lab 10/08/18 1848  WBC 8.5  HGB 9.5*  HCT 30.7*  PLT 319   ------------------------------------------------------------------------------------------------------------------  Chemistries  Recent Labs  Lab 10/08/18 1848  NA 139  K 3.8  CL 107  CO2 26  GLUCOSE 115*  BUN 10  CREATININE 1.06*  CALCIUM 8.5*  AST 21  ALT 8  ALKPHOS 52  BILITOT 0.4   ------------------------------------------------------------------------------------------------------------------  Cardiac Enzymes No results for input(s): TROPONINI in the last 168 hours. ------------------------------------------------------------------------------------------------------------------  RADIOLOGY:  No results found.   ASSESSMENT AND PLAN:   33 year old female with past medical history of seizures, previous ectopic pregnancy who was transferred to the hospital service due to recurrent seizures.  1.  Recurrent seizures- suspected to be secondary to noncompliance/wrong dosing of her antiepileptics. - Patient apparently missed a dose of her Keppra and did not receive any antiepileptics on first day of admission under OB/GYN. - Patient has now been loaded with IV Keppra and currently is on a higher dose of Keppra. - We will get neurology consult to make further adjustments to her meds. - Continue PRN Ativan for now, seizure precautions.  2.  History of recent ectopic pregnancy-patient used to be on methotrexate which has now been discontinued.  Patient is status post left salpingectomy. -Continue follow-up with outpatient OB/GYN.     All the records are reviewed and case discussed with Care Management/Social Worker. Management plans discussed with the  patient,  family and they are in agreement.  CODE STATUS: Full code  DVT Prophylaxis: Ambulatory  TOTAL TIME TAKING CARE OF THIS PATIENT: 30 minutes.   POSSIBLE D/C IN 1-2 DAYS, DEPENDING ON CLINICAL CONDITION.   Houston Siren M.D on 10/09/2018 at 11:54 AM  Between 7am to 6pm - Pager - [phone]  After 6pm go to [email or web address] - Therapist, nutritional Hospitalists  Office  [phone]  CC: Primary care physician; [facility]

## 2018-10-09 NOTE — Consult Note (Signed)
Reason for Consult:Seizures Referring Physician: Cherlynn KaiserSainani  CC: Seizures  HPI: Jill [name] is an 33 y.o. female with a history of seizures, previously on Topamax, changes to Keppra due to risk of pregnancy.  Patient reports after her last hospitalization being discharged on 1000mg  BID of Keppra.  Patient reports that she has been compliant.  Was brought in for a ruptured ectopic pregnancy.  She underwent laparoscopic surgery.  Prior to discharge was noted to have seizure activity.  Discharge cancelled at that time.  She was given Ativan 2 mg, her seizure stopped and she was loaded with 1000 mg IV Keppra.  Per patient report she had missed a single dose of her Keppra prior to coming in.    Past Medical History:  Diagnosis Date  . Anemia   . Headache   . Seizures (HCC)     Past Surgical History:  Procedure Laterality Date  . CESAREAN SECTION     x4  . CESAREAN SECTION N/A 10/07/2016   Procedure: CESAREAN SECTION;  Surgeon: Suzy Bouchardhomas J Schermerhorn, MD;  Location: [facility] ORS;  Service: Obstetrics;  Laterality: N/A;  . DIAGNOSTIC LAPAROSCOPY WITH REMOVAL OF ECTOPIC PREGNANCY Left 10/07/2018   Procedure: laparoscopic left salpingectomy;  Surgeon: Hildred Laserherry, Anika, MD;  Location: [facility] ORS;  Service: Gynecology;  Laterality: Left;    Family History  Problem Relation Age of Onset  . Healthy Mother   . Healthy Father   . Cancer Neg Hx   . Thyroid disease Neg Hx   . Diabetes Neg Hx     Social History:  reports that she has quit smoking. She has never used smokeless tobacco. She reports that she does not drink alcohol or use drugs.  No Known Allergies  Medications:  I have reviewed the patient's current medications. Scheduled: . docusate sodium  100 mg Oral BID  . levETIRAcetam  1,000 mg Oral BID  . LORazepam  1 mg Intravenous STAT  . LORazepam  1 mg Intravenous STAT  . pantoprazole  40 mg Oral Daily    ROS: History obtained from the patient  General ROS: negative for - chills,  fatigue, fever, night sweats, weight gain or weight loss Psychological ROS: negative for - behavioral disorder, hallucinations, memory difficulties, mood swings or suicidal ideation Ophthalmic ROS: negative for - blurry vision, double vision, eye pain or loss of vision ENT ROS: dizziness Allergy and Immunology ROS: negative for - hives or itchy/watery eyes Hematological and Lymphatic ROS: negative for - bleeding problems, bruising or swollen lymph nodes Endocrine ROS: negative for - galactorrhea, hair pattern changes, polydipsia/polyuria or temperature intolerance Respiratory ROS: negative for - cough, hemoptysis, shortness of breath or wheezing Cardiovascular ROS: negative for - chest pain, dyspnea on exertion, edema or irregular heartbeat Gastrointestinal ROS: nausea Genito-Urinary ROS: negative for - dysuria, hematuria, incontinence or urinary frequency/urgency Musculoskeletal ROS: generalized weakness Neurological ROS: as noted in HPI Dermatological ROS: negative for rash and skin lesion changes  Physical Examination: Blood pressure 102/65, pulse 98, temperature 98.2 F (36.8 C), temperature source Oral, resp. rate 20, height 5\' 3"  (1.6 m), weight 102.1 kg, SpO2 96 %, unknown if currently breastfeeding.  HEENT-  Normocephalic, no lesions, without obvious abnormality.  Normal external eye and conjunctiva.  Normal TM's bilaterally.  Normal auditory canals and external ears. Normal external nose, mucus membranes and septum.  Normal pharynx. Cardiovascular- S1, S2 normal, pulses palpable throughout   Lungs- chest clear, no wheezing, rales, normal symmetric air entry Abdomen- soft, non-tender; bowel sounds normal; no masses,  no organomegaly Extremities- no edema Lymph-no adenopathy palpable Musculoskeletal-no joint tenderness, deformity or swelling Skin-warm and dry, no hyperpigmentation, vitiligo, or suspicious lesions  Neurological Examination   Mental Status: Alert, oriented, thought  content appropriate.  Speech fluent without evidence of aphasia.  Able to follow 3 step commands without difficulty. Cranial Nerves: II: Discs flat bilaterally; Visual fields grossly normal, pupils equal, round, reactive to light and accommodation III,IV, VI: ptosis not present, extra-ocular motions intact bilaterally V,VII: smile symmetric, facial light touch sensation normal bilaterally VIII: hearing normal bilaterally IX,X: gag reflex present XI: bilateral shoulder shrug XII: midline tongue extension Motor: Generalized weakness but able to lift all extremities against gravity and no focal weakness noted.   Sensory: Pinprick and light touch intact throughout, bilaterally Deep Tendon Reflexes: 2+ and symmetric throughout Plantars: Right: downgoing   Left: downgoing Cerebellar: normal finger-to-nose and normal heel-to-shin testing bilaterally Gait: not tested due to safety concerns    Laboratory Studies:   Basic Metabolic Panel: Recent Labs  Lab 10/07/18 0143 10/08/18 1848  NA 140 139  K 3.6 3.8  CL 107 107  CO2 27 26  GLUCOSE 90 115*  BUN 10 10  CREATININE 1.04* 1.06*  CALCIUM 9.1 8.5*    Liver Function Tests: Recent Labs  Lab 10/07/18 0143 10/08/18 1848  AST 15 21  ALT 11 8  ALKPHOS 66 52  BILITOT 0.6 0.4  PROT 7.6 6.1*  ALBUMIN 4.5 3.4*   Recent Labs  Lab 10/07/18 0143  LIPASE 23   No results for input(s): AMMONIA in the last 168 hours.  CBC: Recent Labs  Lab 10/07/18 0143 10/08/18 1848  WBC 7.7 8.5  HGB 10.4* 9.5*  HCT 33.3* 30.7*  MCV 84.9 86.7  PLT 344 319    Cardiac Enzymes: No results for input(s): CKTOTAL, CKMB, CKMBINDEX, TROPONINI in the last 168 hours.  BNP: Invalid input(s): POCBNP  CBG: No results for input(s): GLUCAP in the last 168 hours.  Microbiology: Results for orders placed or performed during the hospital encounter of 09/13/18  Urine Culture     Status: Abnormal   Collection Time: 09/14/18  7:24 AM  Result Value Ref  Range Status   Specimen Description   Final    URINE, RANDOM Performed at [facility], [address] [place], [place] [address]    Special Requests   Final    NONE Performed at [facility], [address] [place], [place] [address]    Culture >=100,000 COLONIES/mL ESCHERICHIA COLI (A)  Final   Report Status 09/16/2018 FINAL  Final   Organism ID, Bacteria ESCHERICHIA COLI (A)  Final      Susceptibility   Escherichia coli - MIC*    AMPICILLIN >=32 RESISTANT Resistant     CEFAZOLIN 16 SENSITIVE Sensitive     CEFTRIAXONE <=1 SENSITIVE Sensitive     CIPROFLOXACIN >=4 RESISTANT Resistant     GENTAMICIN >=16 RESISTANT Resistant     IMIPENEM <=0.25 SENSITIVE Sensitive     NITROFURANTOIN <=16 SENSITIVE Sensitive     TRIMETH/SULFA >=320 RESISTANT Resistant     AMPICILLIN/SULBACTAM >=32 RESISTANT Resistant     PIP/TAZO 64 INTERMEDIATE Intermediate     Extended ESBL NEGATIVE Sensitive     * >=100,000 COLONIES/mL ESCHERICHIA COLI    Coagulation Studies: No results for input(s): LABPROT, INR in the last 72 hours.  Urinalysis:  Recent Labs  Lab 10/07/18 0141  COLORURINE AMBER*  LABSPEC 1.027  PHURINE 5.0  GLUCOSEU NEGATIVE  HGBUR LARGE*  BILIRUBINUR  NEGATIVE  KETONESUR 20*  PROTEINUR 100*  NITRITE POSITIVE*  LEUKOCYTESUR NEGATIVE    Lipid Panel:  No results found for: CHOL, TRIG, HDL, CHOLHDL, VLDL, LDLCALC  HgbA1C: No results found for: HGBA1C  Urine Drug Screen:      Component Value Date/Time   LABOPIA NONE DETECTED 09/18/2018 1730   COCAINSCRNUR NONE DETECTED 09/18/2018 1730   LABBENZ POSITIVE (A) 09/18/2018 1730   AMPHETMU NONE DETECTED 09/18/2018 1730   THCU NONE DETECTED 09/18/2018 1730   LABBARB NONE DETECTED 09/18/2018 1730    Alcohol Level: No results for input(s): ETH in the last 168 hours.   Imaging: No results found.   Assessment/Plan: 33 year old female with a history of seizures on Keppra at 1000mg  BID.  Some question of a  missed dose prior to presentation and some doing issues after presentation as well.  Noted to have breakthrough seizures.  Now with complaints of nausea and dizziness.  On a larger dose of Keppra and has received benzodiazepines.  This likely contributing to complaints.  Recommendations: 1. Patient to return to 1000mg  BID of Keppra 2. Continue seizure precautions 3. Patient unable to drive, operate heavy machinery, perform activities at heights and participate in water activities until release by outpatient physician. 4. Patient to continue neurological follow up on an outpatient basis   Thana FarrLeslie Earnie Rockhold, MD Neurology [phone] 10/09/2018, 1:35 PM

## 2018-10-09 NOTE — Progress Notes (Signed)
Per MD okay for RN to DC telemetry order,.  

## 2018-10-10 ENCOUNTER — Telehealth: Payer: Self-pay | Admitting: Obstetrics and Gynecology

## 2018-10-10 MED ORDER — BISACODYL 10 MG RE SUPP
10.0000 mg | Freq: Once | RECTAL | Status: AC
  Administered 2018-10-10: 10 mg via RECTAL
  Filled 2018-10-10: qty 1

## 2018-10-10 MED ORDER — LEVETIRACETAM 500 MG PO TABS: 1000.0000 mg | ORAL_TABLET | Freq: Two times a day (BID) | ORAL | 1 refills | Status: DC

## 2018-10-10 MED ORDER — ACETAMINOPHEN 325 MG PO TABS
650.0000 mg | ORAL_TABLET | Freq: Four times a day (QID) | ORAL | Status: DC | PRN
  Administered 2018-10-10: 650 mg via ORAL
  Filled 2018-10-10: qty 2

## 2018-10-10 MED ORDER — BUTALBITAL-APAP-CAFFEINE 50-325-40 MG PO TABS
1.0000 | ORAL_TABLET | Freq: Once | ORAL | Status: AC
  Administered 2018-10-10: 1 via ORAL
  Filled 2018-10-10: qty 1

## 2018-10-10 NOTE — Telephone Encounter (Signed)
The patient's mother is asking if the patient can get some medication for nausea/vomiting and pain.  She states the hospital just discharged her and gave her nothing for pain or the vomiting.  The patient is vomiting q.3830mins.  Please advise, thanks.

## 2018-10-10 NOTE — Progress Notes (Signed)
   10/10/18 1400  Clinical Encounter Type  Visited With Patient  Visit Type Initial  Referral From Chaplain  Consult/Referral To Chaplain  Chaplain went to visit patient and patient was leaving the hospital. Chaplain made her pastoral presence known and told the patient she was coming to see her, however, she was glad she was leaving and hope she continues to get better. Patient said thank you.

## 2018-10-10 NOTE — Progress Notes (Signed)
Can you order fioricet or tylenol for a headache of 10/10.

## 2018-10-10 NOTE — Discharge Summary (Signed)
[employer] - [place] at [facility]   PATIENT NAME: Jill [name]    MR#:  [id number]  DATE OF BIRTH:  [birth year]  DATE OF ADMISSION:  10/07/2018 ADMITTING PHYSICIAN: Hildred Laser, MD  DATE OF DISCHARGE: 10/10/2018  PRIMARY CARE PHYSICIAN: [facility]    ADMISSION DIAGNOSIS:  Pelvic pain [R10.2]  DISCHARGE DIAGNOSIS:  Active Problems:   Chronic kidney disease   Seizure (HCC)   Ruptured ectopic pregnancy   Seizures (HCC)   SECONDARY DIAGNOSIS:   Past Medical History:  Diagnosis Date  . Anemia   . Headache   . Seizures [facility])     HOSPITAL COURSE:   33 year old female with past medical history of seizures, previous ectopic pregnancy who was transferred to the hospital service due to recurrent seizures.  1.  Recurrent seizures- suspected to be secondary to noncompliance/wrong dosing of her antiepileptics. - Patient apparently missed a dose of her Keppra and did not receive any antiepileptics on first day of admission.   -Patient was loaded with IV Keppra and started on a higher dose of Keppra.  She has had no further seizure activity.  Patient was seen by neurology who agreed with this management.  Did not recommend any further imaging or further work-up of this.  Patient presently is going be discharged on 1000 g of Keppra twice daily with outpatient follow-up with neurology.  2.  History of recent ectopic pregnancy-patient used to be on methotrexate which has now been discontinued.  Patient is status post left salpingectomy. -Continue follow-up with outpatient OB/GYN  DISCHARGE CONDITIONS:   Stable.   CONSULTS OBTAINED:  Treatment Team:  Kym Groom, MD Thana Farr, MD  DRUG ALLERGIES:  No Known Allergies  DISCHARGE MEDICATIONS:   Allergies as of 10/10/2018   No Known Allergies     Medication List    TAKE these medications   levETIRAcetam 500 MG tablet Commonly known as:  KEPPRA Take 2 tablets  (1,000 mg total) by mouth 2 (two) times daily. What changed:  how much to take   ondansetron 4 MG tablet Commonly known as:  ZOFRAN Take 1 tablet (4 mg total) by mouth every 8 (eight) hours as needed for nausea or vomiting.   oxyCODONE-acetaminophen 5-325 MG tablet Commonly known as:  PERCOCET/ROXICET Take 1-2 tablets by mouth every 6 (six) hours as needed.         DISCHARGE INSTRUCTIONS:   DIET:  Regular diet  DISCHARGE CONDITION:  Stable  ACTIVITY:  Activity as tolerated  OXYGEN:  Home Oxygen: No.   Oxygen Delivery: room air  DISCHARGE LOCATION:  home   If you experience worsening of your admission symptoms, develop shortness of breath, life threatening emergency, suicidal or homicidal thoughts you must seek medical attention immediately by calling 911 or calling your MD immediately  if symptoms less severe.  You Must read complete instructions/literature along with all the possible adverse reactions/side effects for all the Medicines you take and that have been prescribed to you. Take any new Medicines after you have completely understood and accpet all the possible adverse reactions/side effects.   Please note  You were cared for by a hospitalist during your hospital stay. If you have any questions about your discharge medications or the care you received while you were in the hospital after you are discharged, you can call the unit and asked to speak with the hospitalist on call if the hospitalist that took care of you is not available. Once you are  discharged, your primary care physician will handle any further medical issues. Please note that NO REFILLS for any discharge medications will be authorized once you are discharged, as it is imperative that you return to your primary care physician (or establish a relationship with a primary care physician if you do not have one) for your aftercare needs so that they can reassess your need for medications and monitor your lab  values.     Today   No acute events overnight, no further seizures.  Mental status improved since yesterday.  Patient complaining of a headache and received some Fioricet.  Will discharge home today on a higher dose of Keppra.  VITAL SIGNS:  Blood pressure 110/71, pulse 99, temperature 98.6 F (37 C), temperature source Oral, resp. rate 20, height 5\' 3"  (1.6 m), weight 102.1 kg, SpO2 100 %, unknown if currently breastfeeding.  I/O:    Intake/Output Summary (Last 24 hours) at 10/10/2018 1409 Last data filed at 10/10/2018 1045 Gross per 24 hour  Intake 480 ml  Output 2000 ml  Net -1520 ml    PHYSICAL EXAMINATION:  GENERAL:  33 y.o.-year-old patient lying in the bed with no acute distress.  EYES: Pupils equal, round, reactive to light and accommodation. No scleral icterus. Extraocular muscles intact.  HEENT: Head atraumatic, normocephalic. Oropharynx and nasopharynx clear.  NECK:  Supple, no jugular venous distention. No thyroid enlargement, no tenderness.  LUNGS: Normal breath sounds bilaterally, no wheezing, rales,rhonchi. No use of accessory muscles of respiration.  CARDIOVASCULAR: S1, S2 normal. No murmurs, rubs, or gallops.  ABDOMEN: Soft, non-tender, non-distended. Bowel sounds present. No organomegaly or mass.  EXTREMITIES: No pedal edema, cyanosis, or clubbing.  NEUROLOGIC: Cranial nerves II through XII are intact. No focal motor or sensory defecits b/l.  PSYCHIATRIC: The patient is alert and oriented x 3.  SKIN: No obvious rash, lesion, or ulcer.   DATA REVIEW:   CBC Recent Labs  Lab 10/08/18 1848  WBC 8.5  HGB 9.5*  HCT 30.7*  PLT 319    Chemistries  Recent Labs  Lab 10/08/18 1848  NA 139  K 3.8  CL 107  CO2 26  GLUCOSE 115*  BUN 10  CREATININE 1.06*  CALCIUM 8.5*  AST 21  ALT 8  ALKPHOS 52  BILITOT 0.4    Cardiac Enzymes No results for input(s): TROPONINI in the last 168 hours.   RADIOLOGY:  No results found.    Management plans  discussed with the patient, family and they are in agreement.  CODE STATUS:     Code Status Orders  (From admission, onward)         Start     Ordered   10/07/18 1237  Full code  Continuous     10/07/18 1236         TOTAL TIME TAKING CARE OF THIS PATIENT: 40 minutes.    Houston Siren M.D on 10/10/2018 at 2:09 PM  Between 7am to 6pm - Pager - [phone]  After 6pm go to [email or web address] - Therapist, [facility]  Office  [phone]  CC: Primary care physician; [facility]

## 2018-10-11 ENCOUNTER — Other Ambulatory Visit: Payer: Self-pay | Admitting: Obstetrics and Gynecology

## 2018-10-11 LAB — SURGICAL PATHOLOGY

## 2018-10-11 MED ORDER — ONDANSETRON HCL 4 MG PO TABS: 4.0000 mg | ORAL_TABLET | Freq: Three times a day (TID) | ORAL | 0 refills | Status: DC | PRN

## 2018-10-11 MED ORDER — IBUPROFEN 800 MG PO TABS: 800.0000 mg | ORAL_TABLET | Freq: Three times a day (TID) | ORAL | 1 refills | Status: DC | PRN

## 2018-10-11 MED ORDER — OXYCODONE-ACETAMINOPHEN 5-325 MG PO TABS: 1.0000 | ORAL_TABLET | Freq: Four times a day (QID) | ORAL | 0 refills | Status: DC | PRN

## 2018-10-11 NOTE — Telephone Encounter (Signed)
Spoke with pt's mother. Pt's mother stated that the pt has been vomiting every 15-20 minutes. Pt's mother stated that the pt was in a lot of pain in the abd area. Per pt's mother pt is not able to hold any food down and is just eating small amounts of food and his having a really bad headache with dizziness.

## 2018-10-11 NOTE — Telephone Encounter (Signed)
Pt called no answer LM via voicemail that Dr. Valentino Saxon sent in Ibuprofen 800 mg and Zofran 4mg  to her pharmacy [facility] in [place].

## 2018-10-13 ENCOUNTER — Telehealth: Payer: Self-pay | Admitting: Pediatrics

## 2018-10-13 NOTE — Telephone Encounter (Signed)
Patient status post laparoscopic left salpingectomy. Patient answer "wound not healing well" on EMMI call.  Patient states her belly button had been bleeding, and wanted to know if that was ok.  Patient confirms that bleeding has since stopped, and there is no discomfort.  Patient states that she has her OB office phone number but has not called to follow up.  RNCM encouraged patient to call and follow up with oncall staff.  RNCM also notified that should site starts bleeding again to call OB office, or in emergency situation to call 911.  Patient thanked [facility] for call and denied any other issues

## 2018-10-17 ENCOUNTER — Ambulatory Visit (INDEPENDENT_AMBULATORY_CARE_PROVIDER_SITE_OTHER): Payer: Medicaid Other | Admitting: Obstetrics and Gynecology

## 2018-10-17 ENCOUNTER — Other Ambulatory Visit
Admission: RE | Admit: 2018-10-17 | Discharge: 2018-10-17 | Disposition: A | Discharge status: Home / Self Care | Payer: Medicaid Other | Source: Ambulatory Visit | Attending: Obstetrics and Gynecology | Admitting: Obstetrics and Gynecology

## 2018-10-17 ENCOUNTER — Encounter: Payer: Self-pay | Admitting: Obstetrics and Gynecology

## 2018-10-17 VITALS — BP 112/73 | HR 116 | Ht 63.0 in | Wt 201.2 lb

## 2018-10-17 DIAGNOSIS — G40909 Epilepsy, unspecified, not intractable, without status epilepticus: Secondary | ICD-10-CM

## 2018-10-17 DIAGNOSIS — Z8759 Personal history of other complications of pregnancy, childbirth and the puerperium: Secondary | ICD-10-CM

## 2018-10-17 DIAGNOSIS — Z4889 Encounter for other specified surgical aftercare: Secondary | ICD-10-CM

## 2018-10-17 DIAGNOSIS — G43019 Migraine without aura, intractable, without status migrainosus: Secondary | ICD-10-CM

## 2018-10-17 LAB — HCG, QUANTITATIVE, PREGNANCY: hCG, Beta Chain, Quant, S: 6 m[IU]/mL — ABNORMAL HIGH (ref <5)

## 2018-10-17 MED ORDER — RIZATRIPTAN BENZOATE 5 MG PO TABS: 5.0000 mg | ORAL_TABLET | ORAL | 2 refills | Status: DC | PRN

## 2018-10-17 NOTE — Progress Notes (Signed)
Pt is present for follow up from ectopic. Pt stated that she has been having an extreme headache that last all day long. Pt stated that she has token percocet and ibuprofen 800 mg and neither one of them helps with the headache.

## 2018-10-17 NOTE — Progress Notes (Signed)
    OBSTETRICS/GYNECOLOGY POST-OPERATIVE CLINIC VISIT  Subjective:     Jill [name] is a 33 y.o. female who presents to the clinic 1.5 weeks status post laparoscopic left salpingectomy for ruptured ectopic pregnancy s/p methotrexate therapy. Eating a regular diet without difficulty. Bowel movements are normal with use of stool softeners. Pain is controlled without any medications.  Her postoperative was complicated but uncontrolled seizure activity, requiring increased length of stay and transfer to Hospitalist service.  Of note, patient reports that she has been experiencing daily headaches/migraines since discharge from the hospital. She has a past history of migraines (previously used Topomax for seizure control and migraine treatment). Reports NSAIDs, Excedrin, Tylenol have not helped.   The following portions of the patient's history were reviewed and updated as appropriate: allergies, current medications, past family history, past medical history, past social history, past surgical history and problem list.  Review of Systems Pertinent items noted in HPI and remainder of comprehensive ROS otherwise negative.    Objective:    BP 112/73   Pulse (!) 116   Ht 5\' 3"  (1.6 m)   Wt 201 lb 3.2 oz (91.3 kg)   BMI 35.64 kg/m  General:  alert and no distress  Abdomen: soft, bowel sounds active, non-tender  Incision:   healing well, no drainage, no erythema, no hernia, no seroma, no swelling, no dehiscence, incision well approximated    Pathology:  A. FALLOPIAN TUBE, LEFT; SALPINGECTOMY:  - DISTENTED FALLOPIAN TUBE WITH INTRALUMINAL BLOOD CLOT AND CHORIONIC  VILLI CONSISTENT WITH ECTOPIC PREGNANCY.   Assessment:   Post-operative visit S/P ectopic pregnancy  Intractable migraine without aura and without status migrainosus Seizure disorder (HCC)  Plan:   1. Continue any current medications. Discussed option of resuming Topamax (was not very effective for management of seizures,  however still could be used for her migraines. Patient notes she desires to try something different.  Will prescribe Maxalt. Also encouraged patient to reach out to her Neurologist to move her appointment up sooner for f/u of her seizures/migraines.  2. Wound care discussed. 3. Operative findings again reviewed. Pathology report discussed.  Patient to have repeat (and likely final) HCG level today.  4. Activity restrictions: none 5. Anticipated return to work: not applicable. 6. Follow up: 3 months for annual exam.     Hildred Laser, MD [facility]

## 2018-11-05 ENCOUNTER — Other Ambulatory Visit: Payer: Self-pay

## 2018-11-05 ENCOUNTER — Emergency Department
Admission: EM | Admit: 2018-11-05 | Discharge: 2018-11-05 | Disposition: A | Discharge status: Home / Self Care | Payer: Medicaid Other | Attending: Emergency Medicine | Admitting: Emergency Medicine

## 2018-11-05 DIAGNOSIS — N189 Chronic kidney disease, unspecified: Secondary | ICD-10-CM | POA: Insufficient documentation

## 2018-11-05 DIAGNOSIS — Z87891 Personal history of nicotine dependence: Secondary | ICD-10-CM | POA: Insufficient documentation

## 2018-11-05 DIAGNOSIS — Z79899 Other long term (current) drug therapy: Secondary | ICD-10-CM | POA: Insufficient documentation

## 2018-11-05 DIAGNOSIS — R569 Unspecified convulsions: Secondary | ICD-10-CM | POA: Insufficient documentation

## 2018-11-05 DIAGNOSIS — N3001 Acute cystitis with hematuria: Secondary | ICD-10-CM | POA: Diagnosis not present

## 2018-11-05 LAB — URINALYSIS, COMPLETE (UACMP) WITH MICROSCOPIC
Bilirubin Urine: NEGATIVE
Glucose, UA: NEGATIVE mg/dL
Ketones, ur: NEGATIVE mg/dL
Leukocytes,Ua: NEGATIVE
Nitrite: POSITIVE — AB
Protein, ur: NEGATIVE mg/dL
Specific Gravity, Urine: 1.006 (ref 1.005–1.030)
pH: 6 (ref 5.0–8.0)

## 2018-11-05 LAB — COMPREHENSIVE METABOLIC PANEL
ALT: 11 U/L (ref 0–44)
AST: 16 U/L (ref 15–41)
Albumin: 4 g/dL (ref 3.5–5.0)
Alkaline Phosphatase: 59 U/L (ref 38–126)
Anion gap: 7 (ref 5–15)
BUN: 8 mg/dL (ref 6–20)
CO2: 23 mmol/L (ref 22–32)
Calcium: 8.6 mg/dL — ABNORMAL LOW (ref 8.9–10.3)
Chloride: 109 mmol/L (ref 98–111)
Creatinine, Ser: 0.89 mg/dL (ref 0.44–1.00)
GFR calc Af Amer: >60 mL/min (ref >60)
GFR calc non Af Amer: >60 mL/min (ref >60)
Glucose, Bld: 100 mg/dL — ABNORMAL HIGH (ref 70–99)
Potassium: 3.8 mmol/L (ref 3.5–5.1)
Sodium: 139 mmol/L (ref 135–145)
Total Bilirubin: 0.6 mg/dL (ref 0.3–1.2)
Total Protein: 6.8 g/dL (ref 6.5–8.1)

## 2018-11-05 LAB — CBC WITH DIFFERENTIAL/PLATELET
Abs Immature Granulocytes: 0.01 10*3/uL (ref 0.00–0.07)
Basophils Absolute: 0.1 10*3/uL (ref 0.0–0.1)
Basophils Relative: 1 %
Eosinophils Absolute: 0.1 10*3/uL (ref 0.0–0.5)
Eosinophils Relative: 1 %
HCT: 34.5 % — ABNORMAL LOW (ref 36.0–46.0)
Hemoglobin: 10.8 g/dL — ABNORMAL LOW (ref 12.0–15.0)
Immature Granulocytes: 0 %
Lymphocytes Relative: 37 %
Lymphs Abs: 2.2 10*3/uL (ref 0.7–4.0)
MCH: 26.5 pg (ref 26.0–34.0)
MCHC: 31.3 g/dL (ref 30.0–36.0)
MCV: 84.6 fL (ref 80.0–100.0)
Monocytes Absolute: 0.4 10*3/uL (ref 0.1–1.0)
Monocytes Relative: 7 %
Neutro Abs: 3.2 10*3/uL (ref 1.7–7.7)
Neutrophils Relative %: 54 %
Platelets: 297 10*3/uL (ref 150–400)
RBC: 4.08 MIL/uL (ref 3.87–5.11)
RDW: 14 % (ref 11.5–15.5)
WBC: 5.9 10*3/uL (ref 4.0–10.5)
nRBC: 0 % (ref 0.0–0.2)

## 2018-11-05 LAB — HCG, QUANTITATIVE, PREGNANCY: hCG, Beta Chain, Quant, S: <1 m[IU]/mL (ref <5)

## 2018-11-05 MED ORDER — SODIUM CHLORIDE 0.9 % IV SOLN
1750.0000 mg | Freq: Once | INTRAVENOUS | Status: AC
  Administered 2018-11-05: 1750 mg via INTRAVENOUS
  Filled 2018-11-05: qty 17.5

## 2018-11-05 MED ORDER — SODIUM CHLORIDE 0.9 % IV BOLUS
1000.0000 mL | Freq: Once | INTRAVENOUS | Status: AC
  Administered 2018-11-05: 1000 mL via INTRAVENOUS

## 2018-11-05 MED ORDER — CEPHALEXIN 500 MG PO CAPS: 500.0000 mg | ORAL_CAPSULE | Freq: Three times a day (TID) | ORAL | 0 refills | Status: AC

## 2018-11-05 MED ORDER — ONDANSETRON 4 MG PO TBDP: 4.0000 mg | ORAL_TABLET | Freq: Three times a day (TID) | ORAL | 0 refills | Status: DC | PRN

## 2018-11-05 MED ORDER — ONDANSETRON HCL 4 MG/2ML IJ SOLN
4.0000 mg | Freq: Once | INTRAMUSCULAR | Status: AC
  Administered 2018-11-05: 4 mg via INTRAVENOUS
  Filled 2018-11-05: qty 2

## 2018-11-05 MED ORDER — LORAZEPAM 2 MG/ML IJ SOLN
INTRAMUSCULAR | Status: AC
  Administered 2018-11-05: 2 mg
  Filled 2018-11-05: qty 1

## 2018-11-05 MED ORDER — SODIUM CHLORIDE 0.9 % IV SOLN
1.0000 g | Freq: Once | INTRAVENOUS | Status: AC
  Administered 2018-11-05: 1 g via INTRAVENOUS
  Filled 2018-11-05: qty 10

## 2018-11-05 NOTE — ED Notes (Signed)
Pt given a change of clothes - will be discharged when dressed.

## 2018-11-05 NOTE — ED Notes (Signed)
Pt had seizure. Medicated with ativan. Pt states she urinated on herself. A&o x 3. Awaiting keppra

## 2018-11-05 NOTE — ED Notes (Signed)
RN Darl Pikes aware of pt in room. Brandy RN at bedside

## 2018-11-05 NOTE — ED Triage Notes (Addendum)
Pt comes via POV from home with c/o seizures. Pt states this is the 3rd day of shaking and she is taking her medication (Keppra).  Pt states last seizure was about a week ago. Pt states shaking in her hands. Pt states she feels like she is going to have a seizure.  Pt denies any pain. Pt states dizziness

## 2018-11-05 NOTE — ED Provider Notes (Addendum)
[facility] Emergency Department Provider Note  ____________________________________________  Time seen: Approximately 1:27 PM  I have reviewed the triage vital signs and the nursing notes.   HISTORY  Chief Complaint Seizures   HPI Jill [name] is a 33 y.o. female with a history of seizures on Keppra, anemia and headaches who presents for evaluation of seizure.  Patient reports compliance with her Keppra.  She takes 500 mg twice daily.  She had one seizure last week.  Her seizures are generalized tonic-clonic.  Over the last 3 days she reports that she has had shaking of both her arms that she has been unable to control.  She reports that she feels like she is about to have another seizure but has not had one since last week.  She reports 3 days of suprapubic sharp abdominal pain with no dysuria or hematuria, no vaginal discharge or vaginal bleeding.  She reports that she has been vomiting 2-3 times a day since the pain started.  Patient denies constipation or diarrhea, cough, congestion, fever or chills.  Patient had a laparoscopic left salpingectomy 1 month ago for an ectopic pregnancy.  Past Medical History:  Diagnosis Date  . Anemia   . Headache   . Seizures [facility])     Patient Active Problem List   Diagnosis Date Noted  . Seizures (HCC) 10/09/2018  . Ruptured ectopic pregnancy 10/07/2018  . History of ectopic pregnancy 09/23/2018  . Seizure (HCC) 09/14/2018  . Chronic kidney disease   . Episode of loss of consciousness     Past Surgical History:  Procedure Laterality Date  . CESAREAN SECTION     x4  . CESAREAN SECTION N/A 10/07/2016   Procedure: CESAREAN SECTION;  Surgeon: Suzy Bouchard, MD;  Location: [facility] ORS;  Service: Obstetrics;  Laterality: N/A;  . DIAGNOSTIC LAPAROSCOPY WITH REMOVAL OF ECTOPIC PREGNANCY Left 10/07/2018   Procedure: laparoscopic left salpingectomy;  Surgeon: Hildred Laser, MD;  Location: [facility] ORS;  Service:  Gynecology;  Laterality: Left;    Prior to Admission medications   Medication Sig Start Date End Date Taking? Authorizing Provider  ibuprofen (ADVIL,MOTRIN) 800 MG tablet Take 1 tablet (800 mg total) by mouth every 8 (eight) hours as needed. 10/11/18   Hildred Laser, MD  levETIRAcetam (KEPPRA) 500 MG tablet Take 2 tablets (1,000 mg total) by mouth 2 (two) times daily. 10/10/18 12/09/18  Houston Siren, MD  ondansetron (ZOFRAN ODT) 4 MG disintegrating tablet Take 1 tablet (4 mg total) by mouth every 8 (eight) hours as needed. 11/05/18   Nita Sickle, MD  ondansetron (ZOFRAN) 4 MG tablet Take 1 tablet (4 mg total) by mouth every 8 (eight) hours as needed for nausea or vomiting. 10/11/18   Hildred Laser, MD  oxyCODONE-acetaminophen (PERCOCET/ROXICET) 5-325 MG tablet Take 1-2 tablets by mouth every 6 (six) hours as needed. 10/11/18   Hildred Laser, MD  rizatriptan (MAXALT) 5 MG tablet Take 1 tablet (5 mg total) by mouth as needed for migraine. May repeat in 2 hours if needed 10/17/18   Hildred Laser, MD    Allergies Patient has no known allergies.  Family History  Problem Relation Age of Onset  . Healthy Mother   . Healthy Father   . Cancer Neg Hx   . Thyroid disease Neg Hx   . Diabetes Neg Hx     Social History Social History   Tobacco Use  . Smoking status: Former Games developer  . Smokeless tobacco: Never Used  Substance Use  Topics  . Alcohol use: No  . Drug use: No    Review of Systems  Constitutional: Negative for fever. Eyes: Negative for visual changes. ENT: Negative for sore throat. Neck: No neck pain  Cardiovascular: Negative for chest pain. Respiratory: Negative for shortness of breath. Gastrointestinal: + suprapubic abdominal pain, nausea, and vomiting. No diarrhea. Genitourinary: Negative for dysuria. Musculoskeletal: Negative for back pain. Skin: Negative for rash. Neurological: Negative for headaches, weakness or numbness. + seizure Psych: No SI or  HI  ____________________________________________   PHYSICAL EXAM:  VITAL SIGNS: Vitals:   11/05/18 1530 11/05/18 1600  BP:  109/78  Pulse: 85 87  Resp: (!) 27 14  Temp:    SpO2: 100% 99%   Constitutional: Alert and oriented. Well appearing and in no apparent distress. HEENT:      Head: Normocephalic and atraumatic.         Eyes: Conjunctivae are normal. Sclera is non-icteric.       Mouth/Throat: Mucous membranes are moist.       Neck: Supple with no signs of meningismus. Cardiovascular: Regular rate and rhythm. No murmurs, gallops, or rubs. 2+ symmetrical distal pulses are present in all extremities. No JVD. Respiratory: Normal respiratory effort. Lungs are clear to auscultation bilaterally. No wheezes, crackles, or rhonchi.  Gastrointestinal: Soft, suprapubic tenderness, and non distended with positive bowel sounds. No rebound or guarding. Musculoskeletal: Nontender with normal range of motion in all extremities. No edema, cyanosis, or erythema of extremities. Neurologic: Normal speech and language. Face is symmetric. Moving all extremities. No gross focal neurologic deficits are appreciated. Skin: Skin is warm, dry and intact. No rash noted. Psychiatric: Mood and affect are normal. Speech and behavior are normal.  ____________________________________________   LABS (all labs ordered are listed, but only abnormal results are displayed)  Labs Reviewed  CBC WITH DIFFERENTIAL/PLATELET - Abnormal; Notable for the following components:      Result Value   Hemoglobin 10.8 (*)    HCT 34.5 (*)    All other components within normal limits  COMPREHENSIVE METABOLIC PANEL - Abnormal; Notable for the following components:   Glucose, Bld 100 (*)    Calcium 8.6 (*)    All other components within normal limits  URINALYSIS, COMPLETE (UACMP) WITH MICROSCOPIC - Abnormal; Notable for the following components:   Color, Urine YELLOW (*)    APPearance CLEAR (*)    Hgb urine dipstick SMALL  (*)    Nitrite POSITIVE (*)    Bacteria, UA RARE (*)    All other components within normal limits  LEVETIRACETAM LEVEL - Abnormal; Notable for the following components:   Levetiracetam Lvl 7.7 (*)    All other components within normal limits  HCG, QUANTITATIVE, PREGNANCY   ____________________________________________  EKG  ED ECG REPORT I, Nita Sickle, the attending physician, personally viewed and interpreted this ECG.  Sinus tachycardia, rate of 128, normal intervals, normal axis, no ST elevations or depressions, T wave flattening in the inferior and lateral leads.  Unchanged from prior. ____________________________________________  RADIOLOGY  none   ____________________________________________   PROCEDURES  Procedure(s) performed: None Procedures Critical Care performed:  None ____________________________________________   INITIAL IMPRESSION / ASSESSMENT AND PLAN / ED COURSE   33 y.o. female with a history of seizures on Keppra, anemia and headaches who presents for evaluation of seizure.  30 minutes after arrival in the emergency room patient had a generalized tonic-clonic seizure.  Patient given 2 mg of Ativan and loaded with 20 mg/kg of IV Keppra  for concerns of possible subtherapeutic dosing in the setting of 3 days of vomiting.  She endorses compliance with her Keppra however has been vomiting for the last 3 days and having lower abdominal pain.  She is 30 days postop from left salpingectomy for an ectopic pregnancy and only started having abdominal pain 3 days ago.  She is tender to palpation over the suprapubic region with no rebound or guarding, no RLQ or LLQ ttp. UA positive for UTI. HCG negative for pregnancy. Basic labs with no evidence of sepsis, normal WBC. Patient given rocephin. Plan to dc home on keflex and f/u with PCP.  I told the patient to continue her current dose of Keppra until the levels are back.  I will contact the patient if the levels are low  to adjust her Keppra dose until she is able to establish care with a neurologist.  She has an appointment coming up in a few weeks.  Discussed return precautions for any signs of sepsis, pyelonephritis or any recurrent seizures.     As part of my medical decision making, I reviewed the following data within the electronic MEDICAL RECORD NUMBER History obtained from family, Nursing notes reviewed and incorporated, Labs reviewed , EKG interpreted , Old chart reviewed, Notes from prior ED visits and [place] Controlled Substance Database    Pertinent labs & imaging results that were available during my care of the patient were reviewed by me and considered in my medical decision making (see chart for details).    ____________________________________________   FINAL CLINICAL IMPRESSION(S) / ED DIAGNOSES  Final diagnoses:  Seizure (HCC)  Acute cystitis with hematuria      NEW MEDICATIONS STARTED DURING THIS VISIT:  ED Discharge Orders         Ordered    cephALEXin (KEFLEX) 500 MG capsule  3 times daily     11/05/18 1520    ondansetron (ZOFRAN ODT) 4 MG disintegrating tablet  Every 8 hours PRN     11/05/18 1520           Note:  This document was prepared using Dragon voice recognition software and may include unintentional dictation errors.    Don Perking, [place], MD 11/05/18 1521    Nita Sickle, MD 11/16/18 [phone]

## 2018-11-07 LAB — LEVETIRACETAM LEVEL: Levetiracetam Lvl: 7.7 ug/mL — ABNORMAL LOW (ref 10.0–40.0)

## 2018-11-10 ENCOUNTER — Telehealth: Payer: Self-pay | Admitting: Emergency Medicine

## 2018-11-10 NOTE — Telephone Encounter (Addendum)
Called cell and left message to call me.  Also tried home number--her mother answered and Jill [name] is not there.  I will wait for her to call me back.  Her keppra level is back, but I do not know who she is seeing for this.  Would like her to inform the physician she is seeing for seizures of her level.  11/14/2018--patient called and left message asking to be called back at [phone].  I called and left her a message that her level was back and in epic and her doctor should look at it.i also left my number again.

## 2018-11-20 ENCOUNTER — Other Ambulatory Visit: Payer: Self-pay

## 2018-11-20 ENCOUNTER — Emergency Department: Payer: Medicaid Other

## 2018-11-20 ENCOUNTER — Emergency Department
Admission: EM | Admit: 2018-11-20 | Discharge: 2018-11-20 | Disposition: A | Discharge status: Home / Self Care | Payer: Medicaid Other | Attending: Emergency Medicine | Admitting: Emergency Medicine

## 2018-11-20 ENCOUNTER — Encounter: Payer: Self-pay | Admitting: Emergency Medicine

## 2018-11-20 DIAGNOSIS — O009 Unspecified ectopic pregnancy without intrauterine pregnancy: Secondary | ICD-10-CM | POA: Diagnosis not present

## 2018-11-20 DIAGNOSIS — R1084 Generalized abdominal pain: Secondary | ICD-10-CM | POA: Diagnosis present

## 2018-11-20 DIAGNOSIS — R109 Unspecified abdominal pain: Secondary | ICD-10-CM

## 2018-11-20 DIAGNOSIS — Z87891 Personal history of nicotine dependence: Secondary | ICD-10-CM | POA: Insufficient documentation

## 2018-11-20 DIAGNOSIS — R569 Unspecified convulsions: Secondary | ICD-10-CM | POA: Diagnosis not present

## 2018-11-20 DIAGNOSIS — N189 Chronic kidney disease, unspecified: Secondary | ICD-10-CM | POA: Diagnosis not present

## 2018-11-20 LAB — URINE DRUG SCREEN, QUALITATIVE (ARMC ONLY)
Amphetamines, Ur Screen: NOT DETECTED
Barbiturates, Ur Screen: NOT DETECTED
Benzodiazepine, Ur Scrn: NOT DETECTED
Cannabinoid 50 Ng, Ur ~~LOC~~: NOT DETECTED
Cocaine Metabolite,Ur ~~LOC~~: NOT DETECTED
MDMA (Ecstasy)Ur Screen: NOT DETECTED
Methadone Scn, Ur: NOT DETECTED
Opiate, Ur Screen: NOT DETECTED
Phencyclidine (PCP) Ur S: NOT DETECTED
Tricyclic, Ur Screen: POSITIVE — AB

## 2018-11-20 LAB — COMPREHENSIVE METABOLIC PANEL
ALT: 18 U/L (ref 0–44)
AST: 21 U/L (ref 15–41)
Albumin: 4.1 g/dL (ref 3.5–5.0)
Alkaline Phosphatase: 69 U/L (ref 38–126)
Anion gap: 8 (ref 5–15)
BUN: 10 mg/dL (ref 6–20)
CO2: 28 mmol/L (ref 22–32)
Calcium: 9.5 mg/dL (ref 8.9–10.3)
Chloride: 105 mmol/L (ref 98–111)
Creatinine, Ser: 0.9 mg/dL (ref 0.44–1.00)
GFR calc Af Amer: >60 mL/min (ref >60)
GFR calc non Af Amer: >60 mL/min (ref >60)
Glucose, Bld: 72 mg/dL (ref 70–99)
Potassium: 4.2 mmol/L (ref 3.5–5.1)
Sodium: 141 mmol/L (ref 135–145)
Total Bilirubin: 0.5 mg/dL (ref 0.3–1.2)
Total Protein: 7.3 g/dL (ref 6.5–8.1)

## 2018-11-20 LAB — CBC
HCT: 35.8 % — ABNORMAL LOW (ref 36.0–46.0)
Hemoglobin: 11.3 g/dL — ABNORMAL LOW (ref 12.0–15.0)
MCH: 26.6 pg (ref 26.0–34.0)
MCHC: 31.6 g/dL (ref 30.0–36.0)
MCV: 84.2 fL (ref 80.0–100.0)
Platelets: 307 10*3/uL (ref 150–400)
RBC: 4.25 MIL/uL (ref 3.87–5.11)
RDW: 13.8 % (ref 11.5–15.5)
WBC: 7.3 10*3/uL (ref 4.0–10.5)
nRBC: 0 % (ref 0.0–0.2)

## 2018-11-20 LAB — WET PREP, GENITAL
Clue Cells Wet Prep HPF POC: NONE SEEN
Sperm: NONE SEEN
Trich, Wet Prep: NONE SEEN
Yeast Wet Prep HPF POC: NONE SEEN

## 2018-11-20 LAB — URINALYSIS, COMPLETE (UACMP) WITH MICROSCOPIC
Bilirubin Urine: NEGATIVE
Glucose, UA: NEGATIVE mg/dL
Ketones, ur: NEGATIVE mg/dL
Nitrite: NEGATIVE
Protein, ur: NEGATIVE mg/dL
Specific Gravity, Urine: 1.016 (ref 1.005–1.030)
pH: 8 (ref 5.0–8.0)

## 2018-11-20 LAB — POCT PREGNANCY, URINE: Preg Test, Ur: NEGATIVE

## 2018-11-20 LAB — CHLAMYDIA/NGC RT PCR (ARMC ONLY)
Chlamydia Tr: NOT DETECTED
N gonorrhoeae: NOT DETECTED

## 2018-11-20 LAB — LIPASE, BLOOD: Lipase: 30 U/L (ref 11–51)

## 2018-11-20 LAB — HCG, QUANTITATIVE, PREGNANCY: hCG, Beta Chain, Quant, S: <1 m[IU]/mL (ref <5)

## 2018-11-20 MED ORDER — IOPAMIDOL (ISOVUE-300) INJECTION 61%
30.0000 mL | Freq: Once | INTRAVENOUS | Status: AC | PRN
  Administered 2018-11-20: 30 mL via ORAL

## 2018-11-20 MED ORDER — SODIUM CHLORIDE 0.9% FLUSH: 3.0000 mL | Freq: Once | INTRAVENOUS | Status: DC

## 2018-11-20 MED ORDER — MORPHINE SULFATE (PF) 4 MG/ML IV SOLN
4.0000 mg | Freq: Once | INTRAVENOUS | Status: AC
  Administered 2018-11-20: 4 mg via INTRAVENOUS
  Filled 2018-11-20: qty 1

## 2018-11-20 MED ORDER — SODIUM CHLORIDE 0.9 % IV BOLUS: 500.0000 mL | Freq: Once | INTRAVENOUS | Status: DC

## 2018-11-20 MED ORDER — IOHEXOL 300 MG/ML  SOLN
100.0000 mL | Freq: Once | INTRAMUSCULAR | Status: AC | PRN
  Administered 2018-11-20: 100 mL via INTRAVENOUS

## 2018-11-20 MED ORDER — SODIUM CHLORIDE 0.9 % IV BOLUS
1000.0000 mL | Freq: Once | INTRAVENOUS | Status: AC
  Administered 2018-11-20: 1000 mL via INTRAVENOUS

## 2018-11-20 NOTE — ED Notes (Signed)
Pt states she vomited blood on Thursday

## 2018-11-20 NOTE — ED Notes (Signed)
Lab called and verified to run the add on pregnancy test

## 2018-11-20 NOTE — ED Provider Notes (Addendum)
[facility] Emergency Department Provider Note  ____________________________________________   I have reviewed the triage vital signs and the nursing notes. Where available I have reviewed prior notes and, if possible and indicated, outside hospital notes.    HISTORY  Chief Complaint Abdominal Pain and Fall    HPI Jill [name] is a 33 y.o. female  With a history of seizure disorder, history of recurrent abdominal pain history of recent ectopic pregnancy, thinks she is not pregnant, does not know when her last period was, presents today complaining of abdominal pain which is been there since last Wednesday or Thursday.  Patient somewhat remarkably tells me she has had not one drink of any kind of fluid in the last 5 days, not water not anything.  She also states that she is had not 1 bite of any kind of food in this time because she was hoping that her stomach pain would get better.  She states that she has had no diarrhea,.  She has a diffuse "all over L" belly pain that is not associated with any vaginal bleeding.  She states she did have a seizure last week sometime, she denies any injury from that.  The triage note stated that she had some shaking in her arms but she denies that to me.  Patient is noted to be texting with her right hand during our discussion.  She denies any dysuria urinary frequency or changes in her urine, she states the pain is a allover pain that began gradually.  She cannot further characterize it.  She does have umbilical hernia, she does not feel that it comes specifically from there. States she did have one episode of vomiting with a trace of blood in it.   Past Medical History:  Diagnosis Date  . Anemia   . Headache   . Seizures [facility])     Patient Active Problem List   Diagnosis Date Noted  . Seizures (HCC) 10/09/2018  . Ruptured ectopic pregnancy 10/07/2018  . History of ectopic pregnancy 09/23/2018  . Seizure (HCC) 09/14/2018   . Chronic kidney disease   . Episode of loss of consciousness     Past Surgical History:  Procedure Laterality Date  . CESAREAN SECTION     x4  . CESAREAN SECTION N/A 10/07/2016   Procedure: CESAREAN SECTION;  Surgeon: Suzy Bouchard, MD;  Location: [facility] ORS;  Service: Obstetrics;  Laterality: N/A;  . DIAGNOSTIC LAPAROSCOPY WITH REMOVAL OF ECTOPIC PREGNANCY Left 10/07/2018   Procedure: laparoscopic left salpingectomy;  Surgeon: Hildred Laser, MD;  Location: [facility] ORS;  Service: Gynecology;  Laterality: Left;    Prior to Admission medications   Medication Sig Start Date End Date Taking? Authorizing Provider  ibuprofen (ADVIL,MOTRIN) 800 MG tablet Take 1 tablet (800 mg total) by mouth every 8 (eight) hours as needed. 10/11/18   Hildred Laser, MD  levETIRAcetam (KEPPRA) 500 MG tablet Take 2 tablets (1,000 mg total) by mouth 2 (two) times daily. 10/10/18 12/09/18  Houston Siren, MD  ondansetron (ZOFRAN ODT) 4 MG disintegrating tablet Take 1 tablet (4 mg total) by mouth every 8 (eight) hours as needed. 11/05/18   Nita Sickle, MD  ondansetron (ZOFRAN) 4 MG tablet Take 1 tablet (4 mg total) by mouth every 8 (eight) hours as needed for nausea or vomiting. 10/11/18   Hildred Laser, MD  oxyCODONE-acetaminophen (PERCOCET/ROXICET) 5-325 MG tablet Take 1-2 tablets by mouth every 6 (six) hours as needed. 10/11/18   Hildred Laser, MD  rizatriptan (  MAXALT) 5 MG tablet Take 1 tablet (5 mg total) by mouth as needed for migraine. May repeat in 2 hours if needed 10/17/18   Hildred Laser, MD    Allergies Patient has no known allergies.  Family History  Problem Relation Age of Onset  . Healthy Mother   . Healthy Father   . Cancer Neg Hx   . Thyroid disease Neg Hx   . Diabetes Neg Hx     Social History Social History   Tobacco Use  . Smoking status: Former Games developer  . Smokeless tobacco: Never Used  Substance Use Topics  . Alcohol use: No  . Drug use: No    Review of  Systems Constitutional: No fever/chills Eyes: No visual changes. ENT: No sore throat. No stiff neck no neck pain Cardiovascular: Denies chest pain. Respiratory: Denies shortness of breath. Gastrointestinal:   no vomiting.  No diarrhea.  No constipation. Genitourinary: Negative for dysuria. Musculoskeletal: Negative lower extremity swelling Skin: Negative for rash. Neurological: Negative for severe headaches, focal weakness or numbness.   ____________________________________________   PHYSICAL EXAM:  VITAL SIGNS: ED Triage Vitals  Enc Vitals Group     BP 11/20/18 1118 125/78     Pulse Rate 11/20/18 1118 (!) 134     Resp 11/20/18 1118 16     Temp 11/20/18 1118 98.7 F (37.1 C)     Temp Source 11/20/18 1118 Oral     SpO2 11/20/18 1118 100 %     Weight 11/20/18 1119 203 lb (92.1 kg)     Height 11/20/18 1119  (1.6 m)     Head Circumference --      Peak Flow --      Pain Score 11/20/18 1119 10     Pain Loc --      Pain Edu? --      Excl. in GC? --     Constitutional: Alert and oriented. Well appearing and in no acute distress. Eyes: Conjunctivae are normal Head: Atraumatic HEENT: No congestion/rhinnorhea. Mucous membranes are moist.  Oropharynx non-erythematous Neck:   Nontender with no meningismus, no masses, no stridor Cardiovascular: Normal rate, regular rhythm. Grossly normal heart sounds.  Good peripheral circulation. Respiratory: Normal respiratory effort.  No retractions. Lungs CTAB. Abdominal: Soft and diffusely tender no guarding no rebound nonsurgical abdomen she does have a reducible umbilical hernia.. No distention. No guarding no rebound Back:  There is no focal tenderness or step off.  there is no midline tenderness there are no lesions noted. there is no CVA tenderness  Musculoskeletal: No lower extremity tenderness, no upper extremity tenderness. No joint effusions, no DVT signs strong distal pulses no edema Neurologic:  Normal speech and language. No  gross focal neurologic deficits are appreciated.  Skin:  Skin is warm, dry and intact. No rash noted. Psychiatric: Mood and affect are normal. Speech and behavior are normal.  ____________________________________________   LABS (all labs ordered are listed, but only abnormal results are displayed)  Labs Reviewed  CBC - Abnormal; Notable for the following components:      Result Value   Hemoglobin 11.3 (*)    HCT 35.8 (*)    All other components within normal limits  WET PREP, GENITAL  CHLAMYDIA/NGC RT PCR ([facility] ONLY)  LIPASE, BLOOD  COMPREHENSIVE METABOLIC PANEL  URINALYSIS, COMPLETE (UACMP) WITH MICROSCOPIC  HCG, QUANTITATIVE, PREGNANCY  URINE DRUG SCREEN, QUALITATIVE ([facility] ONLY)  POC URINE PREG, ED    Pertinent labs  results that were available during my  care of the patient were reviewed by me and considered in my medical decision making (see chart for details). ____________________________________________  EKG  I personally interpreted any EKGs ordered by me or triage Sinus tach rate 126, normal axis no acute ST elevation or depression nonspecific ST changes no acute ischemia. ____________________________________________  RADIOLOGY  Pertinent labs & imaging results that were available during my care of the patient were reviewed by me and considered in my medical decision making (see chart for details). If possible, patient and/or family made aware of any abnormal findings.  No results found. ____________________________________________    PROCEDURES  Procedure(s) performed: None  Procedures  Critical Care performed: None  ____________________________________________   INITIAL IMPRESSION / ASSESSMENT AND PLAN / ED COURSE  Pertinent labs & imaging results that were available during my care of the patient were reviewed by me and considered in my medical decision making (see chart for details).  Patient complaining of abdominal pain, there is no  evidence of ongoing seizure activity I do note with some degree of reassurance that she is able to text using her right hand which she states has been shaking.  No evidence of focal seizure at this time anyway.  She has had a seizure in the last week but there is no evidence of significant head injury fortunately.  She states she is compliant with her medications.  She does make some claims about not having had anything by mouth literally anything in 5 days however she certainly does not appear to be dehydrated, we will give her none the less IV fluid.  We will perform a pelvic exam, urine pregnancy is pending, she has not yet been able to give [place] a urine sample, I have added on a beta hCG to her blood work to make sure that this is not a recurrent ectopic pregnancy as she is still sexually active and states she is not using any kind of contraceptive.  We will obtain a pelvic exam when nursing is available to do so we will reassess.  Abdomen is tender but nonsurgical at this moment  ----------------------------------------- 4:27 PM on 11/20/2018 -----------------------------------------  Pelvic exam: Female nurse chaperone present, no external lesions noted, whitish vaginal discharge noted with no purulent discharge, no cervical motion tenderness, no adnexal tenderness or mass, there is no significant uterine tenderness or mass. No vaginal bleeding  Patient remains in no acute distress on her phone, awaiting results of testing, CT is negative blood work is reassuring not pregnant, if ultrasound is negative we will discharge.  ----------------------------------------- 4:42 PM on 11/20/2018 -----------------------------------------  Sound is reassuring patient in no acute distress tolerating p.o. with no difficulty, we have offered her treatment for possible STI but she declines.  Urinary tract infection is a possibility but there is only contaminating squamous cells and there is a culture I think would  better serve the patient on antibiotics.  She has no dysuria.  She does have diarrhea and vomiting she states  at home but she is not anything like that here, this is most likely gastroenteritis, we will discharge with return precautions follow-up given and understood.    ____________________________________________   FINAL CLINICAL IMPRESSION(S) / ED DIAGNOSES  Final diagnoses:  None      This chart was dictated using voice recognition software.  Despite best efforts to proofread,  errors can occur which can change meaning.      Jeanmarie Plant, MD 11/20/18 1300    Jeanmarie Plant, MD 11/20/18 [phone]  Jeanmarie Plant, MD 11/20/18 1643    Jeanmarie Plant, MD 11/20/18 [phone]

## 2018-11-20 NOTE — ED Triage Notes (Signed)
Pt to ED via POV c/o abdominal pain since Thursday. Pt also states that she had a seizure while in the shower on Thursday and fell and she has had a headache since then. Pt also states that she has had shaking in her right arm since Thursday. Pt is tachycardic in triage but otherwise in NAD.

## 2018-11-20 NOTE — ED Notes (Signed)
Gave pt urine with bag.

## 2018-11-20 NOTE — ED Notes (Signed)
Pt reported that she had a positive pregnancy test at home after denying this to the provider  Urine preg negative here

## 2018-11-22 LAB — URINE CULTURE

## 2018-12-19 ENCOUNTER — Encounter: Payer: Medicaid Other | Admitting: Obstetrics and Gynecology

## 2019-02-23 ENCOUNTER — Encounter: Payer: Medicaid Other | Admitting: Obstetrics and Gynecology

## 2019-04-13 ENCOUNTER — Other Ambulatory Visit: Payer: Self-pay

## 2019-04-13 ENCOUNTER — Encounter: Payer: Self-pay | Admitting: Emergency Medicine

## 2019-04-13 ENCOUNTER — Emergency Department
Admission: EM | Admit: 2019-04-13 | Discharge: 2019-04-14 | Disposition: A | Discharge status: Home / Self Care | Payer: Medicaid Other | Attending: Emergency Medicine | Admitting: Emergency Medicine

## 2019-04-13 ENCOUNTER — Emergency Department: Payer: Medicaid Other

## 2019-04-13 DIAGNOSIS — Z87891 Personal history of nicotine dependence: Secondary | ICD-10-CM | POA: Diagnosis not present

## 2019-04-13 DIAGNOSIS — R569 Unspecified convulsions: Secondary | ICD-10-CM | POA: Diagnosis not present

## 2019-04-13 DIAGNOSIS — G43809 Other migraine, not intractable, without status migrainosus: Secondary | ICD-10-CM | POA: Insufficient documentation

## 2019-04-13 LAB — URINALYSIS, COMPLETE (UACMP) WITH MICROSCOPIC
Bilirubin Urine: NEGATIVE
Glucose, UA: NEGATIVE mg/dL
Ketones, ur: NEGATIVE mg/dL
Leukocytes,Ua: NEGATIVE
Nitrite: NEGATIVE
Protein, ur: NEGATIVE mg/dL
Specific Gravity, Urine: 1.016 (ref 1.005–1.030)
pH: 7 (ref 5.0–8.0)

## 2019-04-13 LAB — URINE DRUG SCREEN, QUALITATIVE (ARMC ONLY)
Amphetamines, Ur Screen: NOT DETECTED
Barbiturates, Ur Screen: NOT DETECTED
Benzodiazepine, Ur Scrn: NOT DETECTED
Cannabinoid 50 Ng, Ur ~~LOC~~: NOT DETECTED
Cocaine Metabolite,Ur ~~LOC~~: NOT DETECTED
MDMA (Ecstasy)Ur Screen: NOT DETECTED
Methadone Scn, Ur: NOT DETECTED
Opiate, Ur Screen: NOT DETECTED
Phencyclidine (PCP) Ur S: NOT DETECTED
Tricyclic, Ur Screen: POSITIVE — AB

## 2019-04-13 LAB — CBC
HCT: 34.5 % — ABNORMAL LOW (ref 36.0–46.0)
Hemoglobin: 11 g/dL — ABNORMAL LOW (ref 12.0–15.0)
MCH: 26.7 pg (ref 26.0–34.0)
MCHC: 31.9 g/dL (ref 30.0–36.0)
MCV: 83.7 fL (ref 80.0–100.0)
Platelets: 304 10*3/uL (ref 150–400)
RBC: 4.12 MIL/uL (ref 3.87–5.11)
RDW: 13.9 % (ref 11.5–15.5)
WBC: 6.4 10*3/uL (ref 4.0–10.5)
nRBC: 0 % (ref 0.0–0.2)

## 2019-04-13 LAB — COMPREHENSIVE METABOLIC PANEL
ALT: 11 U/L (ref 0–44)
AST: 16 U/L (ref 15–41)
Albumin: 4 g/dL (ref 3.5–5.0)
Alkaline Phosphatase: 71 U/L (ref 38–126)
Anion gap: 8 (ref 5–15)
BUN: 8 mg/dL (ref 6–20)
CO2: 25 mmol/L (ref 22–32)
Calcium: 9 mg/dL (ref 8.9–10.3)
Chloride: 107 mmol/L (ref 98–111)
Creatinine, Ser: 1.03 mg/dL — ABNORMAL HIGH (ref 0.44–1.00)
GFR calc Af Amer: >60 mL/min (ref >60)
GFR calc non Af Amer: >60 mL/min (ref >60)
Glucose, Bld: 93 mg/dL (ref 70–99)
Potassium: 3.9 mmol/L (ref 3.5–5.1)
Sodium: 140 mmol/L (ref 135–145)
Total Bilirubin: 0.6 mg/dL (ref 0.3–1.2)
Total Protein: 7.2 g/dL (ref 6.5–8.1)

## 2019-04-13 LAB — POCT PREGNANCY, URINE: Preg Test, Ur: NEGATIVE

## 2019-04-13 MED ORDER — DIPHENHYDRAMINE HCL 50 MG/ML IJ SOLN
50.0000 mg | Freq: Once | INTRAMUSCULAR | Status: AC
  Administered 2019-04-13: 23:00:00 50 mg via INTRAVENOUS
  Filled 2019-04-13: qty 1

## 2019-04-13 MED ORDER — ACETAMINOPHEN 500 MG PO TABS
ORAL_TABLET | ORAL | Status: AC
  Filled 2019-04-13: qty 2

## 2019-04-13 MED ORDER — ACETAMINOPHEN 500 MG PO TABS
1000.0000 mg | ORAL_TABLET | Freq: Once | ORAL | Status: AC
  Administered 2019-04-13: 20:00:00 1000 mg via ORAL

## 2019-04-13 MED ORDER — KETOROLAC TROMETHAMINE 30 MG/ML IJ SOLN
30.0000 mg | Freq: Once | INTRAMUSCULAR | Status: AC
  Administered 2019-04-13: 23:00:00 30 mg via INTRAVENOUS
  Filled 2019-04-13: qty 1

## 2019-04-13 MED ORDER — METOCLOPRAMIDE HCL 5 MG/ML IJ SOLN
10.0000 mg | Freq: Once | INTRAMUSCULAR | Status: AC
  Administered 2019-04-13: 23:00:00 10 mg via INTRAVENOUS
  Filled 2019-04-13: qty 2

## 2019-04-13 MED ORDER — SODIUM CHLORIDE 0.9 % IV BOLUS
1000.0000 mL | Freq: Once | INTRAVENOUS | Status: AC
  Administered 2019-04-13: 1000 mL via INTRAVENOUS

## 2019-04-13 MED ORDER — LORAZEPAM 2 MG/ML IJ SOLN
1.0000 mg | Freq: Once | INTRAMUSCULAR | Status: AC
  Administered 2019-04-13: 21:00:00 1 mg via INTRAVENOUS

## 2019-04-13 MED ORDER — LORAZEPAM 2 MG/ML IJ SOLN
INTRAMUSCULAR | Status: AC
  Filled 2019-04-13: qty 1

## 2019-04-13 MED ORDER — LEVETIRACETAM IN NACL 1000 MG/100ML IV SOLN
1000.0000 mg | Freq: Once | INTRAVENOUS | Status: AC
  Administered 2019-04-13: 19:00:00 1000 mg via INTRAVENOUS
  Filled 2019-04-13: qty 100

## 2019-04-13 NOTE — ED Notes (Addendum)
During triage, this nurse witnessed pt started to shake. Suctioned minimal secretions from the mouth. Dr. Kerman Passey at bedside. Order received. Pt was able to respond immediately after episode.

## 2019-04-13 NOTE — ED Notes (Signed)
MD Williams at bedside.  

## 2019-04-13 NOTE — ED Notes (Addendum)
Mother [phone] Makisha Marrin would like updates on patient.

## 2019-04-13 NOTE — ED Triage Notes (Signed)
Pt arrived via EMS from home. Per mother pt had a seizure in the bathroom that lasted minutes. Per EMS pt had two episodes of "shaking" but was able to respond immediately after. Hx of epilepsy and takes [place]. Pt also c/o of a headache at this time

## 2019-04-13 NOTE — ED Notes (Signed)
Pt given a phone at this time in order to call her mother.

## 2019-04-13 NOTE — ED Notes (Signed)
Seizure precautions initiated.

## 2019-04-13 NOTE — ED Notes (Signed)
Pt to CT at this time.

## 2019-04-13 NOTE — ED Provider Notes (Signed)
[facility] Emergency Department Provider Note  Time seen: 7:11 PM  I have reviewed the triage vital signs and the nursing notes.   HISTORY  Chief Complaint Seizures   HPI Jill [name] is a 33 y.o. female with a past medical history of anemia, epilepsy on Keppra, presents to the emergency department after a seizure.   According to EMS report per the patient's mother patient had a tonic-clonic seizure in the bathroom lasting several minutes.  Patient had an episode of shaking with EMS but no postictal period was able to respond immediately afterwards.  Patient had another episode of shaking in the emergency department once again no postictal.  Was able to answer my questions immediately afterwards.  Patient is supposed be taking 1000 mg of Keppra twice daily, states she has not been taking it recently.  States mild headache currently.  Overall the patient appears well, no acute distress.  Denies any recent fever cough congestion or shortness of breath.  Denies any abdominal pain.  Past Medical History:  Diagnosis Date  . Anemia   . Headache   . Seizures [facility])     Patient Active Problem List   Diagnosis Date Noted  . Seizures ([place]) 10/09/2018  . Ruptured ectopic pregnancy 10/07/2018  . History of ectopic pregnancy 09/23/2018  . Seizure ([place]) 09/14/2018  . Chronic kidney disease   . Episode of loss of consciousness     Past Surgical History:  Procedure Laterality Date  . CESAREAN SECTION     x4  . CESAREAN SECTION N/A 10/07/2016   Procedure: CESAREAN SECTION;  Surgeon: Boykin Nearing, MD;  Location: [facility] ORS;  Service: Obstetrics;  Laterality: N/A;  . DIAGNOSTIC LAPAROSCOPY WITH REMOVAL OF ECTOPIC PREGNANCY Left 10/07/2018   Procedure: laparoscopic left salpingectomy;  Surgeon: Rubie Maid, MD;  Location: [facility] ORS;  Service: Gynecology;  Laterality: Left;    Prior to Admission medications   Medication Sig Start Date End Date Taking?  Authorizing Provider  ibuprofen (ADVIL,MOTRIN) 800 MG tablet Take 1 tablet (800 mg total) by mouth every 8 (eight) hours as needed. 10/11/18   Rubie Maid, MD  levETIRAcetam (KEPPRA) 500 MG tablet Take 2 tablets (1,000 mg total) by mouth 2 (two) times daily. 10/10/18 12/09/18  Henreitta Leber, MD  ondansetron (ZOFRAN ODT) 4 MG disintegrating tablet Take 1 tablet (4 mg total) by mouth every 8 (eight) hours as needed. 11/05/18   Rudene Re, MD  ondansetron (ZOFRAN) 4 MG tablet Take 1 tablet (4 mg total) by mouth every 8 (eight) hours as needed for nausea or vomiting. 10/11/18   Rubie Maid, MD  oxyCODONE-acetaminophen (PERCOCET/ROXICET) 5-325 MG tablet Take 1-2 tablets by mouth every 6 (six) hours as needed. 10/11/18   Rubie Maid, MD  rizatriptan (MAXALT) 5 MG tablet Take 1 tablet (5 mg total) by mouth as needed for migraine. May repeat in 2 hours if needed 10/17/18   Rubie Maid, MD    No Known Allergies  Family History  Problem Relation Age of Onset  . Healthy Mother   . Healthy Father   . Cancer Neg Hx   . Thyroid disease Neg Hx   . Diabetes Neg Hx     Social History Social History   Tobacco Use  . Smoking status: Former Research scientist (life sciences)  . Smokeless tobacco: Never Used  Substance Use Topics  . Alcohol use: No  . Drug use: No    Review of Systems Constitutional: Negative for fever. ENT: Negative for recent illness/congestion  Cardiovascular: Negative for chest pain. Respiratory: Negative for shortness of breath.  Negative for cough. Gastrointestinal: Negative for abdominal pain Musculoskeletal: Negative for musculoskeletal complaints Skin: Negative for skin complaints  Neurological: Mild headache All other ROS negative  ____________________________________________   PHYSICAL EXAM:  VITAL SIGNS: ED Triage Vitals  Enc Vitals Group     BP 04/13/19 1837 138/78     Pulse Rate 04/13/19 1837 (!) 130     Resp 04/13/19 1837 12     Temp 04/13/19 1837 99 F (37.2 C)      Temp src --      SpO2 04/13/19 1837 99 %     Weight 04/13/19 1838 210 lb (95.3 kg)     Height 04/13/19 1838 5\' 3"  (1.6 m)     Head Circumference --      Peak Flow --      Pain Score 04/13/19 1838 10     Pain Loc --      Pain Edu? --      Excl. in GC? --    Constitutional: Alert and oriented. Well appearing and in no distress. Eyes: Normal exam ENT      Head: Normocephalic and atraumatic.      Mouth/Throat: Mucous membranes are moist. Cardiovascular: Normal rate, regular rhythm.  Respiratory: Normal respiratory effort without tachypnea nor retractions. Breath sounds are clear  Gastrointestinal: Soft and nontender. No distention.   Musculoskeletal: Nontender with normal range of motion in all extremities. Neurologic:  Normal speech and language. No gross focal neurologic deficits Skin:  Skin is warm, dry and intact.  Psychiatric: Mood and affect are normal.  ____________________________________________   INITIAL IMPRESSION / ASSESSMENT AND PLAN / ED COURSE  Pertinent labs & imaging results that were available during my care of the patient were reviewed by me and considered in my medical decision making (see chart for details).   Patient presents emergency department after seizure-like activity at home.  Seizure-like activity in the emergency department however no postictal period,  Did not appear consistent with a tonic-clonic seizure in the emergency department, witnessed by myself.  Differential would include seizure, seizure-like activity.  We will check labs loaded with Keppra which the patient admits to noncompliance recently.  We will continue to closely monitor while awaiting results.  Patient's work-up has been largely nonrevealing.  Lab work is largely within normal limits.  Patient states she is having a migraine for the past 1 week, states this is very typical to have migraines.  We will dose a migraine cocktail and monitor in the emergency department.  Given the patient's  headache and seizure-like activity we will perform a CT scan of the head as a precaution as well.  Patient care signed out to oncoming physician.   Jill [name] was evaluated in Emergency Department on 04/13/2019 for the symptoms described in the history of present illness. She was evaluated in the context of the global COVID-19 pandemic, which necessitated consideration that the patient might be at risk for infection with the SARS-CoV-2 virus that causes COVID-19. Institutional protocols and algorithms that pertain to the evaluation of patients at risk for COVID-19 are in a state of rapid change based on information released by regulatory bodies including the CDC and federal and state organizations. These policies and algorithms were followed during the patient's care in the ED.  ____________________________________________   FINAL CLINICAL IMPRESSION(S) / ED DIAGNOSES  Seizure like activity   Minna AntisPaduchowski, Tracee Mccreery, MD 04/13/19 2307

## 2019-04-13 NOTE — ED Notes (Signed)
Pt asleep in room at this time with snoring respirations.

## 2019-04-14 NOTE — ED Notes (Signed)
Mother is on the way for pt.

## 2019-04-14 NOTE — ED Notes (Signed)
Pt sleeping in room at this time.

## 2019-05-18 ENCOUNTER — Emergency Department
Admission: EM | Admit: 2019-05-18 | Discharge: 2019-05-18 | Disposition: A | Discharge status: Home / Self Care | Payer: Medicaid Other | Attending: Emergency Medicine | Admitting: Emergency Medicine

## 2019-05-18 ENCOUNTER — Emergency Department: Payer: Medicaid Other

## 2019-05-18 ENCOUNTER — Other Ambulatory Visit: Payer: Self-pay

## 2019-05-18 ENCOUNTER — Encounter: Payer: Self-pay | Admitting: Emergency Medicine

## 2019-05-18 DIAGNOSIS — R079 Chest pain, unspecified: Secondary | ICD-10-CM | POA: Diagnosis present

## 2019-05-18 DIAGNOSIS — Z87891 Personal history of nicotine dependence: Secondary | ICD-10-CM | POA: Insufficient documentation

## 2019-05-18 DIAGNOSIS — Y939 Activity, unspecified: Secondary | ICD-10-CM | POA: Diagnosis not present

## 2019-05-18 DIAGNOSIS — X58XXXA Exposure to other specified factors, initial encounter: Secondary | ICD-10-CM | POA: Diagnosis not present

## 2019-05-18 DIAGNOSIS — T148XXA Other injury of unspecified body region, initial encounter: Secondary | ICD-10-CM

## 2019-05-18 DIAGNOSIS — Y999 Unspecified external cause status: Secondary | ICD-10-CM | POA: Insufficient documentation

## 2019-05-18 DIAGNOSIS — Y9259 Other trade areas as the place of occurrence of the external cause: Secondary | ICD-10-CM | POA: Insufficient documentation

## 2019-05-18 DIAGNOSIS — T300 Burn of unspecified body region, unspecified degree: Secondary | ICD-10-CM

## 2019-05-18 DIAGNOSIS — M79602 Pain in left arm: Secondary | ICD-10-CM

## 2019-05-18 DIAGNOSIS — T754XXA Electrocution, initial encounter: Secondary | ICD-10-CM

## 2019-05-18 DIAGNOSIS — S56912A Strain of unspecified muscles, fascia and tendons at forearm level, left arm, initial encounter: Secondary | ICD-10-CM | POA: Diagnosis not present

## 2019-05-18 LAB — CBC
HCT: 35 % — ABNORMAL LOW (ref 36.0–46.0)
Hemoglobin: 11 g/dL — ABNORMAL LOW (ref 12.0–15.0)
MCH: 26.4 pg (ref 26.0–34.0)
MCHC: 31.4 g/dL (ref 30.0–36.0)
MCV: 84.1 fL (ref 80.0–100.0)
Platelets: 313 10*3/uL (ref 150–400)
RBC: 4.16 MIL/uL (ref 3.87–5.11)
RDW: 14 % (ref 11.5–15.5)
WBC: 5.9 10*3/uL (ref 4.0–10.5)
nRBC: 0 % (ref 0.0–0.2)

## 2019-05-18 LAB — BASIC METABOLIC PANEL
Anion gap: 6 (ref 5–15)
BUN: 9 mg/dL (ref 6–20)
CO2: 27 mmol/L (ref 22–32)
Calcium: 9.3 mg/dL (ref 8.9–10.3)
Chloride: 107 mmol/L (ref 98–111)
Creatinine, Ser: 0.94 mg/dL (ref 0.44–1.00)
GFR calc Af Amer: >60 mL/min (ref >60)
GFR calc non Af Amer: >60 mL/min (ref >60)
Glucose, Bld: 84 mg/dL (ref 70–99)
Potassium: 4.1 mmol/L (ref 3.5–5.1)
Sodium: 140 mmol/L (ref 135–145)

## 2019-05-18 LAB — TROPONIN I (HIGH SENSITIVITY): Troponin I (High Sensitivity): <2 ng/L (ref <18)

## 2019-05-18 LAB — CK: Total CK: 66 U/L (ref 38–234)

## 2019-05-18 LAB — PREGNANCY, URINE: Preg Test, Ur: NEGATIVE

## 2019-05-18 MED ORDER — LEVETIRACETAM 500 MG PO TABS
500.0000 mg | ORAL_TABLET | Freq: Once | ORAL | Status: AC
  Administered 2019-05-18: 500 mg via ORAL
  Filled 2019-05-18: qty 1

## 2019-05-18 MED ORDER — NAPROXEN 375 MG PO TABS: 375.0000 mg | ORAL_TABLET | Freq: Two times a day (BID) | ORAL | 0 refills | Status: AC

## 2019-05-18 MED ORDER — CYCLOBENZAPRINE HCL 5 MG PO TABS: 5.0000 mg | ORAL_TABLET | Freq: Three times a day (TID) | ORAL | 0 refills | Status: DC | PRN

## 2019-05-18 MED ORDER — HYDROCODONE-ACETAMINOPHEN 5-325 MG PO TABS
2.0000 | ORAL_TABLET | Freq: Once | ORAL | Status: AC
  Administered 2019-05-18: 2 via ORAL
  Filled 2019-05-18: qty 2

## 2019-05-18 MED ORDER — KETOROLAC TROMETHAMINE 60 MG/2ML IM SOLN
60.0000 mg | Freq: Once | INTRAMUSCULAR | Status: AC
  Administered 2019-05-18: 60 mg via INTRAMUSCULAR
  Filled 2019-05-18: qty 2

## 2019-05-18 MED ORDER — HYDROCODONE-ACETAMINOPHEN 5-325 MG PO TABS: 1.0000 | ORAL_TABLET | ORAL | 0 refills | Status: DC | PRN

## 2019-05-18 MED ORDER — CYCLOBENZAPRINE HCL 10 MG PO TABS
5.0000 mg | ORAL_TABLET | Freq: Once | ORAL | Status: AC
  Administered 2019-05-18: 5 mg via ORAL
  Filled 2019-05-18: qty 1

## 2019-05-18 NOTE — ED Notes (Signed)
Called in to pts room for left leg pain, burning, 10/10. Pulses equal bilateral. Pt denies hx of same or injury. Pt had just been given pain med for pain in different area. Dr Ellender Hose notified.
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
## 2019-05-18 NOTE — Discharge Instructions (Signed)
Wear the sling for comfort, but make sure you take it off and move your shoulder and arm at least once every 2 hours, and do not wear for more than 3 days. This is for comfort only.  Keep your arm ELEVATED when resting and as often as possible throughout the day.

## 2019-09-04 ENCOUNTER — Other Ambulatory Visit: Payer: Self-pay

## 2019-09-04 ENCOUNTER — Encounter: Payer: Self-pay | Admitting: Surgery

## 2019-09-04 ENCOUNTER — Ambulatory Visit (INDEPENDENT_AMBULATORY_CARE_PROVIDER_SITE_OTHER): Payer: Medicaid Other | Admitting: Surgery

## 2019-09-04 VITALS — BP 115/81 | HR 98 | Temp 97.7°F | Ht 63.0 in | Wt 212.0 lb

## 2019-09-04 DIAGNOSIS — K429 Umbilical hernia without obstruction or gangrene: Secondary | ICD-10-CM

## 2019-09-04 NOTE — Patient Instructions (Addendum)
You are scheduled to see Dr Manuella Ghazi at [facility] on 09/05/19 at 11:15 am.   You will follow up with [place] here about 3 weeks after that.

## 2019-09-05 ENCOUNTER — Encounter: Payer: Self-pay | Admitting: Surgery

## 2019-09-05 NOTE — Progress Notes (Signed)
09/04/2019  Reason for Visit:  Umbilical hernia  Referring Provider:  Kerri Perches, PA-C  History of Present Illness: Jill [name] is a 34 y.o. female presenting with a symptomatic umbilical hernia.  Patient reports that she's had the hernia for at least a year, and believes that she's had a prior umbilical hernia repair during one of her laparoscopic surgeries in the past.  She has had multiple C-sections and reports she's had two laparoscopic surgeries for ectopic pregnancy, with the most recent done on 10/07/18.  She reports that the hernia is painful and there is constant discomfort.  She reports that last week she had bleeding from the umbilical area.  Denies any fevers, chills, redness of the skin, or induration.  However, she does report nausea and vomiting due to the pain.  She also has a history of seizures, and has been seen in the ED at least 4 times this year and has been hospitalized as well for seizures.  She takes Keppra.  She saw Dr. Manuella Ghazi in March 2020 at which point she was also started on Topamax.  The patient reports that she did not like how it made her feel and stopped the medication.  She continues with the Keppra, and her prescription currently is the one she got on her last hospitalization.  She has otherwise not followed up with Neurology since and is unclear to me who actually manages her seizures.  She reports that she has seizures probably every other day and they vary from "quiet" ones where she is still and unable to move, and "out" ones where she has shaking movements.    Past Medical History: Past Medical History:  Diagnosis Date  . Anemia   . Headache    migrains  . Seizures ([place])      Past Surgical History: Past Surgical History:  Procedure Laterality Date  . CESAREAN SECTION     x4  . CESAREAN SECTION N/A 10/07/2016   Procedure: CESAREAN SECTION;  Surgeon: Boykin Nearing, MD;  Location: [facility] ORS;  Service: Obstetrics;  Laterality: N/A;  .  DIAGNOSTIC LAPAROSCOPY WITH REMOVAL OF ECTOPIC PREGNANCY Left 10/07/2018   Procedure: laparoscopic left salpingectomy;  Surgeon: Rubie Maid, MD;  Location: [facility] ORS;  Service: Gynecology;  Laterality: Left;  . HERNIA REPAIR     umbilical x 2    Home Medications: Prior to Admission medications   Medication Sig Start Date End Date Taking? Authorizing Provider  butalbital-acetaminophen-caffeine (FIORICET) 50-325-40 MG tablet Take 1 tablet by mouth as needed for headache or migraine.  04/04/19  Yes [provider]  cyclobenzaprine (FLEXERIL) 5 MG tablet Take 1 tablet (5 mg total) by mouth 3 (three) times daily as needed for muscle spasms. 05/18/19  Yes Duffy Bruce, MD  HYDROcodone-acetaminophen (NORCO/VICODIN) 5-325 MG tablet Take 1 tablet by mouth every 4 (four) hours as needed for severe pain. 05/18/19 05/17/20 Yes Duffy Bruce, MD  rizatriptan (MAXALT) 5 MG tablet Take 1 tablet (5 mg total) by mouth as needed for migraine. May repeat in 2 hours if needed 10/17/18  Yes Rubie Maid, MD  SUMAtriptan (IMITREX) 100 MG tablet TK 1 T PO ONCE PRF MIGRAINE FOR UP TO 1 DOSE. MAY TK 1 T AFTER 2 H IF NEEDED. 12/06/18  Yes [provider]  levETIRAcetam (KEPPRA) 500 MG tablet Take 2 tablets (1,000 mg total) by mouth 2 (two) times daily. 10/10/18 04/13/19  Henreitta Leber, MD    Allergies: No Known Allergies  Social History:  reports  that she has quit smoking. She has never used smokeless tobacco. She reports that she does not drink alcohol or use drugs.   Family History: Family History  Problem Relation Age of Onset  . Healthy Mother   . Healthy Father   . Cancer Neg Hx   . Thyroid disease Neg Hx   . Diabetes Neg Hx     Review of Systems: Review of Systems  Constitutional: Negative for chills and fever.  HENT: Negative for hearing loss.   Respiratory: Negative for shortness of breath.   Cardiovascular: Negative for chest pain.  Gastrointestinal: Positive for abdominal pain,  nausea and vomiting. Negative for constipation and diarrhea.  Genitourinary: Negative for dysuria.  Musculoskeletal: Negative for myalgias.  Skin: Negative for rash.  Neurological: Positive for seizures.  Psychiatric/Behavioral: Negative for depression.    Physical Exam BP 115/81   Pulse 98   Temp 97.7 F (36.5 C)   Ht 5\' 3"  (1.6 m)   Wt 96.2 kg   LMP 08/30/2019 (Exact Date)   SpO2 98%   BMI 37.55 kg/m  CONSTITUTIONAL: No acute distress HEENT:  Normocephalic, atraumatic, extraocular motion intact. NECK: Trachea is midline, and there is no jugular venous distension.  RESPIRATORY:  Lungs are clear, and breath sounds are equal bilaterally. Normal respiratory effort without pathologic use of accessory muscles. CARDIOVASCULAR: Heart is regular without murmurs, gallops, or rubs. GI: The abdomen is soft, obese, non-distended, with tenderness to palpation in the umbilical area.  She has a 1.5 cm umbilical hernia defect, which is actually easily reducible, without any overlying skin ulceration.  She has well healed laparoscopic incisions and c-section incisions. MUSCULOSKELETAL:  Normal muscle strength and tone in all four extremities.  No peripheral edema or cyanosis. SKIN: Skin turgor is normal. There are no pathologic skin lesions.  NEUROLOGIC:  Motor and sensation is grossly normal.  Cranial nerves are grossly intact. PSYCH:  Alert and oriented to person, place and time. Affect is normal.  Laboratory Analysis: Last Keppra level on 11/05/18:  7.7   Imaging: CT head 04/13/19: IMPRESSION: No acute intracranial abnormality   Assessment and Plan: [name] with symptomatic umbilical hernia, but also with complicating history of uncontrolled seizures.  --Discussed with the patient that currently her seizures are not well controlled, and it is not normal to be having seizures so often.  Our office has called Dr. 38 office and schedule her an appointment for follow up  tomorrow 09/05/19 at 11:15 am.   --Discussed with her the potential dangers of surgery in the setting of uncontrolled seizures and the potential for brain injury.  At this point, we would absolutely need her seizures to be better controlled and neurology clearance prior to any surgery.  I anticipate the anesthesia team would also want this.  Once her seizures are better controlled, I discussed with her that we can definitely help her with the umbilical hernia and proceed with umbilical hernia repair.  For now, at least, her hernia is easily reducible and there is no evidence of strangulation or overlying ulceration.   --We will see her back in three weeks to assess her progress.  Face-to-face time spent with the patient and care providers was 60 minutes, with more than 50% of the time spent counseling, educating, and coordinating care of the patient.     09/07/19, MD [facility]

## 2019-09-17 ENCOUNTER — Ambulatory Visit: Payer: Medicaid Other

## 2019-09-26 ENCOUNTER — Telehealth: Payer: Self-pay | Admitting: Emergency Medicine

## 2019-09-26 ENCOUNTER — Encounter: Payer: Self-pay | Admitting: Surgery

## 2019-09-26 ENCOUNTER — Other Ambulatory Visit: Payer: Self-pay

## 2019-09-26 ENCOUNTER — Encounter: Payer: Self-pay | Admitting: Emergency Medicine

## 2019-09-26 ENCOUNTER — Ambulatory Visit (INDEPENDENT_AMBULATORY_CARE_PROVIDER_SITE_OTHER): Payer: Medicaid Other | Admitting: Surgery

## 2019-09-26 VITALS — BP 123/81 | HR 108 | Temp 97.9°F | Resp 12 | Ht 63.0 in | Wt 215.8 lb

## 2019-09-26 DIAGNOSIS — K429 Umbilical hernia without obstruction or gangrene: Secondary | ICD-10-CM | POA: Diagnosis not present

## 2019-09-26 NOTE — Telephone Encounter (Signed)
Neurology Clearance sent to Dr. Sherryll Burger.  Fax : [phone]

## 2019-09-26 NOTE — Patient Instructions (Addendum)
Our surgery scheduler will contact you within 24-48 hours to schedule your surgery. Please have the [facility] Sheet available when she calls you.   Neurology Clearance sent to Dr. Sherryll Burger.  Umbilical Hernia, Adult  A hernia is a bulge of tissue that pushes through an opening between muscles. An umbilical hernia happens in the abdomen, near the belly button (umbilicus). The hernia may contain tissues from the small intestine, large intestine, or fatty tissue covering the intestines (omentum). Umbilical hernias in adults tend to get worse over time, and they require surgical treatment. There are several types of umbilical hernias. You may have:  A hernia located just above or below the umbilicus (indirect hernia). This is the most common type of umbilical hernia in adults.  A hernia that forms through an opening formed by the umbilicus (direct hernia).  A hernia that comes and goes (reducible hernia). A reducible hernia may be visible only when you strain, lift something heavy, or cough. This type of hernia can be pushed back into the abdomen (reduced).  A hernia that traps abdominal tissue inside the hernia (incarcerated hernia). This type of hernia cannot be reduced.  A hernia that cuts off blood flow to the tissues inside the hernia (strangulated hernia). The tissues can start to die if this happens. This type of hernia requires emergency treatment. What are the causes? An umbilical hernia happens when tissue inside the abdomen presses on a weak area of the abdominal muscles. What increases the risk? You may have a greater risk of this condition if you:  Are obese.  Have had several pregnancies.  Have a buildup of fluid inside your abdomen (ascites).  Have had surgery that weakens the abdominal muscles. What are the signs or symptoms? The main symptom of this condition is a painless bulge at or near the belly button. A reducible hernia may be visible only when you strain, lift something  heavy, or cough. Other symptoms may include:  Dull pain.  A feeling of pressure. Symptoms of a strangulated hernia may include:  Pain that gets increasingly worse.  Nausea and vomiting.  Pain when pressing on the hernia.  Skin over the hernia becoming red or purple.  Constipation.  Blood in the stool. How is this diagnosed? This condition may be diagnosed based on:  A physical exam. You may be asked to cough or strain while standing. These actions increase the pressure inside your abdomen and force the hernia through the opening in your muscles. Your health care provider may try to reduce the hernia by pressing on it.  Your symptoms and medical history. How is this treated? Surgery is the only treatment for an umbilical hernia. Surgery for a strangulated hernia is done as soon as possible. If you have a small hernia that is not incarcerated, you may need to lose weight before having surgery. Follow these instructions at home:  Lose weight, if told by your health care provider.  Do not try to push the hernia back in.  Watch your hernia for any changes in color or size. Tell your health care provider if any changes occur.  You may need to avoid activities that increase pressure on your hernia.  Do not lift anything that is heavier than 10 lb (4.5 kg) until your health care provider says that this is safe.  Take over-the-counter and prescription medicines only as told by your health care provider.  Keep all follow-up visits as told by your health care provider. This is important. Contact  a health care provider if:  Your hernia gets larger.  Your hernia becomes painful. Get help right away if:  You develop sudden, severe pain near the area of your hernia.  You have pain as well as nausea or vomiting.  You have pain and the skin over your hernia changes color.  You develop a fever. This information is not intended to replace advice given to you by your health care  provider. Make sure you discuss any questions you have with your health care provider. Document Revised: 10/12/2017 Document Reviewed: 02/28/2017 Elsevier Patient Education  [place].

## 2019-09-26 NOTE — H&P (View-Only) (Signed)
09/26/2019  History of Present Illness: Jill [name] is a 34 y.o. female presenting for follow up of an umbilical hernia.  The patient was initially seen on 12/22 for initial visit.  At that point, she mentioned how she has seizures many times per week.  She was immediately referred to Neurology and Dr. Sherryll [name] saw her on 12/23.  She is currently on Keppra and has an order for an inpatient EEG study to be done at [facility].    From the hernia standpoint, she reports that she still have abdominal pain at the hernia site.  Her symptoms have not changed or improved.  There have not been any issues with strangulation or incarceration of the hernia.  Past Medical History: Past Medical History:  Diagnosis Date  . Anemia   . Headache    migrains  . Seizures (HCC)      Past Surgical History: Past Surgical History:  Procedure Laterality Date  . CESAREAN SECTION     x4  . CESAREAN SECTION N/A 10/07/2016   Procedure: CESAREAN SECTION;  Surgeon: Jill Bouchard, MD;  Location: [facility] ORS;  Service: Obstetrics;  Laterality: N/A;  . DIAGNOSTIC LAPAROSCOPY WITH REMOVAL OF ECTOPIC PREGNANCY Left 10/07/2018   Procedure: laparoscopic left salpingectomy;  Surgeon: Jill Laser, MD;  Location: [facility] ORS;  Service: Gynecology;  Laterality: Left;  . HERNIA REPAIR     umbilical x 2    Home Medications: Prior to Admission medications   Medication Sig Start Date End Date Taking? Authorizing Provider  butalbital-acetaminophen-caffeine (FIORICET) 50-325-40 MG tablet Take 1 tablet by mouth as needed for headache or migraine.  04/04/19  Yes [provider]  nortriptyline (PAMELOR) 10 MG capsule Take by mouth. 09/05/19 09/04/20 Yes [provider]  rizatriptan (MAXALT) 5 MG tablet Take 1 tablet (5 mg total) by mouth as needed for migraine. May repeat in 2 hours if needed 10/17/18  Yes Jill Laser, MD  SUMAtriptan (IMITREX) 100 MG tablet TK 1 T PO ONCE PRF MIGRAINE FOR UP TO 1 DOSE. MAY  TK 1 T AFTER 2 H IF NEEDED. 12/06/18  Yes [provider]  levETIRAcetam (KEPPRA) 500 MG tablet Take 2 tablets (1,000 mg total) by mouth 2 (two) times daily. 10/10/18 04/13/19  Jill Siren, MD    Allergies: No Known Allergies  Review of Systems: Review of Systems  Constitutional: Negative for chills and fever.  Respiratory: Negative for shortness of breath.   Cardiovascular: Negative for chest pain.  Gastrointestinal: Positive for abdominal pain. Negative for constipation, nausea and vomiting.  Neurological: Positive for seizures.    Physical Exam BP 123/81   Pulse (!) 108   Temp 97.9 F (36.6 C)   Resp 12   Ht 5\' 3"  (1.6 m)   Wt 215 lb 12.8 oz (97.9 kg)   LMP 08/30/2019 (Exact Date)   BMI 38.23 kg/m  CONSTITUTIONAL: No acute distress HEENT:  Normocephalic, atraumatic, extraocular motion intact. RESPIRATORY:  Lungs are clear, and breath sounds are equal bilaterally. Normal respiratory effort without pathologic use of accessory muscles. CARDIOVASCULAR: Heart is regular without murmurs, gallops, or rubs. GI: The abdomen is soft, obese, non-distended, with some tenderness to palpation, particularly at the hernia site in the umbilicus.  The hernia is reducible, but with tenderness.  Overall feels like a 1-2 cm defect, but unable to measure accurately due to tenderness.  No overlying skin changes or ulceration. NEUROLOGIC:  Motor and sensation is grossly normal.  Cranial nerves are grossly intact. PSYCH:  Alert and oriented to person, place and time. Affect is normal.  Labs/Imaging: None recently  Assessment and Plan: [name] with umbilical hernia.  --The patient has a recurrent umbilical hernia.  Discussed with the patient that given the symptoms, this warrants surgical repair.  I have discussed her case with Jill [name] with Neurology and he agrees that it is appropriate to proceed with surgery for the patient at this point.  As a precaution, we will  send a formal clearance form for documentation. --Discussed with her the role of an open umbilical hernia repair, likely to be done with mesh given the recurrence.  Discussed with her the procedure at length, including post-operative restriction and risks of bleeding, infection, and injury to surrounding structures.  She's willing to proceed. --Will schedule for 10/02/19.  She understands that she will need to get tested for COVID.  Face-to-face time spent with the patient and care providers was 25 minutes, with more than 50% of the time spent counseling, educating, and coordinating care of the patient.     Jill Wester Luis Oprah Camarena, MD [facility]    

## 2019-09-26 NOTE — Progress Notes (Signed)
09/26/2019  History of Present Illness: Jill [name] is a 34 y.o. female presenting for follow up of an umbilical hernia.  The patient was initially seen on 12/22 for initial visit.  At that point, she mentioned how she has seizures many times per week.  She was immediately referred to Neurology and Dr. Sherryll Burger saw her on 12/23.  She is currently on Keppra and has an order for an inpatient EEG study to be done at [facility].    From the hernia standpoint, she reports that she still have abdominal pain at the hernia site.  Her symptoms have not changed or improved.  There have not been any issues with strangulation or incarceration of the hernia.  Past Medical History: Past Medical History:  Diagnosis Date  . Anemia   . Headache    migrains  . Seizures (HCC)      Past Surgical History: Past Surgical History:  Procedure Laterality Date  . CESAREAN SECTION     x4  . CESAREAN SECTION N/A 10/07/2016   Procedure: CESAREAN SECTION;  Surgeon: Suzy Bouchard, MD;  Location: [facility] ORS;  Service: Obstetrics;  Laterality: N/A;  . DIAGNOSTIC LAPAROSCOPY WITH REMOVAL OF ECTOPIC PREGNANCY Left 10/07/2018   Procedure: laparoscopic left salpingectomy;  Surgeon: Hildred Laser, MD;  Location: [facility] ORS;  Service: Gynecology;  Laterality: Left;  . HERNIA REPAIR     umbilical x 2    Home Medications: Prior to Admission medications   Medication Sig Start Date End Date Taking? Authorizing Provider  butalbital-acetaminophen-caffeine (FIORICET) 50-325-40 MG tablet Take 1 tablet by mouth as needed for headache or migraine.  04/04/19  Yes [provider]  nortriptyline (PAMELOR) 10 MG capsule Take by mouth. 09/05/19 09/04/20 Yes [provider]  rizatriptan (MAXALT) 5 MG tablet Take 1 tablet (5 mg total) by mouth as needed for migraine. May repeat in 2 hours if needed 10/17/18  Yes Hildred Laser, MD  SUMAtriptan (IMITREX) 100 MG tablet TK 1 T PO ONCE PRF MIGRAINE FOR UP TO 1 DOSE. MAY  TK 1 T AFTER 2 H IF NEEDED. 12/06/18  Yes [provider]  levETIRAcetam (KEPPRA) 500 MG tablet Take 2 tablets (1,000 mg total) by mouth 2 (two) times daily. 10/10/18 04/13/19  Houston Siren, MD    Allergies: No Known Allergies  Review of Systems: Review of Systems  Constitutional: Negative for chills and fever.  Respiratory: Negative for shortness of breath.   Cardiovascular: Negative for chest pain.  Gastrointestinal: Positive for abdominal pain. Negative for constipation, nausea and vomiting.  Neurological: Positive for seizures.    Physical Exam BP 123/81   Pulse (!) 108   Temp 97.9 F (36.6 C)   Resp 12   Ht 5\' 3"  (1.6 m)   Wt 215 lb 12.8 oz (97.9 kg)   LMP 08/30/2019 (Exact Date)   BMI 38.23 kg/m  CONSTITUTIONAL: No acute distress HEENT:  Normocephalic, atraumatic, extraocular motion intact. RESPIRATORY:  Lungs are clear, and breath sounds are equal bilaterally. Normal respiratory effort without pathologic use of accessory muscles. CARDIOVASCULAR: Heart is regular without murmurs, gallops, or rubs. GI: The abdomen is soft, obese, non-distended, with some tenderness to palpation, particularly at the hernia site in the umbilicus.  The hernia is reducible, but with tenderness.  Overall feels like a 1-2 cm defect, but unable to measure accurately due to tenderness.  No overlying skin changes or ulceration. NEUROLOGIC:  Motor and sensation is grossly normal.  Cranial nerves are grossly intact. PSYCH:  Alert and oriented to person, place and time. Affect is normal.  Labs/Imaging: None recently  Assessment and Plan: This is a 34 y.o. female with umbilical hernia.  --The patient has a recurrent umbilical hernia.  Discussed with the patient that given the symptoms, this warrants surgical repair.  I have discussed her case with Dr. Manuella Ghazi with Neurology and he agrees that it is appropriate to proceed with surgery for the patient at this point.  As a precaution, we will  send a formal clearance form for documentation. --Discussed with her the role of an open umbilical hernia repair, likely to be done with mesh given the recurrence.  Discussed with her the procedure at length, including post-operative restriction and risks of bleeding, infection, and injury to surrounding structures.  She's willing to proceed. --Will schedule for 10/02/19.  She understands that she will need to get tested for COVID.  Face-to-face time spent with the patient and care providers was 25 minutes, with more than 50% of the time spent counseling, educating, and coordinating care of the patient.     Melvyn Neth, [facility]

## 2019-09-27 ENCOUNTER — Telehealth: Payer: Self-pay | Admitting: Emergency Medicine

## 2019-09-27 ENCOUNTER — Telehealth: Payer: Self-pay | Admitting: Surgery

## 2019-09-27 NOTE — Telephone Encounter (Signed)
Pt has been advised of pre admission date/time, Covid Testing date and Surgery date.  Surgery Date: 10/02/19 Preadmission Testing Date: 10/01/19 Covid Testing Date: 09/28/19 - patient advised to [facility] to [facility] ([address] [facility])  Patient has been made aware to call [phone], between 1-3:00pm the day before surgery, to find out what time to arrive.

## 2019-09-27 NOTE — Telephone Encounter (Signed)
Called Dr. Margaretmary Eddy office in reference to Clearance sent over yesterday. Nurse is not available to check referrals at this time, states she will call back.

## 2019-09-28 ENCOUNTER — Other Ambulatory Visit
Admission: RE | Admit: 2019-09-28 | Discharge: 2019-09-28 | Disposition: A | Discharge status: Home / Self Care | Payer: Medicaid Other | Source: Ambulatory Visit | Attending: Surgery | Admitting: Surgery

## 2019-09-28 ENCOUNTER — Other Ambulatory Visit: Payer: Self-pay

## 2019-09-28 DIAGNOSIS — Z01812 Encounter for preprocedural laboratory examination: Secondary | ICD-10-CM | POA: Insufficient documentation

## 2019-09-28 DIAGNOSIS — Z20822 Contact with and (suspected) exposure to covid-19: Secondary | ICD-10-CM | POA: Diagnosis not present

## 2019-09-28 NOTE — Telephone Encounter (Signed)
Called Dr. Margaretmary Eddy office today and spoke to [place] in regards to clearance sent over. She states they did received clearance form and is only waiting for doctor's signature.

## 2019-09-29 LAB — SARS CORONAVIRUS 2 (TAT 6-24 HRS): SARS Coronavirus 2: NEGATIVE

## 2019-10-01 ENCOUNTER — Encounter
Admission: RE | Admit: 2019-10-01 | Discharge: 2019-10-01 | Disposition: A | Discharge status: Home / Self Care | Payer: Medicaid Other | Source: Ambulatory Visit | Attending: Surgery | Admitting: Surgery

## 2019-10-01 ENCOUNTER — Other Ambulatory Visit: Payer: Self-pay

## 2019-10-01 MED ORDER — CEFAZOLIN SODIUM-DEXTROSE 2-4 GM/100ML-% IV SOLN
2.0000 g | INTRAVENOUS | Status: AC
  Administered 2019-10-02: 11:00:00 2 g via INTRAVENOUS

## 2019-10-01 NOTE — Progress Notes (Signed)
Neurology clearance received from Dr Margaretmary Eddy office. Patient is at low risk for surgery.

## 2019-10-01 NOTE — Patient Instructions (Addendum)
Your procedure is scheduled on: Tuesday 10/01/18.  Report to DAY SURGERY DEPARTMENT LOCATED ON 2ND FLOOR MEDICAL MALL ENTRANCE. To find out your arrival time please call [phone] between 1PM - 3PM on Monday 09/30/18.  Remember: Instructions that are not followed completely may result in serious medical risk, up to and including death, or upon the discretion of your surgeon and anesthesiologist your surgery may need to be rescheduled.      _X__ 1. Do not eat food after midnight the night before your procedure.                 No gum chewing or hard candies. You may drink clear liquids up to 2 hours                 before you are scheduled to arrive for your surgery- DO NOT drink clear                 liquids within 2 hours of the start of your surgery.                 Clear Liquids include:  water, apple juice without pulp, clear carbohydrate                 drink such as Clearfast or Gatorade, Black Coffee or Tea (Do not add                 anything to coffee or tea).   __X__2.  On the morning of surgery brush your teeth with toothpaste and water, you may rinse your mouth with mouthwash if you wish.  Do not swallow any toothpaste or mouthwash.      _X__ 3.  No Alcohol for 24 hours before or after surgery.    _X__ 4.  Do Not Smoke or use e-cigarettes For 24 Hours Prior to Your Surgery.                 Do not use any chewable tobacco products for at least 6 hours prior to                 surgery.   __X__5.  Notify your doctor if there is any change in your medical condition      (cold, fever, infections).      Do not wear jewelry, make-up, hairpins, clips or nail polish. Do not wear lotions, powders, or perfumes.  Do not shave 48 hours prior to surgery. Men may shave face and neck. Do not bring valuables to the hospital.     [facility] is not responsible for any belongings or valuables.   Contacts, dentures/partials or body piercings may not be worn into surgery. Bring a  case for your contacts, glasses or hearing aids, a denture cup will be supplied. Leave your suitcase in the car. After surgery it may be brought to your room. For patients admitted to the hospital, discharge time is determined by your treatment team.    Patients discharged the day of surgery will not be allowed to drive home.    __X__ Take these medicines the morning of surgery with A SIP OF WATER:     1. levETIRAcetam (KEPPRA) 500 MG tablet  2. rizatriptan (MAXALT) 5 MG tablet if needed    __X__ Stop Anti-inflammatories 7 days before surgery such as Advil, Ibuprofen, Motrin, BC or Goodies Powder, Naprosyn, Naproxen, Aleve, Aspirin, Meloxicam. May take Tylenol if needed for pain or discomfort.

## 2019-10-02 ENCOUNTER — Ambulatory Visit: Payer: Medicaid Other | Admitting: Anesthesiology

## 2019-10-02 ENCOUNTER — Encounter
Admission: RE | Disposition: A | Discharge status: Home / Self Care | Payer: Self-pay | Source: Home / Self Care | Attending: Surgery

## 2019-10-02 ENCOUNTER — Encounter: Payer: Self-pay | Admitting: Surgery

## 2019-10-02 ENCOUNTER — Ambulatory Visit
Admission: RE | Admit: 2019-10-02 | Discharge: 2019-10-02 | Disposition: A | Discharge status: Home / Self Care | Payer: Medicaid Other | Attending: Surgery | Admitting: Surgery

## 2019-10-02 DIAGNOSIS — R569 Unspecified convulsions: Secondary | ICD-10-CM | POA: Diagnosis not present

## 2019-10-02 DIAGNOSIS — G43909 Migraine, unspecified, not intractable, without status migrainosus: Secondary | ICD-10-CM | POA: Diagnosis not present

## 2019-10-02 DIAGNOSIS — Z9079 Acquired absence of other genital organ(s): Secondary | ICD-10-CM | POA: Insufficient documentation

## 2019-10-02 DIAGNOSIS — Z79899 Other long term (current) drug therapy: Secondary | ICD-10-CM | POA: Insufficient documentation

## 2019-10-02 DIAGNOSIS — K429 Umbilical hernia without obstruction or gangrene: Secondary | ICD-10-CM

## 2019-10-02 DIAGNOSIS — Z87891 Personal history of nicotine dependence: Secondary | ICD-10-CM | POA: Insufficient documentation

## 2019-10-02 HISTORY — PX: UMBILICAL HERNIA REPAIR: SHX196

## 2019-10-02 LAB — POCT PREGNANCY, URINE: Preg Test, Ur: NEGATIVE

## 2019-10-02 SURGERY — REPAIR, HERNIA, UMBILICAL, ADULT
Anesthesia: General

## 2019-10-02 MED ORDER — FAMOTIDINE 20 MG PO TABS: 20.0000 mg | ORAL_TABLET | Freq: Once | ORAL | Status: AC

## 2019-10-02 MED ORDER — ONDANSETRON HCL 4 MG/2ML IJ SOLN
INTRAMUSCULAR | Status: DC | PRN
  Administered 2019-10-02: 4 mg via INTRAVENOUS

## 2019-10-02 MED ORDER — LIDOCAINE HCL (PF) 2 % IJ SOLN
INTRAMUSCULAR | Status: AC
  Filled 2019-10-02: qty 10

## 2019-10-02 MED ORDER — GABAPENTIN 300 MG PO CAPS: 300.0000 mg | ORAL_CAPSULE | ORAL | Status: AC

## 2019-10-02 MED ORDER — BUPIVACAINE LIPOSOME 1.3 % IJ SUSP
INTRAMUSCULAR | Status: AC
  Filled 2019-10-02: qty 20

## 2019-10-02 MED ORDER — BUPIVACAINE LIPOSOME 1.3 % IJ SUSP
INTRAMUSCULAR | Status: DC | PRN
  Administered 2019-10-02: 20 mL

## 2019-10-02 MED ORDER — MIDAZOLAM HCL 2 MG/2ML IJ SOLN
INTRAMUSCULAR | Status: AC
  Filled 2019-10-02: qty 2

## 2019-10-02 MED ORDER — SUGAMMADEX SODIUM 200 MG/2ML IV SOLN
INTRAVENOUS | Status: AC
  Filled 2019-10-02: qty 2

## 2019-10-02 MED ORDER — FENTANYL CITRATE (PF) 100 MCG/2ML IJ SOLN
INTRAMUSCULAR | Status: AC
  Filled 2019-10-02: qty 2

## 2019-10-02 MED ORDER — DEXAMETHASONE SODIUM PHOSPHATE 10 MG/ML IJ SOLN
INTRAMUSCULAR | Status: DC | PRN
  Administered 2019-10-02: 10 mg via INTRAVENOUS

## 2019-10-02 MED ORDER — PROPOFOL 10 MG/ML IV BOLUS
INTRAVENOUS | Status: DC | PRN
  Administered 2019-10-02: 150 mg via INTRAVENOUS

## 2019-10-02 MED ORDER — FENTANYL CITRATE (PF) 100 MCG/2ML IJ SOLN
INTRAMUSCULAR | Status: AC
  Administered 2019-10-02: 25 ug via INTRAVENOUS
  Filled 2019-10-02: qty 2

## 2019-10-02 MED ORDER — DEXAMETHASONE SODIUM PHOSPHATE 10 MG/ML IJ SOLN
INTRAMUSCULAR | Status: AC
  Filled 2019-10-02: qty 1

## 2019-10-02 MED ORDER — FENTANYL CITRATE (PF) 100 MCG/2ML IJ SOLN
INTRAMUSCULAR | Status: DC | PRN
  Administered 2019-10-02 (×2): 50 ug via INTRAVENOUS

## 2019-10-02 MED ORDER — BUPIVACAINE HCL (PF) 0.5 % IJ SOLN
INTRAMUSCULAR | Status: AC
  Filled 2019-10-02: qty 30

## 2019-10-02 MED ORDER — SUGAMMADEX SODIUM 200 MG/2ML IV SOLN
INTRAVENOUS | Status: DC | PRN
  Administered 2019-10-02: 200 mg via INTRAVENOUS

## 2019-10-02 MED ORDER — PROPOFOL 10 MG/ML IV BOLUS
INTRAVENOUS | Status: AC
  Filled 2019-10-02: qty 20

## 2019-10-02 MED ORDER — OXYCODONE HCL 5 MG PO TABS: 5.0000 mg | ORAL_TABLET | ORAL | 0 refills | Status: DC | PRN

## 2019-10-02 MED ORDER — LACTATED RINGERS IV SOLN: INTRAVENOUS | Status: DC

## 2019-10-02 MED ORDER — MIDAZOLAM HCL 2 MG/2ML IJ SOLN
INTRAMUSCULAR | Status: DC | PRN
  Administered 2019-10-02: 2 mg via INTRAVENOUS

## 2019-10-02 MED ORDER — ACETAMINOPHEN 500 MG PO TABS: 1000.0000 mg | ORAL_TABLET | ORAL | Status: AC

## 2019-10-02 MED ORDER — OXYCODONE HCL 5 MG PO TABS
5.0000 mg | ORAL_TABLET | Freq: Once | ORAL | Status: DC
  Filled 2019-10-02: qty 1

## 2019-10-02 MED ORDER — IBUPROFEN 600 MG PO TABS: 600.0000 mg | ORAL_TABLET | Freq: Three times a day (TID) | ORAL | 1 refills | Status: DC | PRN

## 2019-10-02 MED ORDER — ACETAMINOPHEN 500 MG PO TABS
ORAL_TABLET | ORAL | Status: AC
  Administered 2019-10-02: 1000 mg via ORAL
  Filled 2019-10-02: qty 2

## 2019-10-02 MED ORDER — FENTANYL CITRATE (PF) 100 MCG/2ML IJ SOLN: 25.0000 ug | INTRAMUSCULAR | Status: DC | PRN

## 2019-10-02 MED ORDER — ONDANSETRON HCL 4 MG/2ML IJ SOLN
INTRAMUSCULAR | Status: AC
  Filled 2019-10-02: qty 2

## 2019-10-02 MED ORDER — ROCURONIUM BROMIDE 50 MG/5ML IV SOLN
INTRAVENOUS | Status: AC
  Filled 2019-10-02: qty 1

## 2019-10-02 MED ORDER — BUPIVACAINE LIPOSOME 1.3 % IJ SUSP: 20.0000 mL | Freq: Once | INTRAMUSCULAR | Status: DC

## 2019-10-02 MED ORDER — FAMOTIDINE 20 MG PO TABS
ORAL_TABLET | ORAL | Status: AC
  Administered 2019-10-02: 20 mg via ORAL
  Filled 2019-10-02: qty 1

## 2019-10-02 MED ORDER — ROCURONIUM BROMIDE 100 MG/10ML IV SOLN
INTRAVENOUS | Status: DC | PRN
  Administered 2019-10-02: 10 mg via INTRAVENOUS
  Administered 2019-10-02: 40 mg via INTRAVENOUS

## 2019-10-02 MED ORDER — BUPIVACAINE-EPINEPHRINE (PF) 0.5% -1:200000 IJ SOLN
INTRAMUSCULAR | Status: DC | PRN
  Administered 2019-10-02: 30 mL

## 2019-10-02 MED ORDER — LIDOCAINE HCL (CARDIAC) PF 100 MG/5ML IV SOSY
PREFILLED_SYRINGE | INTRAVENOUS | Status: DC | PRN
  Administered 2019-10-02: 100 mg via INTRAVENOUS

## 2019-10-02 MED ORDER — GABAPENTIN 300 MG PO CAPS
ORAL_CAPSULE | ORAL | Status: AC
  Administered 2019-10-02: 300 mg via ORAL
  Filled 2019-10-02: qty 1

## 2019-10-02 MED ORDER — METOCLOPRAMIDE HCL 5 MG/ML IJ SOLN
INTRAMUSCULAR | Status: AC
  Filled 2019-10-02: qty 2

## 2019-10-02 MED ORDER — PHENYLEPHRINE HCL (PRESSORS) 10 MG/ML IV SOLN
INTRAVENOUS | Status: DC | PRN
  Administered 2019-10-02 (×2): 100 ug via INTRAVENOUS

## 2019-10-02 MED ORDER — METOCLOPRAMIDE HCL 5 MG/ML IJ SOLN
10.0000 mg | Freq: Once | INTRAMUSCULAR | Status: AC
  Administered 2019-10-02: 14:00:00 10 mg via INTRAVENOUS

## 2019-10-02 MED ORDER — CEFAZOLIN SODIUM-DEXTROSE 2-4 GM/100ML-% IV SOLN
INTRAVENOUS | Status: AC
  Filled 2019-10-02: qty 100

## 2019-10-02 MED ORDER — EPINEPHRINE PF 1 MG/ML IJ SOLN
INTRAMUSCULAR | Status: AC
  Filled 2019-10-02: qty 1

## 2019-10-02 MED ORDER — CHLORHEXIDINE GLUCONATE CLOTH 2 % EX PADS: 6.0000 | MEDICATED_PAD | Freq: Once | CUTANEOUS | Status: DC

## 2019-10-02 MED ORDER — ONDANSETRON HCL 4 MG/2ML IJ SOLN
4.0000 mg | Freq: Once | INTRAMUSCULAR | Status: AC | PRN
  Administered 2019-10-02: 4 mg via INTRAVENOUS

## 2019-10-02 SURGICAL SUPPLY — 36 items
BLADE SURG 15 STRL LF DISP TIS (BLADE) ×1 IMPLANT
BLADE SURG 15 STRL SS (BLADE) ×2
CANISTER SUCT 1200ML W/VALVE (MISCELLANEOUS) ×3 IMPLANT
CHLORAPREP W/TINT 26 (MISCELLANEOUS) ×3 IMPLANT
COVER WAND RF STERILE (DRAPES) ×3 IMPLANT
DERMABOND ADVANCED (GAUZE/BANDAGES/DRESSINGS) ×2
DERMABOND ADVANCED .7 DNX12 (GAUZE/BANDAGES/DRESSINGS) ×1 IMPLANT
DRAPE LAPAROTOMY 77X122 PED (DRAPES) ×3 IMPLANT
ELECT REM PT RETURN 9FT ADLT (ELECTROSURGICAL) ×3
ELECTRODE REM PT RTRN 9FT ADLT (ELECTROSURGICAL) ×1 IMPLANT
GLOVE BIO SURGEON STRL SZ 6.5 (GLOVE) ×1 IMPLANT
GLOVE BIO SURGEON STRL SZ7 (GLOVE) ×2 IMPLANT
GLOVE BIO SURGEON STRL SZ7.5 (GLOVE) ×2 IMPLANT
GLOVE BIO SURGEONS STRL SZ 6.5 (GLOVE) ×1
GLOVE INDICATOR 6.5 STRL GRN (GLOVE) ×2 IMPLANT
GLOVE INDICATOR 7.0 STRL GRN (GLOVE) ×2 IMPLANT
GLOVE INDICATOR 7.5 STRL GRN (GLOVE) ×2 IMPLANT
GLOVE SURG SYN 7.0 (GLOVE) ×3 IMPLANT
GLOVE SURG SYN 7.0 PF PI (GLOVE) ×1 IMPLANT
GLOVE SURG SYN 7.5  E (GLOVE) ×2
GLOVE SURG SYN 7.5 E (GLOVE) ×1 IMPLANT
GLOVE SURG SYN 7.5 PF PI (GLOVE) ×1 IMPLANT
GOWN STRL REUS W/ TWL LRG LVL3 (GOWN DISPOSABLE) ×3 IMPLANT
GOWN STRL REUS W/TWL LRG LVL3 (GOWN DISPOSABLE) ×8
MESH VENTRALEX ST 2.5 CRC MED (Mesh General) ×2 IMPLANT
NEEDLE HYPO 22GX1.5 SAFETY (NEEDLE) ×3 IMPLANT
NS IRRIG 500ML POUR BTL (IV SOLUTION) ×3 IMPLANT
PACK BASIN MINOR ARMC (MISCELLANEOUS) ×3 IMPLANT
SUT ETHIBOND 0 MO6 C/R (SUTURE) ×3 IMPLANT
SUT MNCRL AB 4-0 PS2 18 (SUTURE) ×5 IMPLANT
SUT VIC AB 2-0 SH 27 (SUTURE) ×2
SUT VIC AB 2-0 SH 27XBRD (SUTURE) ×1 IMPLANT
SUT VIC AB 3-0 SH 27 (SUTURE) ×2
SUT VIC AB 3-0 SH 27X BRD (SUTURE) ×1 IMPLANT
SYR 20ML LL LF (SYRINGE) ×3 IMPLANT
SYR BULB IRRIG 60ML STRL (SYRINGE) ×3 IMPLANT

## 2019-10-02 NOTE — Op Note (Addendum)
Procedure Date:  10/02/2019  Pre-operative Diagnosis:  Reducible Incisional Umbilical hernia  Post-operative Diagnosis:  Reducible Incisional Umbilical hernia  Procedure:  Incisional Umbilical hernia repair with mesh  Surgeon:  Howie Ill, MD  Assistant:  Jerald Kief, PA-S  Anesthesia:  General endotracheal  Estimated Blood Loss:  5 ml  Specimens:  Hernia sac  Complications:  None  Indications for Procedure:  This is a 34 y.o. female who presents with a reducible incisional umbilical hernia, which causes her pain.  She had had prior laparoscopic surgery and prior hernia repair in the past.  The risks of bleeding, abscess or infection, injury to surrounding structures, and need for further procedures were all discussed with the patient and was willing to proceed.  Description of Procedure: The patient was correctly identified in the preoperative area and brought into the operating room.  The patient was placed supine with VTE prophylaxis in place.  Appropriate time-outs were performed.  Anesthesia was induced and the patient was intubated.  Appropriate antibiotics were infused.  The abdomen was prepped and draped in a sterile fashion.  An infraumbilical incision was made and cautery was used to dissect down the subcutaneous tissue along the umbilical stalk.  Kelly forceps were used to dissect the umbilical stalk and it was divided at the fascia using cautery.  This allowed visualization of the hernia defect.  The hernia was reduced without complications.  The hernia sac was excised and sent to pathology.  The fascial edges were cleaned using cautery.  A Bard 6.4 cm circular mesh was introduced via the defect and spread open.  The tails were secured to the fascia using 2-0 Prolene sutures.  The fascia was then closed with 0 Ethibond sutures, incorporating a layer of the mesh with each suture.  The umbilical stalk was then reattached to the fascia using 2-0 Vicryl. The wound was  irrigated and local anesthetic was infused.  The wound was then closed in layers using 3-0 Vicryl and 4-0 Monocryl. The incision was cleaned and sealed with DermaBond.  The patient was emerged from anesthesia and extubated and brought to the recovery room for further management.  The patient tolerated the procedure well and all counts were correct at the end of the case.   Howie Ill, MD

## 2019-10-02 NOTE — Anesthesia Postprocedure Evaluation (Signed)
Anesthesia Post Note  Patient: Jill [name]  Procedure(s) Performed: HERNIA REPAIR UMBILICAL ADULT (N/A )  Patient location during evaluation: PACU Anesthesia Type: General Level of consciousness: awake and alert Pain management: pain level controlled Vital Signs Assessment: post-procedure vital signs reviewed and stable Respiratory status: spontaneous breathing and respiratory function stable Cardiovascular status: stable Anesthetic complications: no     Last Vitals:  Vitals:   10/02/19 1240 10/02/19 1245  BP:  124/83  Pulse: 89 93  Resp: 13 (!) 9  Temp:    SpO2: 100% 95%    Last Pain:  Vitals:   10/02/19 1250  TempSrc:   PainSc: (P) 5                  Welden Hausmann K

## 2019-10-02 NOTE — Anesthesia Preprocedure Evaluation (Signed)
Anesthesia Evaluation  Patient identified by MRN, date of birth, ID band Patient awake    Reviewed: Allergy & Precautions, NPO status , Patient's Chart, lab work & pertinent test results  History of Anesthesia Complications Negative for: history of anesthetic complications  Airway Mallampati: II       Dental   Pulmonary neg sleep apnea, neg COPD, Not current smoker, former smoker,           Cardiovascular (-) hypertension(-) Past MI and (-) CHF (-) dysrhythmias (-) Valvular Problems/Murmurs     Neuro/Psych Seizures - (none x 3 weeks),     GI/Hepatic Neg liver ROS, GERD  ,  Endo/Other  neg diabetes  Renal/GU Renal InsufficiencyRenal disease     Musculoskeletal   Abdominal   Peds  Hematology  (+) anemia ,   Anesthesia Other Findings   Reproductive/Obstetrics                             Anesthesia Physical Anesthesia Plan  ASA: III  Anesthesia Plan: General   Post-op Pain Management:    Induction: Intravenous  PONV Risk Score and Plan: 3 and Ondansetron, Dexamethasone and Midazolam  Airway Management Planned: Oral ETT  Additional Equipment:   Intra-op Plan:   Post-operative Plan:   Informed Consent: I have reviewed the patients History and Physical, chart, labs and discussed the procedure including the risks, benefits and alternatives for the proposed anesthesia with the patient or authorized representative who has indicated his/her understanding and acceptance.       Plan Discussed with:   Anesthesia Plan Comments:         Anesthesia Quick Evaluation

## 2019-10-02 NOTE — Transfer of Care (Signed)
Immediate Anesthesia Transfer of Care Note  Patient: Jill [name]  Procedure(s) Performed: HERNIA REPAIR UMBILICAL ADULT (N/A )  Patient Location: PACU  Anesthesia Type:General  Level of Consciousness: awake  Airway & Oxygen Therapy: Patient connected to face mask oxygen  Post-op Assessment: Post -op Vital signs reviewed and stable  Post vital signs: stable  Last Vitals:  Vitals Value Taken Time  BP 130/86 10/02/19 1216  Temp 37.1 C 10/02/19 1215  Pulse 102 10/02/19 1216  Resp 14 10/02/19 1216  SpO2 100 % 10/02/19 1216  Vitals shown include unvalidated device data.  Last Pain:  Vitals:   10/02/19 0951  TempSrc: Tympanic  PainSc: 7          Complications: No apparent anesthesia complications

## 2019-10-02 NOTE — Anesthesia Procedure Notes (Signed)
Procedure Name: Intubation Date/Time: 10/02/2019 10:52 AM Performed by: Aline Brochure, CRNA Pre-anesthesia Checklist: Patient identified, Emergency Drugs available, Suction available and Patient being monitored Patient Re-evaluated:Patient Re-evaluated prior to induction Oxygen Delivery Method: Circle system utilized Preoxygenation: Pre-oxygenation with 100% oxygen Induction Type: IV induction Ventilation: Mask ventilation without difficulty Laryngoscope Size: Mac and 3 Grade View: Grade II Tube type: Oral Tube size: 7.0 mm Number of attempts: 1 Airway Equipment and Method: Stylet Placement Confirmation: ETT inserted through vocal cords under direct vision,  positive ETCO2 and breath sounds checked- equal and bilateral Secured at: 21 cm Tube secured with: Tape Dental Injury: Teeth and Oropharynx as per pre-operative assessment

## 2019-10-02 NOTE — Interval H&P Note (Signed)
History and Physical Interval Note:  10/02/2019 10:23 AM  Jill [name]  has presented today for surgery, with the diagnosis of Umbilical Hernia.  The various methods of treatment have been discussed with the patient and family. After consideration of risks, benefits and other options for treatment, the patient has consented to  Procedure(s): HERNIA REPAIR UMBILICAL ADULT (N/A) as a surgical intervention.  The patient's history has been reviewed, patient examined, no change in status, stable for surgery.  I have reviewed the patient's chart and labs.  Questions were answered to the patient's satisfaction.     Lexa Coronado

## 2019-10-02 NOTE — Discharge Instructions (Signed)

## 2019-10-03 ENCOUNTER — Telehealth: Payer: Self-pay | Admitting: *Deleted

## 2019-10-03 ENCOUNTER — Other Ambulatory Visit: Payer: Self-pay | Admitting: Surgery

## 2019-10-03 LAB — SURGICAL PATHOLOGY

## 2019-10-03 MED ORDER — HYDROCODONE-ACETAMINOPHEN 5-325 MG PO TABS: 1.0000 | ORAL_TABLET | ORAL | 0 refills | Status: DC | PRN

## 2019-10-03 MED ORDER — ONDANSETRON 4 MG PO TBDP: 4.0000 mg | ORAL_TABLET | Freq: Three times a day (TID) | ORAL | 0 refills | Status: DC | PRN

## 2019-10-03 NOTE — Telephone Encounter (Signed)
Patient called and stated she had surgery yesterday with Dr.Piscoya umbilical hernia repair and she is taking oxycodone but its not helping, she stated that she is in a lot of pain and wants to see if she can get something different. Please call and advsie

## 2019-10-03 NOTE — Progress Notes (Signed)
10/03/19  Called patient back to discuss her pain symptoms.  She reports that she has tried Tylenol, Ibuprofen, and Oxycodone, and these have not helped.  She also has been having nausea and episodes of emesis since last night.  Reviewing her chart, she reports that she thinks Vicodin had helped after her prior surgery.  Will send a prescription for Vicodin to her pharmacy now, as well as Zofran prn nausea.  Instructed the patient to call back if these do not help.   Henrene Dodge, MD

## 2019-10-04 NOTE — Telephone Encounter (Signed)
Spoke with Dr.Piscoya and was notified that he spoke with patient yesterday and prescribed patient the pain medication Vicodin. Patient was notified.

## 2019-10-12 ENCOUNTER — Ambulatory Visit: Payer: Medicaid Other | Attending: Neurology

## 2019-10-12 DIAGNOSIS — R0683 Snoring: Secondary | ICD-10-CM | POA: Insufficient documentation

## 2019-10-15 ENCOUNTER — Other Ambulatory Visit: Payer: Self-pay

## 2019-10-17 ENCOUNTER — Other Ambulatory Visit: Payer: Self-pay

## 2019-10-17 ENCOUNTER — Encounter: Payer: Medicaid Other | Admitting: Surgery

## 2019-10-17 ENCOUNTER — Encounter: Payer: Self-pay | Admitting: Surgery

## 2019-10-17 ENCOUNTER — Ambulatory Visit (INDEPENDENT_AMBULATORY_CARE_PROVIDER_SITE_OTHER): Payer: Self-pay | Admitting: Surgery

## 2019-10-17 VITALS — BP 101/70 | HR 96 | Temp 97.5°F | Resp 14 | Ht 63.0 in | Wt 214.0 lb

## 2019-10-17 DIAGNOSIS — Z09 Encounter for follow-up examination after completed treatment for conditions other than malignant neoplasm: Secondary | ICD-10-CM

## 2019-10-17 NOTE — Patient Instructions (Signed)
Follow-up with our office as needed.  Please call and ask to speak with a nurse if you develop questions or concerns.  GENERAL POST-OPERATIVE PATIENT INSTRUCTIONS   WOUND CARE INSTRUCTIONS:  Keep a dry clean dressing on the wound if there is drainage. The initial bandage may be removed after 24 hours.  Once the wound has quit draining you may leave it open to air.  If clothing rubs against the wound or causes irritation and the wound is not draining you may cover it with a dry dressing during the daytime.  Try to keep the wound dry and avoid ointments on the wound unless directed to do so.  If the wound becomes bright red and painful or starts to drain infected material that is not clear, please contact your physician immediately.  If the wound is mildly pink and has a thick firm ridge underneath it, this is normal, and is referred to as a healing ridge.  This will resolve over the next 4-6 weeks.  ACTIVITY:  You are encouraged to cough and deep breath or use your incentive spirometer if you were given one, every 15-30 minutes when awake.  This will help prevent respiratory complications and low grade fevers post-operatively if you had a general anesthetic.  You may want to hug a pillow when coughing and sneezing to add additional support to the surgical area, if you had abdominal or chest surgery, which will decrease pain during these times.  You are encouraged to walk and engage in light activity for the next two weeks.  You should not lift more than 20 pounds for 6 weeks after surgery as it could put you at increased risk for complications.  Twenty pounds is roughly equivalent to a plastic bag of groceries. At that time- Listen to your body when lifting, if you have pain when lifting, stop and then try again in a few days. Soreness after doing exercises or activities of daily living is normal as you get back in to your normal routine.  MEDICATIONS:  Try to take narcotic medications and  anti-inflammatory medications, such as tylenol, ibuprofen, naprosyn, etc., with food.  This will minimize stomach upset from the medication.  Should you develop nausea and vomiting from the pain medication, or develop a rash, please discontinue the medication and contact your physician.  You should not drive, make important decisions, or operate machinery when taking narcotic pain medication.  SUNBLOCK Use sun block to incision area over the next year if this area will be exposed to sun. This helps decrease scarring and will allow you avoid a permanent darkened area over your incision.  QUESTIONS:  Please feel free to call our office if you have any questions, and we will be glad to assist you.

## 2019-10-17 NOTE — Progress Notes (Signed)
S/p [facility] repair by Dr. Aleen Campi 1/19 Doing well Some soreness No fevers or chills  PE NAD Abd: soft, incision c/d/i, mild seroma, mild appropriate incisional tenderness , no peritonitis  A/P  Doing well  Expected small seroma post op No heavy lifting RTC prn

## 2019-11-16 ENCOUNTER — Other Ambulatory Visit (HOSPITAL_COMMUNITY)
Admission: RE | Admit: 2019-11-16 | Discharge: 2019-11-16 | Disposition: A | Discharge status: Home / Self Care | Payer: Medicaid Other | Source: Ambulatory Visit | Attending: Neurology | Admitting: Neurology

## 2019-11-16 DIAGNOSIS — Z01812 Encounter for preprocedural laboratory examination: Secondary | ICD-10-CM | POA: Diagnosis present

## 2019-11-16 DIAGNOSIS — Z20822 Contact with and (suspected) exposure to covid-19: Secondary | ICD-10-CM | POA: Insufficient documentation

## 2019-11-16 LAB — SARS CORONAVIRUS 2 (TAT 6-24 HRS): SARS Coronavirus 2: NEGATIVE

## 2019-11-19 ENCOUNTER — Inpatient Hospital Stay (HOSPITAL_COMMUNITY): Payer: Medicaid Other

## 2019-11-19 ENCOUNTER — Inpatient Hospital Stay (HOSPITAL_COMMUNITY)
Admission: RE | Admit: 2019-11-19 | Discharge: 2019-11-23 | DRG: 101 | Disposition: A | Discharge status: Home / Self Care | Payer: Medicaid Other | Source: Ambulatory Visit | Attending: Neurology | Admitting: Neurology

## 2019-11-19 DIAGNOSIS — G40109 Localization-related (focal) (partial) symptomatic epilepsy and epileptic syndromes with simple partial seizures, not intractable, without status epilepticus: Secondary | ICD-10-CM | POA: Diagnosis present

## 2019-11-19 DIAGNOSIS — R569 Unspecified convulsions: Secondary | ICD-10-CM | POA: Diagnosis present

## 2019-11-19 DIAGNOSIS — Z79899 Other long term (current) drug therapy: Secondary | ICD-10-CM

## 2019-11-19 DIAGNOSIS — G4700 Insomnia, unspecified: Secondary | ICD-10-CM | POA: Diagnosis not present

## 2019-11-19 DIAGNOSIS — R519 Headache, unspecified: Secondary | ICD-10-CM | POA: Diagnosis present

## 2019-11-19 DIAGNOSIS — Z87891 Personal history of nicotine dependence: Secondary | ICD-10-CM

## 2019-11-19 DIAGNOSIS — F419 Anxiety disorder, unspecified: Secondary | ICD-10-CM | POA: Diagnosis present

## 2019-11-19 DIAGNOSIS — Z23 Encounter for immunization: Secondary | ICD-10-CM | POA: Diagnosis not present

## 2019-11-19 DIAGNOSIS — G8929 Other chronic pain: Secondary | ICD-10-CM

## 2019-11-19 DIAGNOSIS — G40009 Localization-related (focal) (partial) idiopathic epilepsy and epileptic syndromes with seizures of localized onset, not intractable, without status epilepticus: Secondary | ICD-10-CM | POA: Diagnosis not present

## 2019-11-19 LAB — CBC WITH DIFFERENTIAL/PLATELET
Abs Immature Granulocytes: 0.01 10*3/uL (ref 0.00–0.07)
Basophils Absolute: 0 10*3/uL (ref 0.0–0.1)
Basophils Relative: 1 %
Eosinophils Absolute: 0.1 10*3/uL (ref 0.0–0.5)
Eosinophils Relative: 1 %
HCT: 34.2 % — ABNORMAL LOW (ref 36.0–46.0)
Hemoglobin: 10.5 g/dL — ABNORMAL LOW (ref 12.0–15.0)
Immature Granulocytes: 0 %
Lymphocytes Relative: 34 %
Lymphs Abs: 2.4 10*3/uL (ref 0.7–4.0)
MCH: 26.2 pg (ref 26.0–34.0)
MCHC: 30.7 g/dL (ref 30.0–36.0)
MCV: 85.3 fL (ref 80.0–100.0)
Monocytes Absolute: 0.4 10*3/uL (ref 0.1–1.0)
Monocytes Relative: 6 %
Neutro Abs: 4 10*3/uL (ref 1.7–7.7)
Neutrophils Relative %: 58 %
Platelets: 312 10*3/uL (ref 150–400)
RBC: 4.01 MIL/uL (ref 3.87–5.11)
RDW: 15.4 % (ref 11.5–15.5)
WBC: 7 10*3/uL (ref 4.0–10.5)
nRBC: 0 % (ref 0.0–0.2)

## 2019-11-19 LAB — COMPREHENSIVE METABOLIC PANEL
ALT: 14 U/L (ref 0–44)
AST: 14 U/L — ABNORMAL LOW (ref 15–41)
Albumin: 3.6 g/dL (ref 3.5–5.0)
Alkaline Phosphatase: 56 U/L (ref 38–126)
Anion gap: 8 (ref 5–15)
BUN: 9 mg/dL (ref 6–20)
CO2: 22 mmol/L (ref 22–32)
Calcium: 8.8 mg/dL — ABNORMAL LOW (ref 8.9–10.3)
Chloride: 110 mmol/L (ref 98–111)
Creatinine, Ser: 1.07 mg/dL — ABNORMAL HIGH (ref 0.44–1.00)
GFR calc Af Amer: >60 mL/min (ref >60)
GFR calc non Af Amer: >60 mL/min (ref >60)
Glucose, Bld: 113 mg/dL — ABNORMAL HIGH (ref 70–99)
Potassium: 3.6 mmol/L (ref 3.5–5.1)
Sodium: 140 mmol/L (ref 135–145)
Total Bilirubin: 0.2 mg/dL — ABNORMAL LOW (ref 0.3–1.2)
Total Protein: 6.4 g/dL — ABNORMAL LOW (ref 6.5–8.1)

## 2019-11-19 LAB — RAPID URINE DRUG SCREEN, HOSP PERFORMED
Amphetamines: NOT DETECTED
Barbiturates: NOT DETECTED
Benzodiazepines: NOT DETECTED
Cocaine: NOT DETECTED
Opiates: NOT DETECTED
Tetrahydrocannabinol: NOT DETECTED

## 2019-11-19 LAB — PHOSPHORUS: Phosphorus: 2.3 mg/dL — ABNORMAL LOW (ref 2.5–4.6)

## 2019-11-19 LAB — HIV ANTIBODY (ROUTINE TESTING W REFLEX): HIV Screen 4th Generation wRfx: NONREACTIVE

## 2019-11-19 LAB — PROTIME-INR
INR: 1.1 (ref 0.8–1.2)
Prothrombin Time: 13.8 seconds (ref 11.4–15.2)

## 2019-11-19 LAB — MAGNESIUM: Magnesium: 1.8 mg/dL (ref 1.7–2.4)

## 2019-11-19 MED ORDER — ACETAMINOPHEN 650 MG RE SUPP: 650.0000 mg | RECTAL | Status: DC | PRN

## 2019-11-19 MED ORDER — LABETALOL HCL 5 MG/ML IV SOLN: 5.0000 mg | INTRAVENOUS | Status: DC | PRN

## 2019-11-19 MED ORDER — ACETAMINOPHEN 325 MG PO TABS
650.0000 mg | ORAL_TABLET | ORAL | Status: DC | PRN
  Administered 2019-11-19 – 2019-11-23 (×5): 650 mg via ORAL
  Filled 2019-11-19 (×6): qty 2

## 2019-11-19 MED ORDER — SODIUM CHLORIDE 0.9 % IV SOLN: Freq: Once | INTRAVENOUS | Status: AC

## 2019-11-19 MED ORDER — PROCHLORPERAZINE EDISYLATE 10 MG/2ML IJ SOLN
10.0000 mg | Freq: Once | INTRAMUSCULAR | Status: AC
  Administered 2019-11-19: 10 mg via INTRAVENOUS
  Filled 2019-11-19: qty 2

## 2019-11-19 MED ORDER — LORAZEPAM 2 MG/ML IJ SOLN
2.0000 mg | INTRAMUSCULAR | Status: DC | PRN
  Filled 2019-11-19: qty 1

## 2019-11-19 MED ORDER — SODIUM CHLORIDE 0.9% FLUSH
3.0000 mL | Freq: Two times a day (BID) | INTRAVENOUS | Status: DC
  Administered 2019-11-20 – 2019-11-23 (×7): 3 mL via INTRAVENOUS

## 2019-11-19 MED ORDER — KETOROLAC TROMETHAMINE 30 MG/ML IJ SOLN
30.0000 mg | Freq: Three times a day (TID) | INTRAMUSCULAR | Status: AC | PRN
  Administered 2019-11-20 – 2019-11-22 (×6): 30 mg via INTRAVENOUS
  Filled 2019-11-19 (×6): qty 1

## 2019-11-19 MED ORDER — MAGNESIUM SULFATE IN D5W 1-5 GM/100ML-% IV SOLN
1.0000 g | Freq: Once | INTRAVENOUS | Status: AC
  Administered 2019-11-19: 1 g via INTRAVENOUS
  Filled 2019-11-19: qty 100

## 2019-11-19 MED ORDER — ENOXAPARIN SODIUM 40 MG/0.4ML ~~LOC~~ SOLN
40.0000 mg | SUBCUTANEOUS | Status: DC
  Administered 2019-11-19 – 2019-11-23 (×5): 40 mg via SUBCUTANEOUS
  Filled 2019-11-19 (×5): qty 0.4

## 2019-11-19 NOTE — Progress Notes (Signed)
EMU LTM started/ no skin breakdown. Educated the patient and family on the event button. Pt is being monitor via [facility]

## 2019-11-19 NOTE — H&P (Signed)
CC: Convulsions  History is obtained from: Patient and mother at bedside  HPI: Jill [name] is a 34 y.o. female with history of chronic daily headaches and convulsions who was admitted to epilepsy monitoring unit for characterization of spells.  Patient states started having these episodes in 2017 and have gradually worsened.  Type I Description: Staring off, does not respond, can fall if standing up or walking Duration: 3 to 5 minutes Aura: None Frequency: Twice per week Last episode: 11/15/2019  Type II Description: Whole body shaking, eyes rolled back, foaming at mouth, occasionally urinary incontinence, no tongue bite Duration: 3-5 minutes followed by confusion for about 30 minutes afterwards Aura: None  Frequency: 3 times per week Last episode: 11/11/2019  Injuries: Reports falling during episodes but denies any head injury with loss of consciousness, fractures, etc.  Epilepsy risk factors: Denies febrile seizures, meningitis/encephalitis, head injury with loss of consciousness, family history of epilepsy  Routine EEG 05/11/2017: This awake and asleep EEG is abnormal due to: 1.  Generalized epileptiform discharges 2.  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  Clinical correlation advised.   Routine EEG 10/11/2019: IMPRESSION: This is an awake and sleep abnormal EEG due to  presence of frequent left frontocentral predominant spike and  wave discharge.  CLINICAL CORRELATION: This EEG finding supports the seizure focus  from left frontocentral region. If clinically indicated follow up  video/EEG monitoring should be considered.  CT head without contrast 04/13/2019: No acute intracranial abnormality  MRI brain with and without contrast 10/12/2016: Normal MRI appearance of the brain.   ROS: A 14 point ROS was  performed and is negative except as noted in the HPI.    Past Medical History:  Diagnosis Date  . Anemia   . Headache    migrains  . Seizures ([place])     Family History  Problem Relation Age of Onset  . Healthy Mother   . Healthy Father   . Cancer Neg Hx   . Thyroid disease Neg Hx   . Diabetes Neg Hx     Social History:  reports that she has quit smoking. She has never used smokeless tobacco. She reports that she does not drink alcohol or use drugs.  Exam: Current vital signs: BP 125/75 (BP Location: Left Arm)   Pulse (!) 104   Resp 17   SpO2 99%  Vital signs in last 24 hours: Pulse Rate:  [104] 104 (03/08 1003) Resp:  [17] 17 (03/08 1003) BP: (125)/(75) 125/75 (03/08 1003) SpO2:  [99 %] 99 % (03/08 1003)   Physical Exam  Constitutional: Appears well-developed and well-nourished.  Psych: Affect appropriate to situation Eyes: No scleral injection HENT: No OP obstrucion Head: Normocephalic.  Cardiovascular: Normal rate and regular rhythm.  Respiratory: Effort normal, non-labored breathing GI: Soft.  No distension. There is no tenderness.  Skin: WDI  Neuro: Mental Status: Patient is awake, alert, oriented to person, place, month, year, and situation. Patient is able to give a clear and coherent history. No signs of aphasia or neglect Cranial Nerves: II: Visual Fields are full. Pupils are equal, round, and reactive to light.  III,IV, VI: EOMI without ptosis or diploplia.  V: Facial sensation is symmetric to temperature VII: Facial movement is symmetric.  VIII: hearing is intact to voice X: Uvula elevates symmetrically XI: Shoulder shrug is symmetric. XII: tongue is midline without  atrophy or fasciculations.  Motor: Tone is normal. Bulk is normal. 5/5 strength was present in all four extremities.  Sensory: Sensation is symmetric to light touch and temperature in the arms and legs Deep Tendon Reflexes: 2+ and symmetric in the biceps and  patellae. Plantars Toes are downgoing bilaterally. Cerebellar: FNF and HKS are intact bilaterally   I have reviewed labs in epic and the results pertinent to this consultation are: Hemoglobin 10.5, hematocrit 34.2, normal MCV and MCH Blood glucose 113, creatinine 1.07   I have reviewed the images obtained:  CT head without contrast 04/13/2019: No acute intracranial abnormality  MRI brain with and without contrast 10/12/2016: Normal MRI appearance of the brain.   ASSESSMENT/PLAN: 34 year old female with abnormal EEG, normal MRI with staring spells and generalized tonic-clonic seizure-like episodes admitted to epilepsy monitoring unit for characterization of spells.  Convulsions -Start video EEG monitoring for characterization of spells -Took Keppra this morning, will hold evening dose of Keppra. -We will do photic stimulation, hyperventilation as well as sleep deprivation tomorrow as seizure provocative measures if needed -Continue seizure precautions -As needed IV Ativan 2 mg for generalized tonic-clonic seizure lasting more than 2 minutes or focal seizure lasting more than 5 minutes  Headache -Patient reporting headache currently.  Next-we will give IV Toradol, Compazine, magnesium -As needed Tylenol, Toradol if needed  Disposition: Pending characterization of spells   Joaquim Tolen Annabelle Harman

## 2019-11-19 NOTE — Progress Notes (Signed)
Patient arrived to 3w12 for EMU. A&O x4. Mother at bedside. EEG tech at bedside.

## 2019-11-19 NOTE — Progress Notes (Signed)
EEG maintenance. No skin breakdown at electrode site FP1 FP2. Pt's needs are met at this time. Continue to monitor

## 2019-11-20 LAB — URINALYSIS, ROUTINE W REFLEX MICROSCOPIC
Bilirubin Urine: NEGATIVE
Glucose, UA: NEGATIVE mg/dL
Hgb urine dipstick: NEGATIVE
Ketones, ur: NEGATIVE mg/dL
Leukocytes,Ua: NEGATIVE
Nitrite: NEGATIVE
Protein, ur: NEGATIVE mg/dL
Specific Gravity, Urine: 1.015 (ref 1.005–1.030)
pH: 6 (ref 5.0–8.0)

## 2019-11-20 MED ORDER — NON FORMULARY: 3.0000 mg | Freq: Every evening | Status: DC | PRN

## 2019-11-20 MED ORDER — MELATONIN 3 MG PO TABS
3.0000 mg | ORAL_TABLET | Freq: Every evening | ORAL | Status: DC | PRN
  Administered 2019-11-20: 3 mg via ORAL
  Filled 2019-11-20 (×2): qty 1

## 2019-11-20 MED ORDER — HYDROXYZINE HCL 50 MG/ML IM SOLN
25.0000 mg | Freq: Every evening | INTRAMUSCULAR | Status: DC | PRN
  Filled 2019-11-20: qty 0.5

## 2019-11-20 NOTE — Procedures (Addendum)
Patient Name: Jill [name]  MRN: [id number]  Epilepsy Attending: Charlsie Quest  Referring Physician/Provider: Dr. Lindie Spruce Duration: 11/19/2019 8295 to 11/20/2019 0929  Patient history: 34 year old female with episodes of staring spells and generalized tonic-clonic seizures admitted for characterization of spells.  Level of alertness: Awake, sleep  AEDs during EEG study: Keppra  Technical aspects: This EEG study was done with scalp electrodes positioned according to the 10-20 International system of electrode placement. Electrical activity was acquired at a sampling rate of 500Hz  and reviewed with a high frequency filter of 70Hz  and a low frequency filter of 1Hz . EEG data were recorded continuously and digitally stored.   Description: The posterior dominant rhythm consists of 10 Hz activity of moderate voltage (25-35 uV) seen predominantly in posterior head regions, symmetric and reactive to eye opening and eye closing.  Sleep was characterized by vertex waves, sleep spindles (12 to 14 Hz), maximal frontocentral.  Frequent spikes and polyspikes were seen in left frontocentral region maximal Fp1, F3, at times and runs, more frequent during sleep.  Intermittent generalized and lateralized left hemisphere sharply contoured 5 to 6 Hz theta slowing was also noted.Hyperventilation and photic stimulation were not performed.  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 No seizures were seen throughout the recording.  Chara Marquard 

## 2019-11-20 NOTE — Progress Notes (Signed)
Per pt's mother pt had episodes as follows: 2150 lip twitching 1 min duration 2158 R arm twitching 1 min duration 2219 spit foaming in mouth (pt alert and oriented) Pt's mother reminded to press the button for all abnormal seizure like activity and to also call RN at number posted on white board when such activity happens.

## 2019-11-20 NOTE — Progress Notes (Addendum)
Subjective: No acute events overnight.  States headache improved after Toradol and magnesium but still has baseline headache.  Also reports feeling anxious, lightheaded and trouble sleeping overnight.  No other concerns.   ROS: negative except above  Examination  Vital signs in last 24 hours: Temp:  [98.1 F (36.7 C)-98.6 F (37 C)] 98.6 F (37 C) (03/09 0834) Pulse Rate:  [83-118] 83 (03/09 0834) Resp:  [10-26] 14 (03/09 0834) BP: (106-125)/(64-80) 114/72 (03/09 0834) SpO2:  [91 %-100 %] 100 % (03/09 0834)  General: lying in bed, not in apparent distress CVS: pulse-normal rate and rhythm RS: breathing comfortably Extremities: normal , warm  Neuro: MS: Alert, oriented, follows commands CN: pupils equal and reactive,  EOMI, face symmetric, tongue midline, normal sensation over face, Motor: 5/5 strength in all 4 extremities Reflexes: 2+ bilaterally over patella, biceps, plantars: flexor Coordination: normal Gait: not tested  Basic Metabolic Panel: Recent Labs  Lab 11/19/19 0938  NA 140  K 3.6  CL 110  CO2 22  GLUCOSE 113*  BUN 9  CREATININE 1.07*  CALCIUM 8.8*  MG 1.8  PHOS 2.3*    CBC: Recent Labs  Lab 11/19/19 0938  WBC 7.0  NEUTROABS 4.0  HGB 10.5*  HCT 34.2*  MCV 85.3  PLT 312     Coagulation Studies: Recent Labs    11/19/19 1414  LABPROT 13.8  INR 1.1    Imaging No new brain imaging  ASSESSMENT AND PLAN: 34 year old female with abnormal EEG, normal MRI with staring spells and generalized tonic-clonic seizure-like episodes admitted to epilepsy monitoring unit for characterization of spells.  Convulsions Epilepsy -Continue video EEG monitoring for characterization of spells -Holding Keppra today -We will do photic stimulation, hyperventilation as well as sleep deprivation tomorrow as seizure provocative measures if needed -Continue seizure precautions -As needed IV Ativan 2 mg for generalized tonic-clonic seizure lasting more than 2  minutes or focal seizure lasting more than 5 minutes  Headache -As needed Tylenol, Toradol if needed  Anxiety - PRN vistaril  Disposition: Pending characterization of spells   I have spent a total of  35 minutes with the patient reviewing hospital notes,  test results, labs and examining the patient as well as establishing an assessment and plan that was discussed personally with the patient and mother.  > 50% of time was spent in direct patient care.    Alyxandria Wentz Annabelle Harman

## 2019-11-20 NOTE — Plan of Care (Signed)

## 2019-11-20 NOTE — Progress Notes (Signed)
EEG maint is complete. No skin breakdown at electrode site Fp1 Fp2 A2 P7. Continue to monitor

## 2019-11-21 ENCOUNTER — Other Ambulatory Visit: Payer: Self-pay

## 2019-11-21 ENCOUNTER — Encounter (HOSPITAL_COMMUNITY): Payer: Self-pay | Admitting: Neurology

## 2019-11-21 NOTE — Progress Notes (Signed)
Subjective: No definite seizures overnight. Reported episode of lip twitching, right arm twitching and spit foaming in mouth, lasting 1 min each between 2150 to 2219. Also reports headache.   ROS: negative except above  Examination  Vital signs in last 24 hours: Temp:  [98.2 F (36.8 C)-99.7 F (37.6 C)] 98.8 F (37.1 C) (03/10 0854) Pulse Rate:  [84-107] 91 (03/10 0854) Resp:  [12-16] 12 (03/10 0854) BP: (97-116)/(59-72) 108/72 (03/10 0854) SpO2:  [97 %-100 %] 98 % (03/10 0854) Weight:  [102.9 kg] 102.9 kg (03/10 0603)  General: lying in bed, not in apparent distress CVS: pulse-normal rate and rhythm RS: breathing comfortably Extremities: normal , warm  Neuro: MS: Alert, oriented, follows commands CN: pupils equal and reactive,  EOMI, face symmetric, tongue midline, normal sensation over face, Motor: 5/5 strength in all 4 extremities Reflexes: 2+ bilaterally over patella, biceps, plantars: flexor Coordination: normal Gait: not tested  Basic Metabolic Panel: Recent Labs  Lab 11/19/19 0938  NA 140  K 3.6  CL 110  CO2 22  GLUCOSE 113*  BUN 9  CREATININE 1.07*  CALCIUM 8.8*  MG 1.8  PHOS 2.3*    CBC: Recent Labs  Lab 11/19/19 0938  WBC 7.0  NEUTROABS 4.0  HGB 10.5*  HCT 34.2*  MCV 85.3  PLT 312     Coagulation Studies: Recent Labs    11/19/19 1414  LABPROT 13.8  INR 1.1    Imaging No new brain imaging  ASSESSMENT AND PLAN: 34 year old female with abnormal EEG, normal MRI with staring spells and generalized tonic-clonic seizure-like episodes admitted to epilepsy monitoring unit for characterization of spells.  Convulsions Epilepsy -Continue video EEG monitoring for characterization of spells -Continue to hold Keppra today -We will do photic stimulation,hyperventilation as well as sleep deprivation today as seizure provocative measures -Continue seizure precautions -As needed IV Ativan 2 mg for generalized tonic-clonic seizure lasting more  than 2 minutes or focal seizure lasting more than 5 minutes  Headache -As needed Tylenol, Toradol if needed  Anxiety - PRN vistaril  Insomnia - ordered melatonin  Disposition: Pending characterization of spells  I have spent a total of 25 minuteswith the patient reviewing hospitalnotes,  test results, labs and examining the patient as well as establishing an assessment and plan that was discussed personally with the patient and mother.>50% of time was spent in direct patient care.   Coner Gibbard Annabelle Harman

## 2019-11-21 NOTE — Progress Notes (Signed)
EMU Lead maintenance  No skin breakdown noted at FP1  FP2  F8  Fz  A2   FP1 and FP2 moved upward 1cm

## 2019-11-21 NOTE — Procedures (Signed)
Patient Name: Jill [name]  MRN: [id number]  Epilepsy Attending: Charlsie Quest  Referring Physician/Provider: Dr. Lindie Spruce Duration: 11/20/2019 0932 to 11/21/2019 0929  Patient history: 34 year old female with episodes of staring spells and generalized tonic-clonic seizures admitted for characterization of spells.  Level of alertness: Awake, sleep  AEDs during EEG study: Keppra  Technical aspects: This EEG study was done with scalp electrodes positioned according to the 10-20 International system of electrode placement. Electrical activity was acquired at a sampling rate of 500Hz  and reviewed with a high frequency filter of 70Hz  and a low frequency filter of 1Hz . EEG data were recorded continuously and digitally stored.   Description: The posterior dominant rhythm consists of 10 Hz activity of moderate voltage (25-35 uV) seen predominantly in posterior head regions, symmetric and reactive to eye opening and eye closing.  Sleep was characterized by vertex waves, sleep spindles (12 to 14 Hz), maximal frontocentral.  Frequent spikes and polyspikes were seen in left frontocentral region maximal Fp1, F3, at times and runs, more frequent during sleep.  Intermittent generalized and lateralized left hemisphere sharply contoured 5 to 6 Hz theta slowing was also noted.Hyperventilation and photic stimulation were not performed.  Abnormalities - Spikes and polyspikes, left frontocentral region - Intermittent slow, generalized and lateralized left hemisphere  IMPRESSION: This study showed evidence of epilepsy and cortical dysfunction arising from left frontocentral region. No seizures were seen throughout the recording.  [name] 

## 2019-11-22 MED ORDER — ONDANSETRON HCL 4 MG/2ML IJ SOLN
8.0000 mg | Freq: Once | INTRAMUSCULAR | Status: AC
  Administered 2019-11-22: 8 mg via INTRAVENOUS
  Filled 2019-11-22: qty 4

## 2019-11-22 MED ORDER — SODIUM CHLORIDE 0.9 % IV SOLN
8.0000 mg | Freq: Once | INTRAVENOUS | Status: DC
  Filled 2019-11-22: qty 4

## 2019-11-22 MED ORDER — SODIUM CHLORIDE 0.9 % IV SOLN: 8.0000 mg | Freq: Once | INTRAVENOUS | Status: DC

## 2019-11-22 MED ORDER — DM-GUAIFENESIN ER 30-600 MG PO TB12: 1.0000 | ORAL_TABLET | Freq: Two times a day (BID) | ORAL | Status: DC | PRN

## 2019-11-22 NOTE — Procedures (Signed)
Patient Name:[name] FVW:[id number] Epilepsy Attending:Carlito Bogert Annabelle Harman Referring Physician/Provider:Dr. Lindie Spruce Duration:11/21/2019 [phone] to3/07/2020 [phone]  Patient history:34 year old female with episodes of staring spells and generalized tonic-clonic seizures admitted for characterization of spells.  Level of alertness:Awake, sleep  AEDs during EEG study:Keppra  Technical aspects: This EEG study was done with scalp electrodes positioned according to the 10-20 International system of electrode placement. Electrical activity was acquired at a sampling rate of 500Hz  and reviewed with a high frequency filter of 70Hz  and a low frequency filter of 1Hz . EEG data were recorded continuously and digitally stored.  Description:The posterior dominant rhythm consists of 10 Hz activity of moderate voltage (25-35 uV) seen predominantly in posterior head regions, symmetric and reactive to eye opening and eye closing.Sleep was characterized by vertex waves, sleep spindles (12 to 14 Hz), maximal frontocentral.Frequent spikes and polyspikes were seen in left frontocentral region maximalFp1, F3,at times and runs,more frequent during sleep. Intermittent generalized and lateralized left hemisphere sharply contoured 5 to 6 Hz theta slowing was also noted.Hyperventilation was not performed.  Patient had an event during photic stimulation.  She had a whole-body nonrhythmic twitching, eyes closed, not following commands initially.  Concomitant EEG before during and after the event did not show any EEG change to suggest seizure.  Abnormalities - Spikes and polyspikes, left frontocentral region - Intermittent slow, generalized and lateralized left hemisphere  IMPRESSION: This studyshowed evidence of epilepsy and cortical dysfunction arising from left frontocentral region.No seizures were seen throughout the recording.  One event of whole body nonrhythmic twitching, eyes  closed not following commands was noted during photic stimulation without concomitant EEG change and was a nonepileptic event.  Jill [name] 

## 2019-11-22 NOTE — Progress Notes (Signed)
Per instructions from [place] monitoring center, wall monitor was turned off and back on to reboot. Awaiting return call.

## 2019-11-22 NOTE — Progress Notes (Signed)
LTM maint complete - no skin breakdown under:  Pz, t6, a2, p4

## 2019-11-22 NOTE — Progress Notes (Signed)
Patient has 10/10 headache. Toradol given. Also patient is very nauseous and feels like she needs to throw up. Dr. Melynda Ripple notified and ordered Zofran IV. Tyron Russell Topher [place]

## 2019-11-22 NOTE — Progress Notes (Signed)
Subjective: No acute events overnight.  Reports mild cough.  No other concerns.  ROS: negative except above  Examination  Vital signs in last 24 hours: Temp:  [98.3 F (36.8 C)-99 F (37.2 C)] 98.5 F (36.9 C) (03/11 0825) Pulse Rate:  [81-102] 97 (03/11 0825) Resp:  [15-18] 16 (03/11 0825) BP: (100-116)/(65-76) 100/66 (03/11 0825) SpO2:  [98 %-100 %] 100 % (03/11 0825)  General: lying in bed,not in apparent distress CVS: pulse-normal rate and rhythm RS: breathing comfortably Extremities: normal,warm  Neuro: MS: Alert, oriented, follows commands CN: pupils equal and reactive, EOMI, face symmetric, tongue midline, normal sensation over face, Motor: 5/5 strength in all 4 extremities Reflexes: 2+ bilaterally over patella, biceps, plantars: flexor Coordination: normal Gait: not tested  Basic Metabolic Panel: Recent Labs  Lab 11/19/19 0938  NA 140  K 3.6  CL 110  CO2 22  GLUCOSE 113*  BUN 9  CREATININE 1.07*  CALCIUM 8.8*  MG 1.8  PHOS 2.3*    CBC: Recent Labs  Lab 11/19/19 0938  WBC 7.0  NEUTROABS 4.0  HGB 10.5*  HCT 34.2*  MCV 85.3  PLT 312     Coagulation Studies: Recent Labs    11/19/19 1414  LABPROT 13.8  INR 1.1    Imaging No new brain imaging  ASSESSMENT AND PLAN:34 year old female with abnormal EEG, normal MRI with staring spells and generalized tonic-clonic seizure-like episodes admitted to epilepsy monitoring unit for characterization of spells.  Convulsions Epilepsy -Continue video EEG monitoring for characterization of spells -Continue to hold Keppra today -We will do hyperventilation as well as sleep deprivation today as seizure provocative measures -Continue seizure precautions -As needed IV Ativan 2 mg for generalized tonic-clonic seizure lasting more than 2 minutes or focal seizure lasting more than 5 minutes  Headache -As needed Tylenol, Toradol if needed  Anxiety - PRN vistaril  Insomnia - ordered  melatonin  Cough -As needed Mucinex ordered  Disposition: Pending characterization of spells  I have spent a total of46minuteswith the patient reviewing hospitalnotes, test results, labs and examining the patient as well as establishing an assessment and plan that was discussed personally with the patient and mother.>50% of time was spent in direct patient care.   Ebert Forrester Annabelle Harman

## 2019-11-22 NOTE — Progress Notes (Signed)
Monitoring center from [place] called and stated unable to pick up signal for pt. Charge nurse notified.

## 2019-11-22 NOTE — Progress Notes (Signed)
Per Rosey Bath from [place] monitoring center, reboot was successful and pt's equipment is now back online.

## 2019-11-23 DIAGNOSIS — G40009 Localization-related (focal) (partial) idiopathic epilepsy and epileptic syndromes with seizures of localized onset, not intractable, without status epilepticus: Secondary | ICD-10-CM

## 2019-11-23 MED ORDER — INFLUENZA VAC SPLIT QUAD 0.5 ML IM SUSY
0.5000 mL | PREFILLED_SYRINGE | Freq: Once | INTRAMUSCULAR | Status: AC
  Administered 2019-11-23: 0.5 mL via INTRAMUSCULAR
  Filled 2019-11-23: qty 0.5

## 2019-11-23 MED ORDER — LEVETIRACETAM IN NACL 1000 MG/100ML IV SOLN
1000.0000 mg | Freq: Once | INTRAVENOUS | Status: AC
  Administered 2019-11-23: 1000 mg via INTRAVENOUS
  Filled 2019-11-23: qty 100

## 2019-11-23 MED ORDER — ZONISAMIDE 100 MG PO CAPS: 100.0000 mg | ORAL_CAPSULE | Freq: Every day | ORAL | 1 refills | Status: DC

## 2019-11-23 NOTE — Discharge Summary (Addendum)
Physician Discharge Summary  Patient ID: Jill [name] MRN: [id number] DOB/AGE: 32-Apr-1987 32 y.o.  Admit date: 11/19/2019 Discharge date: 11/23/2019  Admission Diagnoses: Convulsions  Discharge Diagnoses: Focal epilepsy, nonepileptic spells  Discharged Condition: stable  Hospital Course: Jill [name] was admitted to epilepsy monitoring unit from 11/19/2019 to 11/23/2019.  During this stay she underwent video EEG monitoring.  Her home antiseizure medication Keppra was discontinued on day of admission.  She also had photic stimulation, hyperventilation and sleep deprivation as seizure provocative maneuvers.  One event was recorded during photic stimulation. She had a whole-body nonrhythmic twitching, eyes closed, not following commands initially.  Concomitant EEG before during and after the event did not show any EEG change to suggest seizure.  Of note, per mother this was NOT her typical episode. Given her EEG findings, I am concerned patient might have both epileptic and non epileptic events. Patient is unable to stay longer to capture events. Therefore, will resume home dose of Keppra. Also patient has chronic headaches.  Therefore, after discussing with patient's primary neurologist Dr. Manuella Ghazi, patient was started on zonisamide 100 mg daily at bedtime.  Discussed management of seizures and non epileptic events. Seizure precautions including DO NOT DRIVE were also discussed    Consults: None  Significant Diagnostic Studies: EEG  Description:The posterior dominant rhythm consists of 10 Hz activity of moderate voltage (25-35 uV) seen predominantly in posterior head regions, symmetric and reactive to eye opening and eye closing.Sleep was characterized by vertex waves, sleep spindles (12 to 14 Hz), maximal frontocentral.Frequent spikes and polyspikes were seen in left frontocentral region maximalFp1, F3,at times and runs,more frequent during sleep. Intermittent generalized and lateralized  left hemisphere sharply contoured 5 to 6 Hz theta slowing was also noted.  No EEG change was seen during hyperventilation.  Patient had an event during photic stimulation.  She had a whole-body nonrhythmic twitching, eyes closed, not following commands initially.  Concomitant EEG before during and after the event did not show any EEG change to suggest seizure.   Abnormalities -Spikes and polyspikes, left frontocentral region, maximal F3> FP1> F7 - Intermittent slow, generalized and lateralized left hemisphere  IMPRESSION: This studyshowed evidence of epilepsy and cortical dysfunction arising from left frontocentral region.No seizures were seen throughout the recording.  One event of whole body nonrhythmic twitching, eyes closed not following commands was noted during photic stimulation without concomitant EEG change and was a nonepileptic event.         Treatments: Continue Keppra at home dose  Discharge Exam: Blood pressure 98/66, pulse 79, temperature 98.3 F (36.8 C), temperature source Oral, resp. rate 17, height 5\' 3"  (1.6 m), weight 102.9 kg, SpO2 98 %.   General: lying in bed,not in apparent distress CVS: pulse-normal rate and rhythm RS: breathing comfortably Extremities: normal,warm  Neuro: MS: Alert, oriented, follows commands CN: pupils equal and reactive, EOMI, face symmetric, tongue midline, normal sensation over face, Motor: 5/5 strength in all 4 extremities Reflexes: 2+ bilaterally over patella, biceps, plantars: flexor Coordination: normal Gait: not tested  Discharge disposition: 01-Home or Self Care   Discharge Instructions    Call MD for:   Complete by: As directed    Breakthrough seizure, medications side effects   Diet - low sodium heart healthy   Complete by: As directed    Increase activity slowly   Complete by: As directed      Allergies as of 11/23/2019   No Known Allergies     Medication List    TAKE  these medications     levETIRAcetam 500 MG tablet Commonly known as: KEPPRA Take 2 tablets (1,000 mg total) by mouth 2 (two) times daily. What changed: how much to take   zonisamide 100 MG capsule Commonly known as: ZONEGRAN Take 1 capsule (100 mg total) by mouth daily.         I have spent a total of 35  minutes with the patient reviewing hospital notes,  test results, labs and examining the patient as well as establishing an assessment and plan that was discussed personally with the patient and her mother.  > 50% of time was spent in direct patient care.       Signed: Charlsie Quest 11/23/2019, 1:03 PM

## 2019-11-23 NOTE — Discharge Instructions (Addendum)
You were admitted to the epilepsy monitoring unit from 11/19/2019 to 11/23/2019.  During this time, we recorded abnormal brain waves suggestive of potential seizures arising from left side of your brain.  We also recorded 1 nonepileptic seizure during photic stimulation.  Your antiseizure medication Keppra was restarted at time of discharge.  After discussion with your primary neurologist Dr. Sherryll Burger, you were also started on zonisamide 100 mg daily at bedtime.  Side effects of zonisamide were discussed.  Please follow-up with Dr. Sherryll Burger, your primary neurologist.

## 2019-11-23 NOTE — Procedures (Signed)
Patient Name:Jill [name] YSO:[id number] Epilepsy Attending:Rogina Schiano Annabelle Harman Referring Physician/Provider:Dr. Lindie Spruce Duration:11/22/2019 0929 to3/08/2020 1127  Patient history:33 year old female with episodes of staring spells and generalized tonic-clonic seizures admitted for characterization of spells.  Level of alertness:Awake, sleep  AEDs during EEG study:Keppra  Technical aspects: This EEG study was done with scalp electrodes positioned according to the 10-20 International system of electrode placement. Electrical activity was acquired at a sampling rate of 500Hz  and reviewed with a high frequency filter of 70Hz  and a low frequency filter of 1Hz . EEG data were recorded continuously and digitally stored.  Description:The posterior dominant rhythm consists of 10 Hz activity of moderate voltage (25-35 uV) seen predominantly in posterior head regions, symmetric and reactive to eye opening and eye closing.Sleep was characterized by vertex waves, sleep spindles (12 to 14 Hz), maximal frontocentral.Frequent spikes and polyspikes were seen in left frontocentral region maximalFp1, F3,at times and runs,more frequent during sleep. Intermittent generalized and lateralized left hemisphere sharply contoured 5 to 6 Hz theta slowing was also noted.  No EEG change was seen during hyperventilation.    Abnormalities -Spikes and polyspikes, left frontocentral region, maximal F3> FP1> F7 - Intermittent slow, generalized and lateralized left hemisphere  IMPRESSION: This studyshowed evidence of epilepsy and cortical dysfunction arising from left frontocentral region.No seizures were seen throughout the recording.       Ceira Hoeschen 

## 2019-11-23 NOTE — Progress Notes (Signed)
Notified by patients mother event button was pushed by mistake at [phone]

## 2019-11-23 NOTE — TOC Transition Note (Signed)
Transition of Care [facility]) - CM/SW Discharge Note   Patient Details  Name: RILIE GLANZ MRN: [id number] Date of Birth: 1986-02-25  [facility] of [facility]) CM/SW Contact:  Kermit Balo, RN Phone Number: 11/23/2019, 10:33 AM   Clinical Narrative:    Pt discharging home with self care. No needs per CM. Has transportation home.    Final next level of care: Home/Self Care Barriers to Discharge: No Barriers Identified   Patient Goals and CMS Choice        Discharge Placement                       Discharge Plan and Services                                     Social Determinants of Health (SDOH) Interventions     Readmission Risk Interventions No flowsheet data found.

## 2019-11-23 NOTE — Progress Notes (Signed)
EMU LTM discontinued; mild broken skin at F7 and A1.

## 2019-12-27 ENCOUNTER — Ambulatory Visit: Payer: Medicaid Other

## 2019-12-31 ENCOUNTER — Ambulatory Visit: Payer: Medicaid Other

## 2020-01-01 IMAGING — CT CT HEAD WO/W CM
3 of 4 series · 15 of 47 positions shown, 18 images · IV contrast (omnipaque)
Comparison: 04/14/2017

CLINICAL DATA: New onset seizure.

EXAM:
CT HEAD WITHOUT AND WITH CONTRAST
TECHNIQUE: Contiguous axial images were obtained from the base of the skull
through the vertex without and with intravenous contrast
CONTRAST:  75mL OMNIPAQUE IOHEXOL 300 MG/ML  SOLN

[Series 3: head wo · axial · 0.41mm/px · z∈[-110,+10]mm · 9 of 29 slices shown, 12 images]
[im 3/29  brain]
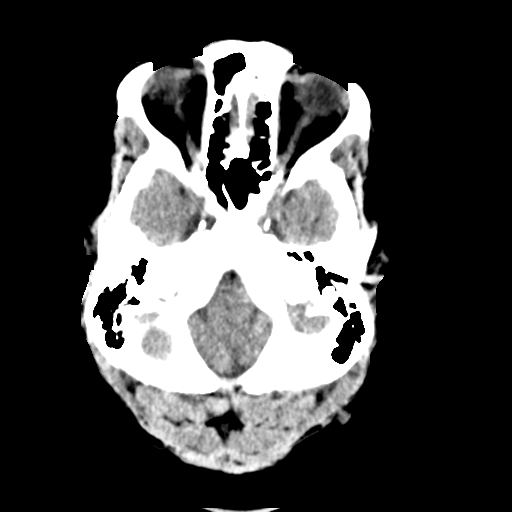
[im 3/29  bone]
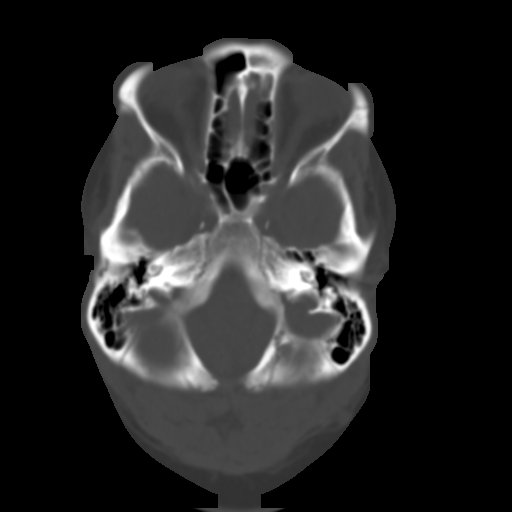
[im 7/29  brain]
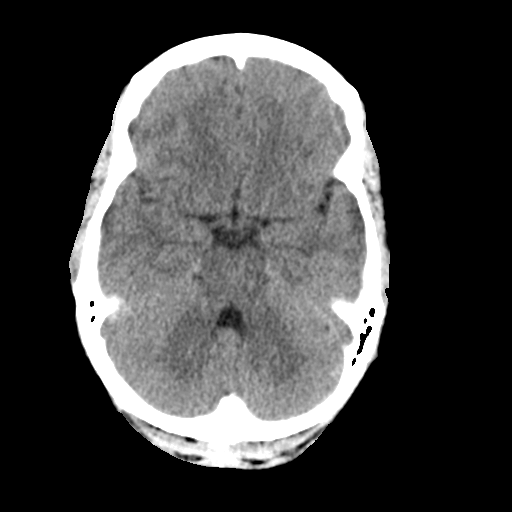
[im 9/29  brain]
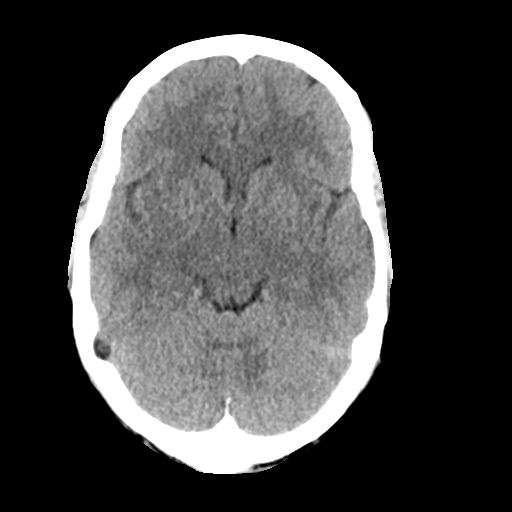
[im 13/29  brain]
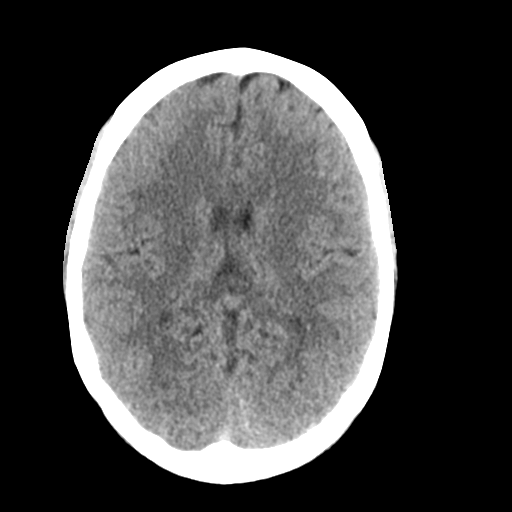
[im 15/29  brain]
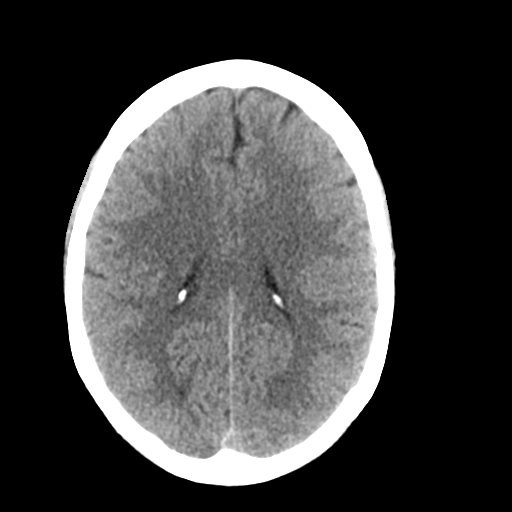
[im 15/29  bone]
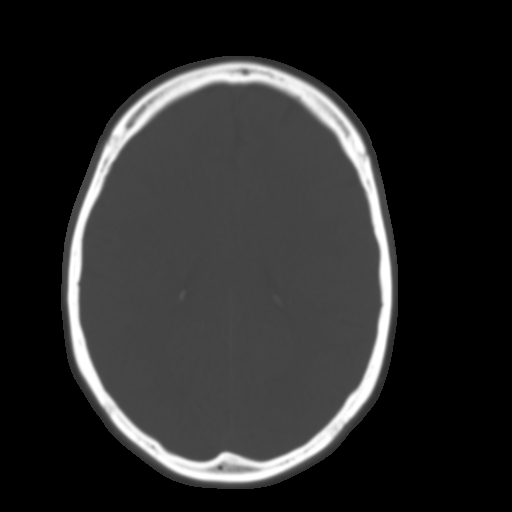
[im 17/29  brain]
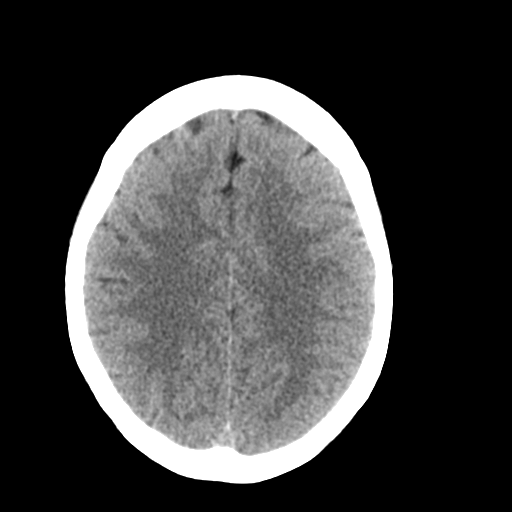
[im 21/29  brain]
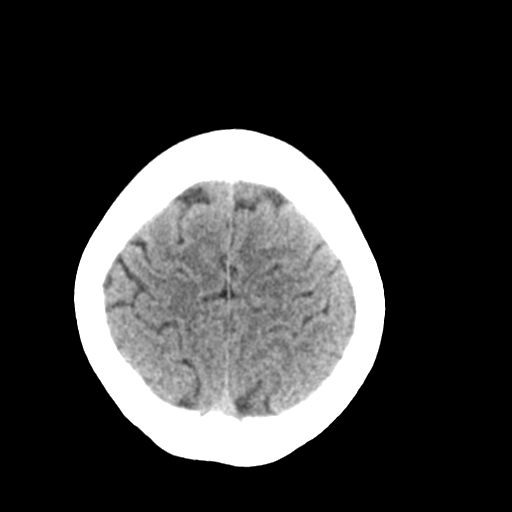
[im 23/29  brain]
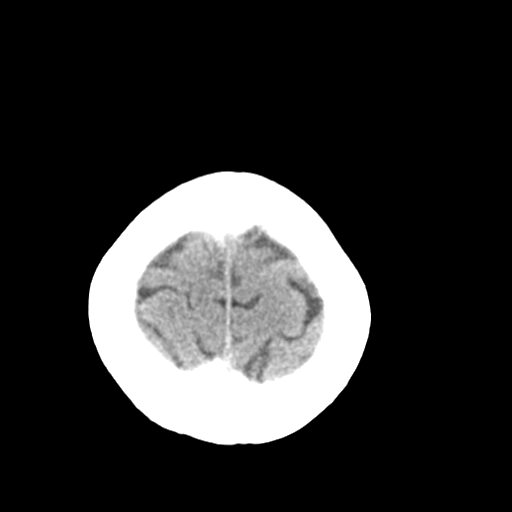
[im 27/29  brain]
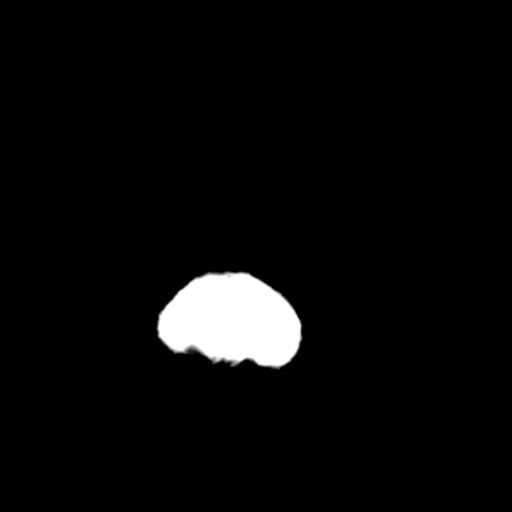
[im 27/29  bone]
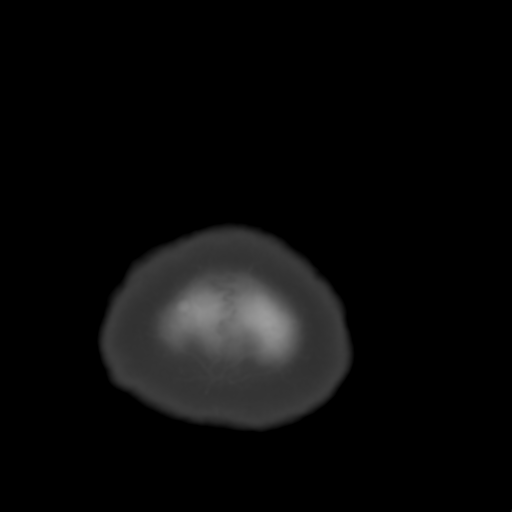

[Series 5: coronal soft tissue · coronal · 0.27mm/px · 3 of 62 slices shown]
[im 21/62  brain]
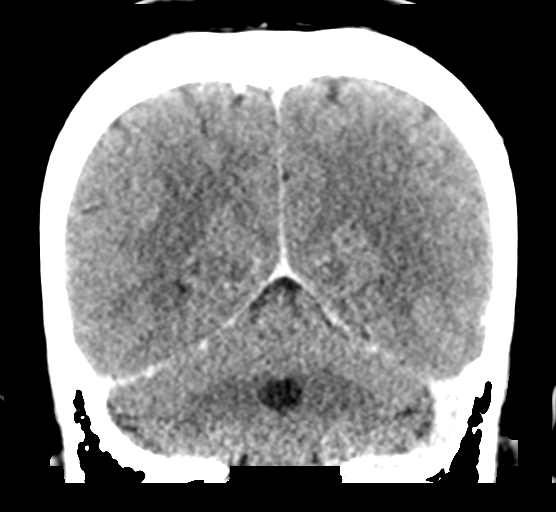
[im 28/62  brain]
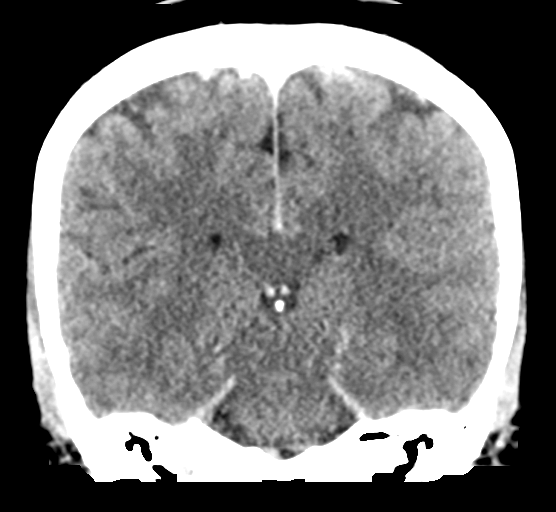
[im 34/62  brain]
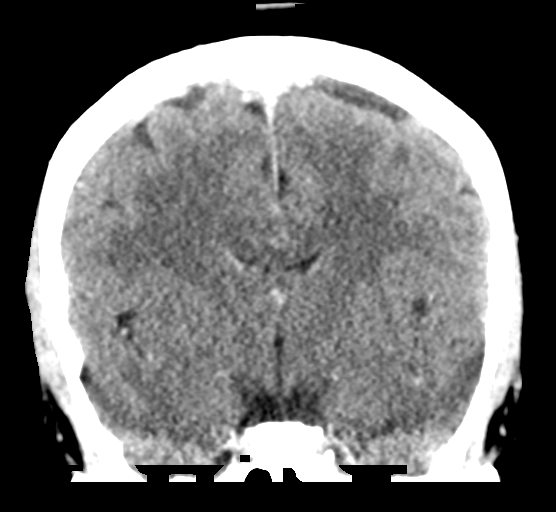

[Series 6: sagittal soft tissue · sagittal · 0.27mm/px · 3 of 51 slices shown]
[im 17/51  brain]
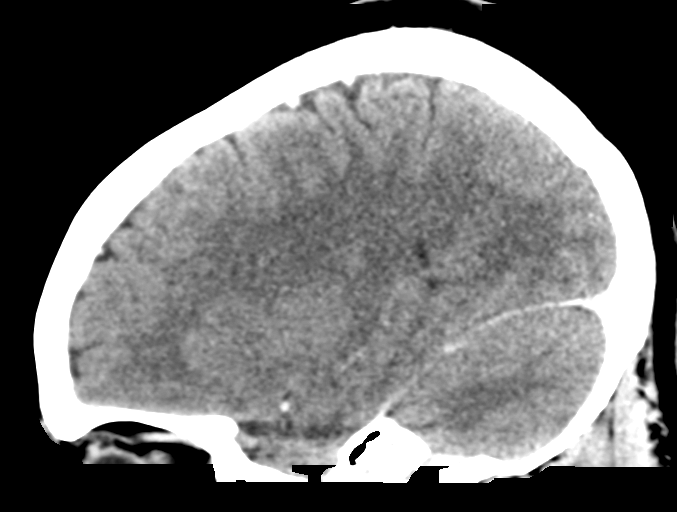
[im 26/51  brain]
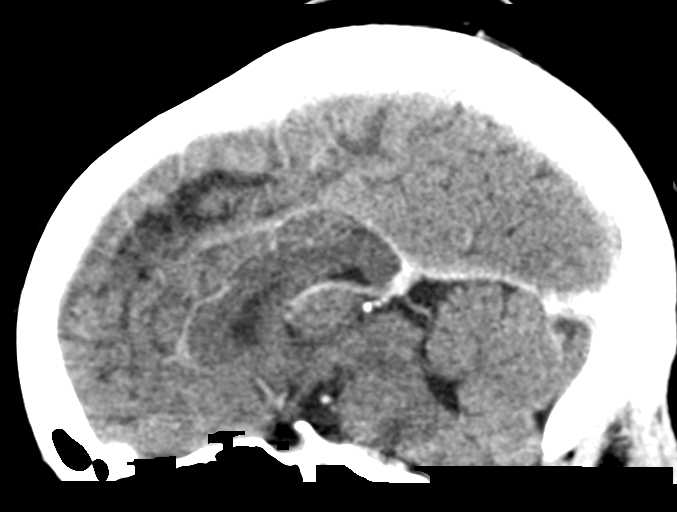
[im 34/51  brain]
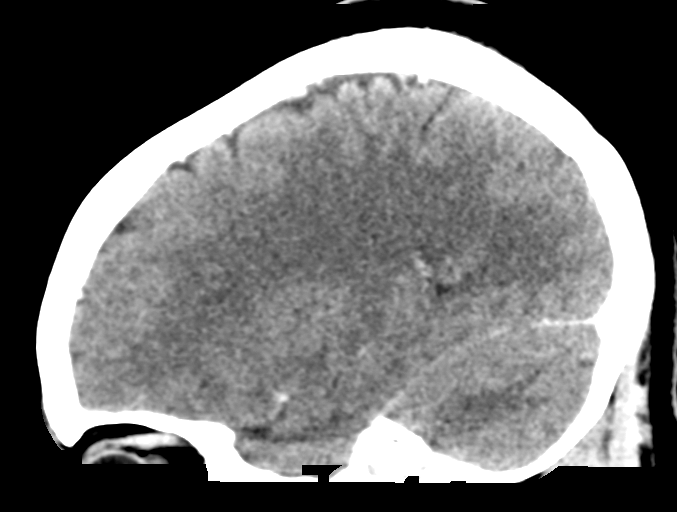

[15 of 47 positions shown; findings below may reference images not displayed]

FINDINGS: Brain: There is no mass, hemorrhage or extra-axial collection. The
size and configuration of the ventricles and extra-axial CSF spaces
are normal. The brain parenchyma is normal, without evidence of
acute or chronic infarction. No abnormal enhancement.

Vascular: No abnormal hyperdensity of the major intracranial
arteries or dural venous sinuses. No intracranial atherosclerosis.

Skull: The visualized skull base, calvarium and extracranial soft
tissues are normal.

Sinuses/Orbits: No fluid levels or advanced mucosal thickening of
the visualized paranasal sinuses. No mastoid or middle ear effusion.
The orbits are normal.
IMPRESSION: Normal CT of the brain with and without contrast.

## 2020-01-04 ENCOUNTER — Ambulatory Visit: Payer: Medicaid Other

## 2020-01-14 ENCOUNTER — Ambulatory Visit: Payer: Medicaid Other

## 2020-01-24 ENCOUNTER — Other Ambulatory Visit: Payer: Self-pay

## 2020-01-24 ENCOUNTER — Emergency Department
Admission: EM | Admit: 2020-01-24 | Discharge: 2020-01-24 | Disposition: A | Discharge status: Home / Self Care | Payer: Medicaid Other | Attending: Emergency Medicine | Admitting: Emergency Medicine

## 2020-01-24 ENCOUNTER — Emergency Department: Payer: Medicaid Other

## 2020-01-24 ENCOUNTER — Encounter: Payer: Self-pay | Admitting: Emergency Medicine

## 2020-01-24 DIAGNOSIS — R079 Chest pain, unspecified: Secondary | ICD-10-CM

## 2020-01-24 DIAGNOSIS — Z20822 Contact with and (suspected) exposure to covid-19: Secondary | ICD-10-CM | POA: Diagnosis not present

## 2020-01-24 DIAGNOSIS — Z87891 Personal history of nicotine dependence: Secondary | ICD-10-CM | POA: Diagnosis not present

## 2020-01-24 DIAGNOSIS — R0602 Shortness of breath: Secondary | ICD-10-CM | POA: Diagnosis not present

## 2020-01-24 DIAGNOSIS — N189 Chronic kidney disease, unspecified: Secondary | ICD-10-CM | POA: Insufficient documentation

## 2020-01-24 DIAGNOSIS — R059 Cough, unspecified: Secondary | ICD-10-CM

## 2020-01-24 DIAGNOSIS — R4182 Altered mental status, unspecified: Secondary | ICD-10-CM | POA: Insufficient documentation

## 2020-01-24 DIAGNOSIS — Z79899 Other long term (current) drug therapy: Secondary | ICD-10-CM | POA: Diagnosis not present

## 2020-01-24 LAB — URINALYSIS, COMPLETE (UACMP) WITH MICROSCOPIC
Bilirubin Urine: NEGATIVE
Glucose, UA: NEGATIVE mg/dL
Ketones, ur: NEGATIVE mg/dL
Nitrite: NEGATIVE
Protein, ur: NEGATIVE mg/dL
Specific Gravity, Urine: 1.008 (ref 1.005–1.030)
pH: 7 (ref 5.0–8.0)

## 2020-01-24 LAB — BASIC METABOLIC PANEL
Anion gap: 7 (ref 5–15)
BUN: 8 mg/dL (ref 6–20)
CO2: 23 mmol/L (ref 22–32)
Calcium: 9.3 mg/dL (ref 8.9–10.3)
Chloride: 109 mmol/L (ref 98–111)
Creatinine, Ser: 1.12 mg/dL — ABNORMAL HIGH (ref 0.44–1.00)
GFR calc Af Amer: >60 mL/min (ref >60)
GFR calc non Af Amer: >60 mL/min (ref >60)
Glucose, Bld: 88 mg/dL (ref 70–99)
Potassium: 3.4 mmol/L — ABNORMAL LOW (ref 3.5–5.1)
Sodium: 139 mmol/L (ref 135–145)

## 2020-01-24 LAB — CBC WITH DIFFERENTIAL/PLATELET
Abs Immature Granulocytes: 0.02 10*3/uL (ref 0.00–0.07)
Basophils Absolute: 0.1 10*3/uL (ref 0.0–0.1)
Basophils Relative: 1 %
Eosinophils Absolute: 0.1 10*3/uL (ref 0.0–0.5)
Eosinophils Relative: 1 %
HCT: 35.7 % — ABNORMAL LOW (ref 36.0–46.0)
Hemoglobin: 11.3 g/dL — ABNORMAL LOW (ref 12.0–15.0)
Immature Granulocytes: 0 %
Lymphocytes Relative: 25 %
Lymphs Abs: 1.7 10*3/uL (ref 0.7–4.0)
MCH: 26.2 pg (ref 26.0–34.0)
MCHC: 31.7 g/dL (ref 30.0–36.0)
MCV: 82.6 fL (ref 80.0–100.0)
Monocytes Absolute: 0.5 10*3/uL (ref 0.1–1.0)
Monocytes Relative: 7 %
Neutro Abs: 4.7 10*3/uL (ref 1.7–7.7)
Neutrophils Relative %: 66 %
Platelets: 314 10*3/uL (ref 150–400)
RBC: 4.32 MIL/uL (ref 3.87–5.11)
RDW: 13.6 % (ref 11.5–15.5)
WBC: 7 10*3/uL (ref 4.0–10.5)
nRBC: 0 % (ref 0.0–0.2)

## 2020-01-24 LAB — URINE DRUG SCREEN, QUALITATIVE (ARMC ONLY)
Amphetamines, Ur Screen: NOT DETECTED
Barbiturates, Ur Screen: NOT DETECTED
Benzodiazepine, Ur Scrn: NOT DETECTED
Cannabinoid 50 Ng, Ur ~~LOC~~: NOT DETECTED
Cocaine Metabolite,Ur ~~LOC~~: NOT DETECTED
MDMA (Ecstasy)Ur Screen: NOT DETECTED
Methadone Scn, Ur: NOT DETECTED
Opiate, Ur Screen: NOT DETECTED
Phencyclidine (PCP) Ur S: NOT DETECTED
Tricyclic, Ur Screen: NOT DETECTED

## 2020-01-24 LAB — LIPASE, BLOOD: Lipase: 32 U/L (ref 11–51)

## 2020-01-24 LAB — SARS CORONAVIRUS 2 BY RT PCR (HOSPITAL ORDER, PERFORMED IN ~~LOC~~ HOSPITAL LAB): SARS Coronavirus 2: NEGATIVE

## 2020-01-24 LAB — POCT PREGNANCY, URINE: Preg Test, Ur: NEGATIVE

## 2020-01-24 LAB — SALICYLATE LEVEL: Salicylate Lvl: <7 mg/dL — ABNORMAL LOW (ref 7.0–30.0)

## 2020-01-24 LAB — FIBRIN DERIVATIVES D-DIMER (ARMC ONLY): Fibrin derivatives D-dimer (ARMC): 552.25 ng/mL (FEU) — ABNORMAL HIGH (ref 0.00–499.00)

## 2020-01-24 LAB — ACETAMINOPHEN LEVEL: Acetaminophen (Tylenol), Serum: 12 ug/mL (ref 10–30)

## 2020-01-24 MED ORDER — IOHEXOL 350 MG/ML SOLN
75.0000 mL | Freq: Once | INTRAVENOUS | Status: AC | PRN
  Administered 2020-01-24: 75 mL via INTRAVENOUS

## 2020-01-24 MED ORDER — KETOROLAC TROMETHAMINE 10 MG PO TABS
10.0000 mg | ORAL_TABLET | Freq: Once | ORAL | Status: AC
  Administered 2020-01-24: 10 mg via ORAL
  Filled 2020-01-24: qty 1

## 2020-01-24 MED ORDER — BENZONATATE 100 MG PO CAPS: 100.0000 mg | ORAL_CAPSULE | Freq: Four times a day (QID) | ORAL | 0 refills | Status: AC | PRN

## 2020-01-24 MED ORDER — ONDANSETRON 4 MG PO TBDP: 4.0000 mg | ORAL_TABLET | Freq: Three times a day (TID) | ORAL | 0 refills | Status: AC | PRN

## 2020-01-24 MED ORDER — ONDANSETRON HCL 4 MG/2ML IJ SOLN
4.0000 mg | Freq: Once | INTRAMUSCULAR | Status: AC
  Administered 2020-01-24: 4 mg via INTRAVENOUS
  Filled 2020-01-24: qty 2

## 2020-01-24 MED ORDER — IBUPROFEN 200 MG PO TABS: 600.0000 mg | ORAL_TABLET | Freq: Four times a day (QID) | ORAL | 0 refills | Status: DC | PRN

## 2020-01-24 MED ORDER — BENZONATATE 100 MG PO CAPS
100.0000 mg | ORAL_CAPSULE | Freq: Four times a day (QID) | ORAL | 0 refills | Status: DC | PRN
Start: 2020-01-24 — End: 2020-01-24

## 2020-01-24 MED ORDER — KETOROLAC TROMETHAMINE 30 MG/ML IJ SOLN
INTRAMUSCULAR | Status: AC
  Filled 2020-01-24: qty 1

## 2020-01-24 MED ORDER — SODIUM CHLORIDE 0.9 % IV BOLUS
1000.0000 mL | Freq: Once | INTRAVENOUS | Status: AC
  Administered 2020-01-24: 1000 mL via INTRAVENOUS

## 2020-01-24 NOTE — ED Triage Notes (Signed)
Arrives with mother.  States patient having an allergic reaction to COVID vaccine.  Vaccinated on Tuesday.  Patient was running a fever last night.  ARrives today c/o SOB and loss of vision x 10 minutes.

## 2020-01-24 NOTE — ED Provider Notes (Signed)
-----------------------------------------   4:21 PM on 01/24/2020 -----------------------------------------  I took over care on this patient from Dr. Scotty Court.  The patient was pending CT chest to rule out PE.  It is negative for PE or other acute intrathoracic findings.  On reassessment, the patient appears comfortable and is stable for discharge home.  I counseled her on the results of the work-up.  She requests some medication to help with a cough.  I gave her thorough return precautions, and she expresses understanding.   Dionne Bucy, MD 01/24/20 1622

## 2020-01-24 NOTE — ED Provider Notes (Signed)
[facility] Emergency Department Provider Note  ____________________________________________  Time seen: Approximately 1:11 PM  I have reviewed the triage vital signs and the nursing notes.   HISTORY  Chief Complaint Shortness of Breath and Visual Field Change    HPI Jill [name] is a 34 y.o. female with a past history of migraine headaches and seizures who comes the ED complaining of sharp chest pain shortness of breath and fever that all started last night, constant.  No aggravating factors.  Also has a cough for the last for 5 weeks.  No body aches.  She did have a Covid vaccine 2 days ago.  Denies dysuria frequency urgency.  She also reports bilateral vision loss described as darkening of her vision.  No neck pain or stiffness, no significant headache.  No lateralizing weakness or paresthesia.  No trauma.       Past Medical History:  Diagnosis Date  . Anemia   . Headache    migrains  . Seizures [facility])      Patient Active Problem List   Diagnosis Date Noted  . Convulsion (HCC) 11/19/2019  . Umbilical hernia without obstruction and without gangrene   . Seizures (HCC) 10/09/2018  . Ruptured ectopic pregnancy 10/07/2018  . History of ectopic pregnancy 09/23/2018  . Seizure (HCC) 09/14/2018  . History of absence seizures 04/25/2018  . Chronic kidney disease   . Episode of loss of consciousness   . Abnormal glucose tolerance test (GTT) during pregnancy, antepartum 09/06/2016  . Anemia during pregnancy 07/14/2016  . Trichomoniasis 05/19/2016  . Hx of migraines 04/05/2016  . History of cesarean delivery, currently pregnant 04/05/2016     Past Surgical History:  Procedure Laterality Date  . CESAREAN SECTION     x4  . CESAREAN SECTION N/A 10/07/2016   Procedure: CESAREAN SECTION;  Surgeon: Suzy Bouchard, MD;  Location: [facility] ORS;  Service: Obstetrics;  Laterality: N/A;  . DIAGNOSTIC LAPAROSCOPY WITH REMOVAL OF ECTOPIC PREGNANCY  Left 10/07/2018   Procedure: laparoscopic left salpingectomy;  Surgeon: Hildred Laser, MD;  Location: [facility] ORS;  Service: Gynecology;  Laterality: Left;  . HERNIA REPAIR     umbilical x 2  . UMBILICAL HERNIA REPAIR N/A 10/02/2019   Procedure: HERNIA REPAIR UMBILICAL ADULT;  Surgeon: Henrene Dodge, MD;  Location: [facility] ORS;  Service: General;  Laterality: N/A;     Prior to Admission medications   Medication Sig Start Date End Date Taking? Authorizing Provider  levETIRAcetam (KEPPRA) 500 MG tablet Take 2 tablets (1,000 mg total) by mouth 2 (two) times daily. 10/10/18 09/26/20 Yes Sainani, Rolly Pancake, MD  Vitamin D, Ergocalciferol, (DRISDOL) 1.25 MG (50000 UNIT) CAPS capsule Take 50,000 Units by mouth once a week. 01/15/20  Yes [provider]  zonisamide (ZONEGRAN) 100 MG capsule Take 1 capsule (100 mg total) by mouth daily. 11/23/19 01/24/20 Yes Charlsie Quest, MD  ibuprofen (MOTRIN IB) 200 MG tablet Take 3 tablets (600 mg total) by mouth every 6 (six) hours as needed. 01/24/20   Sharman Cheek, MD  ondansetron (ZOFRAN ODT) 4 MG disintegrating tablet Take 1 tablet (4 mg total) by mouth every 8 (eight) hours as needed for nausea or vomiting. 01/24/20   Sharman Cheek, MD     Allergies Patient has no known allergies.   Family History  Problem Relation Age of Onset  . Healthy Mother   . Anemia Mother   . Hypertension Mother   . Fibroids Mother   . Healthy Father   .  Learning disabilities Daughter   . Cancer Maternal Grandmother   . Thyroid disease Neg Hx   . Diabetes Neg Hx     Social History Social History   Tobacco Use  . Smoking status: Former Research scientist (life sciences)  . Smokeless tobacco: Never Used  Substance Use Topics  . Alcohol use: No  . Drug use: No    Review of Systems  Constitutional: Positive fever ENT:   No sore throat. No rhinorrhea. Cardiovascular: Positive chest pain without  syncope. Respiratory: Positive shortness of breath and cough. Gastrointestinal:   Negative  for abdominal pain, vomiting and diarrhea.  Musculoskeletal:   Negative for focal pain or swelling All other systems reviewed and are negative except as documented above in ROS and HPI.  ____________________________________________   PHYSICAL EXAM:  VITAL SIGNS: ED Triage Vitals  Enc Vitals Group     BP 01/24/20 0944 (!) 143/91     Pulse Rate 01/24/20 0944 (!) 135     Resp 01/24/20 0944 16     Temp 01/24/20 0944 99.1 F (37.3 C)     Temp Source 01/24/20 0944 Oral     SpO2 01/24/20 0944 100 %     Weight 01/24/20 0945 226 lb 13.7 oz (102.9 kg)     Height 01/24/20 0945 5\' 3"  (1.6 m)     Head Circumference --      Peak Flow --      Pain Score 01/24/20 0945 0     Pain Loc --      Pain Edu? --      Excl. in [place]? --     Vital signs reviewed, nursing assessments reviewed.   Constitutional:   Alert and oriented. Non-toxic appearance. Eyes:   Conjunctivae are normal. EOMI. PERRL.  Normal reflex to confrontation ENT      Head:   Normocephalic and atraumatic.      Nose:   Wearing a mask.      Mouth/Throat:   Wearing a mask.      Neck:   No meningismus. Full ROM. Hematological/Lymphatic/Immunilogical:   No cervical lymphadenopathy. Cardiovascular:   Tachycardia heart rate 130. Symmetric bilateral radial and DP pulses.  No murmurs. Cap refill less than 2 seconds. Respiratory:   Normal respiratory effort without tachypnea/retractions. Breath sounds are clear and equal bilaterally. No wheezes/rales/rhonchi. Gastrointestinal:   Soft and nontender. Non distended. There is no CVA tenderness.  No rebound, rigidity, or guarding.  Musculoskeletal:   Normal range of motion in all extremities. No joint effusions.  No lower extremity tenderness.  No edema. Neurologic:   Normal speech and language.  Motor grossly intact. No acute focal neurologic deficits are appreciated.  Skin:    Skin is warm, dry and intact. No rash noted.  No petechiae, purpura, or  bullae.  ____________________________________________    LABS (pertinent positives/negatives) (all labs ordered are listed, but only abnormal results are displayed) Labs Reviewed  CBC WITH DIFFERENTIAL/PLATELET - Abnormal; Notable for the following components:      Result Value   Hemoglobin 11.3 (*)    HCT 35.7 (*)    All other components within normal limits  URINALYSIS, COMPLETE (UACMP) WITH MICROSCOPIC - Abnormal; Notable for the following components:   Color, Urine YELLOW (*)    APPearance CLEAR (*)    Hgb urine dipstick MODERATE (*)    Leukocytes,Ua TRACE (*)    Bacteria, UA FEW (*)    All other components within normal limits  SALICYLATE LEVEL - Abnormal; Notable for the following  components:   Salicylate Lvl <7.0 (*)    All other components within normal limits  FIBRIN DERIVATIVES D-DIMER ([facility] ONLY) - Abnormal; Notable for the following components:   Fibrin derivatives D-dimer ([facility]) 552.25 (*)    All other components within normal limits  BASIC METABOLIC PANEL - Abnormal; Notable for the following components:   Potassium 3.4 (*)    Creatinine, Ser 1.12 (*)    All other components within normal limits  SARS CORONAVIRUS 2 BY RT PCR (HOSPITAL ORDER, PERFORMED IN [facility])  LIPASE, BLOOD  URINE DRUG SCREEN, QUALITATIVE ([facility] ONLY)  ACETAMINOPHEN LEVEL  POC URINE PREG, ED  POCT PREGNANCY, URINE   ____________________________________________   EKG  Interpreted by me Sinus tachycardia rate 127.  Normal axis and intervals.  Normal QRS ST segments and T waves.  No acute ischemic changes.  ____________________________________________    RADIOLOGY  CT Head Wo Contrast  Result Date: 01/24/2020 CLINICAL DATA:  Altered mental status. Possible allergic reaction to vaccination. Fever. Shortness of breath. Vision loss. EXAM: CT HEAD WITHOUT CONTRAST TECHNIQUE: Contiguous axial images were obtained from the base of the skull through the vertex without  intravenous contrast. COMPARISON:  04/13/2019 FINDINGS: Brain: The brain shows a normal appearance without evidence of malformation, atrophy, old or acute small or large vessel infarction, mass lesion, hemorrhage, hydrocephalus or extra-axial collection. Vascular: No hyperdense vessel. No evidence of atherosclerotic calcification. Skull: Normal.  No traumatic finding.  No focal bone lesion. Sinuses/Orbits: Sinuses are clear. Orbits appear normal. Mastoids are clear. Other: None significant IMPRESSION: Normal head CT Electronically Signed   By: Paulina Fusi M.D.   On: 01/24/2020 11:47    ____________________________________________   PROCEDURES Procedures  ____________________________________________  DIFFERENTIAL DIAGNOSIS   Vaccine side effect, dehydration, pulmonary embolism, COVID-19, pneumonia, anxiety  CLINICAL IMPRESSION / ASSESSMENT AND PLAN / ED COURSE  Medications ordered in the ED: Medications  sodium chloride 0.9 % bolus 1,000 mL (1,000 mLs Intravenous New Bag/Given 01/24/20 1053)  ketorolac (TORADOL) tablet 10 mg (10 mg Oral Given 01/24/20 1041)  ondansetron (ZOFRAN) injection 4 mg (4 mg Intravenous Given 01/24/20 1053)  iohexol (OMNIPAQUE) 350 MG/ML injection 75 mL (75 mLs Intravenous Contrast Given 01/24/20 1501)    Pertinent labs & imaging results that were available during my care of the patient were reviewed by me and considered in my medical decision making (see chart for details).  Jill [name] was evaluated in Emergency Department on 01/24/2020 for the symptoms described in the history of present illness. She was evaluated in the context of the global COVID-19 pandemic, which necessitated consideration that the patient might be at risk for infection with the SARS-CoV-2 virus that causes COVID-19. Institutional protocols and algorithms that pertain to the evaluation of patients at risk for COVID-19 are in a state of rapid change based on information released by  regulatory bodies including the CDC and federal and state organizations. These policies and algorithms were followed during the patient's care in the ED.     Clinical Course as of Jan 23 1521  Thu Jan 24, 2020  1107 Patient presents with malaise, fever, chest pain, shortness of breath 2 days after receiving Covid immunization in her left deltoid.  Also reports bilateral vision loss.  Will obtain CT head, labs.  IV fluids for hydration.   [PS]    Clinical Course User Index [PS] Sharman Cheek, MD    ----------------------------------------- 3:22 PM on 01/24/2020 -----------------------------------------  CT head and x-rays unremarkable.  D-dimer  elevated so CT angiogram obtained to rule out PE.  Patient reports that her vision is back to normal and she feels better.  Tachycardia improved.  Most likely vaccine side effects causing influenza-like syndrome.  Plan to follow-up CT scan results, discharge home with supportive care if no severe findings are identified.   ____________________________________________   FINAL CLINICAL IMPRESSION(S) / ED DIAGNOSES    Final diagnoses:  Nonspecific chest pain     ED Discharge Orders         Ordered    ibuprofen (MOTRIN IB) 200 MG tablet  Every 6 hours PRN     01/24/20 1521    ondansetron (ZOFRAN ODT) 4 MG disintegrating tablet  Every 8 hours PRN     01/24/20 1521          Portions of this note were generated with dragon dictation software. Dictation errors may occur despite best attempts at proofreading.   Sharman Cheek, MD 01/24/20 [phone]

## 2020-01-24 NOTE — ED Notes (Signed)
Report received from [place], [place] for continuation of care.  PT resting in high fowlers with mother at bedside.  GCS 15, airway patent, RR even/unlabored, clear bilaterally,  C/o cough leading up to visit, however, today no cough or SOB.  abd soft/round/nontender, skin warm/dry, VSS. Updated on plan of care/verbalized understandiong. Will continue to monitor/reassess.

## 2020-01-29 ENCOUNTER — Encounter: Payer: Self-pay | Admitting: Advanced Practice Midwife

## 2020-01-29 ENCOUNTER — Ambulatory Visit: Payer: Medicaid Other | Admitting: Advanced Practice Midwife

## 2020-01-29 ENCOUNTER — Other Ambulatory Visit: Payer: Self-pay

## 2020-01-29 ENCOUNTER — Ambulatory Visit: Payer: Medicaid Other

## 2020-01-29 DIAGNOSIS — Z8759 Personal history of other complications of pregnancy, childbirth and the puerperium: Secondary | ICD-10-CM

## 2020-01-29 DIAGNOSIS — Z113 Encounter for screening for infections with a predominantly sexual mode of transmission: Secondary | ICD-10-CM | POA: Diagnosis not present

## 2020-01-29 DIAGNOSIS — E66813 Obesity, class 3: Secondary | ICD-10-CM | POA: Insufficient documentation

## 2020-01-29 LAB — WET PREP FOR TRICH, YEAST, CLUE
Trichomonas Exam: NEGATIVE
Yeast Exam: NEGATIVE

## 2020-01-29 NOTE — Progress Notes (Signed)
[facility] Department STI clinic/screening visit  Subjective:  Jill [name] is a 34 y.o. SBF G9P5 nonsmoker female being seen today for an STI screening visit. The patient reports they do not have symptoms.  Patient reports that they do not desire a pregnancy in the next year.   They reported they are not interested in discussing contraception today.  Patient's last menstrual period was 01/22/2020 (exact date).   Patient has the following medical conditions:   Patient Active Problem List   Diagnosis Date Noted  . Morbid obesity ([place]) BMI=36.3 01/29/2020  . Convulsion ([place]) 11/19/2019  . Umbilical hernia without obstruction and without gangrene   . Ruptured ectopic pregnancy with bilateral salpingectomy 04/24/18 & 10/07/18 10/07/2018  . History of ectopic pregnancy with bilateral salpingectomy 04/24/18 & 10/07/18 09/23/2018  . Seizure (HCC)--Keppra 09/14/2018  . History of absence seizures 04/25/2018  . Chronic kidney disease   . Episode of loss of consciousness   . Anemia during pregnancy 07/14/2016  . Trichomoniasis 05/19/2016  . Hx of migraines 04/05/2016  . History of cesarean delivery x5 04/05/2016    Chief Complaint  Patient presents with  . Exposure to STD    HPI  Patient reports LMP 01/22/20.  Last sex today without condom.  Last use MJ 12/2019.  Both fallopian tubes removed  See flowsheet for further details and programmatic requirements.    The following portions of the patient's history were reviewed and updated as appropriate: allergies, current medications, past medical history, past social history, past surgical history and problem list.  Objective:  There were no vitals filed for this visit.  Physical Exam Vitals and nursing note reviewed.  Constitutional:      Appearance: Normal appearance. She is obese.  HENT:     Head: Normocephalic and atraumatic.     Comments: Pharyngeal culture done--pt coughed in providers face while culture  obtained    Mouth/Throat:     Mouth: Mucous membranes are moist.     Pharynx: Oropharynx is clear. No oropharyngeal exudate or posterior oropharyngeal erythema.  Eyes:     Conjunctiva/sclera: Conjunctivae normal.  Cardiovascular:     Rate and Rhythm: Normal rate and regular rhythm.  Pulmonary:     Effort: Pulmonary effort is normal.  Abdominal:     Palpations: Abdomen is soft. There is no mass.     Tenderness: There is no abdominal tenderness. There is no rebound.     Comments: Poor tone with increased adipose, soft without tenderness  Genitourinary:    General: Normal vulva.     Exam position: Lithotomy position.     Pubic Area: No rash or pubic lice.      Labia:        Right: No rash or lesion.        Left: No rash or lesion.      Vagina: Bleeding (small amt red menses blood with malodor, ph>4.5) present. No vaginal discharge, erythema or lesions.     Cervix: Normal.     Uterus: Normal.      Rectum: Normal.  Musculoskeletal:     Cervical back: Neck supple.  Lymphadenopathy:     Head:     Right side of head: No preauricular or posterior auricular adenopathy.     Left side of head: No preauricular or posterior auricular adenopathy.     Cervical: No cervical adenopathy.     Upper Body:     Right upper body: No supraclavicular or axillary adenopathy.  Left upper body: No supraclavicular or axillary adenopathy.     Lower Body: No right inguinal adenopathy. No left inguinal adenopathy.  Skin:    General: Skin is warm and dry.     Findings: No rash.  Neurological:     Mental Status: She is alert and oriented to person, place, and time.      Assessment and Plan:  Jill [name] is a 34 y.o. female presenting to the [facility] Department for STI screening  1. Screening examination for venereal disease Treat wet mount per standing orders Immunization nurse consult Pt can only have IS appts here due to our protocal (pt had bilateral salpingectomy) - WET  PREP FOR TRICH, YEAST, CLUE - Gonococcus culture - HIV/HCV [facility] [facility] - Syphilis Serology, [facility] - Chlamydia/Gonorrhea [facility] - HBV Antigen/Antibody [facility]  2. Morbid obesity (HCC) BMI=36.3      No follow-ups on file.  No future appointments.  Alberteen Spindle, CNM

## 2020-01-29 NOTE — Progress Notes (Signed)
Patient here today for PE, BCM and STD screening. Also wants PT and Pap test. Last PE and Pap test 08/14/2018. Per [facility], next Pap with cotest and CBE due 2022. Patient has had both fallopian tubes removed, one in 04/2018, and other in 09/2018 due to 2 separate ectopic pregnancies. Chart and surgical history reviewed with provider, and patient will be changed to STD visit today. PCP list given and patient counseled she will need to have PE and Pap's at her PCP, and may come to [facility] for STD visits.Burt Knack, RN

## 2020-01-29 NOTE — Progress Notes (Signed)
Wet mount reviewed, no treatment indicated..Katie Brewer-Jensen, RN 

## 2020-01-29 NOTE — Progress Notes (Signed)
Patient appointment rescheduled for STD screening today. See other chart dated today. Patient counseled that she will need PE and Pap at her PCP in the future and may come to [facility] for STD testing. Patient states understanding. STD consent signed.Burt Knack, RN

## 2020-02-02 LAB — GONOCOCCUS CULTURE

## 2020-02-06 LAB — HEPATITIS B SURFACE ANTIGEN

## 2020-02-06 LAB — HM HEPATITIS C SCREENING LAB: HM Hepatitis Screen: NEGATIVE

## 2020-02-06 LAB — HM HIV SCREENING LAB: HM HIV Screening: NEGATIVE

## 2020-04-01 ENCOUNTER — Other Ambulatory Visit: Payer: Self-pay

## 2020-04-01 ENCOUNTER — Emergency Department (HOSPITAL_COMMUNITY)
Admission: EM | Admit: 2020-04-01 | Discharge: 2020-04-01 | Discharge status: Against Medical Advice | Payer: Medicaid Other

## 2020-04-01 NOTE — ED Notes (Signed)
No answer for triage.

## 2020-10-24 ENCOUNTER — Other Ambulatory Visit: Payer: Self-pay

## 2020-10-24 ENCOUNTER — Encounter (HOSPITAL_COMMUNITY): Payer: Self-pay | Admitting: Emergency Medicine

## 2020-10-24 ENCOUNTER — Emergency Department (HOSPITAL_COMMUNITY)
Admission: EM | Admit: 2020-10-24 | Discharge: 2020-10-25 | Disposition: A | Discharge status: Home / Self Care | Payer: Medicaid Other | Attending: Emergency Medicine | Admitting: Emergency Medicine

## 2020-10-24 DIAGNOSIS — F445 Conversion disorder with seizures or convulsions: Secondary | ICD-10-CM

## 2020-10-24 DIAGNOSIS — R569 Unspecified convulsions: Secondary | ICD-10-CM

## 2020-10-24 DIAGNOSIS — N189 Chronic kidney disease, unspecified: Secondary | ICD-10-CM | POA: Diagnosis not present

## 2020-10-24 DIAGNOSIS — G4089 Other seizures: Secondary | ICD-10-CM | POA: Insufficient documentation

## 2020-10-24 DIAGNOSIS — R000 Tachycardia, unspecified: Secondary | ICD-10-CM | POA: Insufficient documentation

## 2020-10-24 MED ORDER — ZONISAMIDE 100 MG PO CAPS
200.0000 mg | ORAL_CAPSULE | Freq: Every day | ORAL | Status: DC
  Administered 2020-10-24: 200 mg via ORAL
  Filled 2020-10-24 (×2): qty 2

## 2020-10-24 MED ORDER — LEVETIRACETAM IN NACL 500 MG/100ML IV SOLN
500.0000 mg | Freq: Once | INTRAVENOUS | Status: AC
  Administered 2020-10-24: 500 mg via INTRAVENOUS
  Filled 2020-10-24: qty 100

## 2020-10-24 MED ORDER — LORAZEPAM 2 MG/ML IJ SOLN
1.0000 mg | Freq: Once | INTRAMUSCULAR | Status: AC
  Administered 2020-10-24: 1 mg via INTRAVENOUS
  Filled 2020-10-24: qty 1

## 2020-10-24 MED ORDER — AMMONIA AROMATIC IN INHA
1.0000 | Freq: Once | RESPIRATORY_TRACT | Status: DC
  Filled 2020-10-24: qty 10

## 2020-10-24 MED ORDER — SODIUM CHLORIDE 0.9 % IV BOLUS
1000.0000 mL | Freq: Once | INTRAVENOUS | Status: AC
  Administered 2020-10-24: 1000 mL via INTRAVENOUS

## 2020-10-24 NOTE — ED Notes (Signed)
Pseudoseizure activity noted by family -hands shaking , EDP notified . Respirations unlabored.

## 2020-10-24 NOTE — ED Triage Notes (Signed)
Patient arrived with EMS reports seizure episode this evening with arms/legs shaking and blank stares , she has not taken her Keppra today , CBG=108. Alert but still slow to respond , history of pseudoseizures.

## 2020-10-24 NOTE — ED Provider Notes (Signed)
[facility] EMERGENCY DEPARTMENT Provider Note   CSN: [id number] Arrival date & time: 10/24/20  2108     History Chief Complaint  Patient presents with  . Seizures    Jill [name] is a 35 y.o. female.  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 She had been taking Keppra 500 mg twice daily and unfortunately could not go up because of the sedation side effects. The mother who is the primary historian reports that she has seizures approximately twice per week and this is not unusual for her. They were on the way home from a restaurant this evening when she started to have shaking of her arms and legs in the car. She reports that they would come and go throughout the ride, there were short interludes of peace intermixed with periods of jerking. Then there were areas of staring off, there is no incontinence, no tongue biting and she did not seem to be getting back to her normal self like she usually does. She reports that this has happened before but not that often. She does endorse missing her Keppra today both morning and evening doses. The patient is coming around and now able to nod yes or no but still not talking. Level 5 caveat applies. There was no complaints of any symptoms prior to this happening according to the mother including shortness of breath fevers chills coughing nausea vomiting or diarrhea chest pain abdominal pain back pain or rashes  Past Medical History:  Diagnosis Date  . Anemia   . Headache    migrains  . Seizures [facility])     Patient Active Problem List   Diagnosis Date Noted  . Morbid obesity (HCC) BMI=36.3 01/29/2020  . Convulsion (HCC) 11/19/2019  . Umbilical hernia without obstruction and without gangrene   . Ruptured  ectopic pregnancy with bilateral salpingectomy 04/24/18 & 10/07/18 10/07/2018  . History of ectopic pregnancy with bilateral salpingectomy 04/24/18 & 10/07/18 09/23/2018  . Seizure (HCC)--Keppra 09/14/2018  . History of absence seizures 04/25/2018  . Chronic kidney disease   . Episode of loss of consciousness   . Anemia during pregnancy 07/14/2016  . Trichomoniasis 05/19/2016  . Hx of migraines 04/05/2016  . History of cesarean delivery x5 04/05/2016    Past Surgical History:  Procedure Laterality Date  . CESAREAN SECTION     x4  . CESAREAN SECTION N/A 10/07/2016   Procedure: CESAREAN SECTION;  Surgeon: Suzy Bouchard, MD;  Location: [facility] ORS;  Service: Obstetrics;  Laterality: N/A;  . DIAGNOSTIC LAPAROSCOPY WITH REMOVAL OF ECTOPIC PREGNANCY Left 10/07/2018   Procedure: laparoscopic left salpingectomy;  Surgeon: Hildred Laser, MD;  Location: [facility] ORS;  Service: Gynecology;  Laterality: Left;  . HERNIA REPAIR     umbilical x 2  . UMBILICAL HERNIA REPAIR N/A 10/02/2019   Procedure: HERNIA REPAIR UMBILICAL ADULT;  Surgeon: Henrene Dodge, MD;  Location: [facility] ORS;  Service: General;  Laterality: N/A;     OB History    Gravida  7   Para  4   Term  4   Preterm      AB  2   Living  4     SAB      IAB      Ectopic  1  Multiple      Live Births  4        Obstetric Comments  G6 - ruptured ectopic s/p left salpingectomy  Menstrual age: 25 Age 1st Pregnancy: 77         Family History  Problem Relation Age of Onset  . Healthy Mother   . Anemia Mother   . Hypertension Mother   . Fibroids Mother   . Healthy Father   . Learning disabilities Daughter   . Cancer Maternal Grandmother   . Thyroid disease Neg Hx   . Diabetes Neg Hx     Social History   Tobacco Use  . Smoking status: Never Smoker  . Smokeless tobacco: Never Used  Vaping Use  . Vaping Use: Never used  Substance Use Topics  . Alcohol use: No  . Drug use: No    Home Medications Prior to  Admission medications   Medication Sig Start Date End Date Taking? Authorizing Provider  benzonatate (TESSALON PERLES) 100 MG capsule Take 1 capsule (100 mg total) by mouth every 6 (six) hours as needed for cough. 01/24/20 01/23/21  Dionne Bucy, MD  ibuprofen (MOTRIN IB) 200 MG tablet Take 3 tablets (600 mg total) by mouth every 6 (six) hours as needed. 01/24/20   Sharman Cheek, MD  levETIRAcetam (KEPPRA) 500 MG tablet Take 2 tablets (1,000 mg total) by mouth 2 (two) times daily. 10/10/18 09/26/20  Houston Siren, MD  ondansetron (ZOFRAN ODT) 4 MG disintegrating tablet Take 1 tablet (4 mg total) by mouth every 8 (eight) hours as needed for nausea or vomiting. 01/24/20   Sharman Cheek, MD  Vitamin D, Ergocalciferol, (DRISDOL) 1.25 MG (50000 UNIT) CAPS capsule Take 50,000 Units by mouth once a week. 01/15/20   [provider]  zonisamide (ZONEGRAN) 100 MG capsule Take 1 capsule (100 mg total) by mouth daily. 11/23/19 01/24/20  Charlsie Quest, MD    Allergies    Patient has no known allergies.  Review of Systems   Review of Systems  All other systems reviewed and are negative.   Physical Exam Updated Vital Signs BP 116/73   Pulse (!) 101   Temp 99.1 F (37.3 C) (Oral)   Resp 20   SpO2 100%   Physical Exam Vitals and nursing note reviewed.  Constitutional:      General: She is not in acute distress.    Appearance: She is well-developed and well-nourished.  HENT:     Head: Normocephalic and atraumatic.     Mouth/Throat:     Mouth: Oropharynx is clear and moist.     Pharynx: No oropharyngeal exudate.     Comments: There is no trauma to the tongue, no damage to the dentition, no bleeding in the oropharynx Eyes:     General: No scleral icterus.       Right eye: No discharge.        Left eye: No discharge.     Extraocular Movements: EOM normal.     Conjunctiva/sclera: Conjunctivae normal.     Pupils: Pupils are equal, round, and reactive to light.  Neck:      Thyroid: No thyromegaly.     Vascular: No JVD.  Cardiovascular:     Rate and Rhythm: Regular rhythm. Tachycardia present.     Pulses: Intact distal pulses.     Heart sounds: Normal heart sounds. No murmur heard. No friction rub. No gallop.      Comments: Tachycardic to 110 with strong peripheral radial artery pulses  Pulmonary:     Effort: Pulmonary effort is normal. No respiratory distress.     Breath sounds: Normal breath sounds. No wheezing or rales.  Abdominal:     General: Bowel sounds are normal. There is no distension.     Palpations: Abdomen is soft. There is no mass.     Tenderness: There is no abdominal tenderness.  Musculoskeletal:        General: No tenderness or edema. Normal range of motion.     Cervical back: Normal range of motion and neck supple.  Lymphadenopathy:     Cervical: No cervical adenopathy.  Skin:    General: Skin is warm and dry.     Findings: No erythema or rash.  Neurological:     Mental Status: She is alert.     Coordination: Coordination normal.     Comments: There is no facial droop, she is able to open and close her mouth, she has a symmetrical face, sensation is intact diffusely, she is able to squeeze both my hands but appears generally diffusely weak. She will nod yes or no to questions  Psychiatric:        Mood and Affect: Mood and affect normal.        Behavior: Behavior normal.     ED Results / Procedures / Treatments   Labs (all labs ordered are listed, but only abnormal results are displayed) Labs Reviewed - No data to display  EKG EKG Interpretation  Date/Time:  Friday October 24 2020 21:19:24 EST Ventricular Rate:  118 PR Interval:    QRS Duration: 77 QT Interval:  312 QTC Calculation: 438 R Axis:   65 Text Interpretation: Sinus tachycardia Borderline T wave abnormalities since last tracing no significant change Confirmed by Eber Hong ([id number]) on 10/24/2020 9:46:27 PM   Radiology No results  found.  Procedures Procedures   Medications Ordered in ED Medications  zonisamide (ZONEGRAN) capsule 200 mg (200 mg Oral Given 10/24/20 2203)  ammonia inhalant 1 each (has no administration in time range)  LORazepam (ATIVAN) injection 1 mg (has no administration in time range)  levETIRAcetam (KEPPRA) IVPB 500 mg/100 mL premix (0 mg Intravenous Stopped 10/24/20 2228)  sodium chloride 0.9 % bolus 1,000 mL (1,000 mLs Intravenous New Bag/Given 10/24/20 2211)    ED Course  I have reviewed the triage vital signs and the nursing notes.  Pertinent labs & imaging results that were available during my care of the patient were reviewed by me and considered in my medical decision making (see chart for details).    MDM Rules/Calculators/A&P                          The patient does appear slightly tearful, I suspect that this is a psychiatric nonepileptic seizure that she had this evening, she is not had any infectious symptoms, she did miss her medications today suggesting that this may be a combination of either the prior or possibly a breakthrough epileptic seizure. She does not appear particularly postictal but her lack of speech suggest the need to load with Keppra and observe for short period in the ER. The patient and mother are in agreement  The patient has had another seizure-like activity which I was able to witness.  There was some discoordinated shaking of the arms on the right and the leg on the right, not on the left, within a couple of minutes the patient was back to her normal self.  The  mother is very upset and frustrated and scared for child however the child does appear to have pretty straightforward psychiatric nonepileptic seizures.  Vital signs remained stable, some Ativan will be given to help with possible underlying anxiety, fluids are being given, Keppra is being given.  At change of shift - care signed out to DR. Delo.  Final Clinical Impression(s) / ED Diagnoses Final  diagnoses:  Pseudoseizure    Rx / DC Orders ED Discharge Orders    None       Eber Hong, MD 10/24/20 2302

## 2020-10-25 ENCOUNTER — Emergency Department (HOSPITAL_COMMUNITY): Payer: Medicaid Other

## 2020-10-25 LAB — CBC WITH DIFFERENTIAL/PLATELET
Abs Immature Granulocytes: 0.01 10*3/uL (ref 0.00–0.07)
Basophils Absolute: 0.1 10*3/uL (ref 0.0–0.1)
Basophils Relative: 1 %
Eosinophils Absolute: 0.1 10*3/uL (ref 0.0–0.5)
Eosinophils Relative: 1 %
HCT: 36.1 % (ref 36.0–46.0)
Hemoglobin: 11.3 g/dL — ABNORMAL LOW (ref 12.0–15.0)
Immature Granulocytes: 0 %
Lymphocytes Relative: 43 %
Lymphs Abs: 3.5 10*3/uL (ref 0.7–4.0)
MCH: 25 pg — ABNORMAL LOW (ref 26.0–34.0)
MCHC: 31.3 g/dL (ref 30.0–36.0)
MCV: 79.9 fL — ABNORMAL LOW (ref 80.0–100.0)
Monocytes Absolute: 0.6 10*3/uL (ref 0.1–1.0)
Monocytes Relative: 8 %
Neutro Abs: 4 10*3/uL (ref 1.7–7.7)
Neutrophils Relative %: 47 %
Platelets: 314 10*3/uL (ref 150–400)
RBC: 4.52 MIL/uL (ref 3.87–5.11)
RDW: 18.4 % — ABNORMAL HIGH (ref 11.5–15.5)
WBC: 8.3 10*3/uL (ref 4.0–10.5)
nRBC: 0 % (ref 0.0–0.2)

## 2020-10-25 LAB — ETHANOL: Alcohol, Ethyl (B): <10 mg/dL (ref <10)

## 2020-10-25 LAB — COMPREHENSIVE METABOLIC PANEL
ALT: 19 U/L (ref 0–44)
AST: 19 U/L (ref 15–41)
Albumin: 3.6 g/dL (ref 3.5–5.0)
Alkaline Phosphatase: 54 U/L (ref 38–126)
Anion gap: 9 (ref 5–15)
BUN: 8 mg/dL (ref 6–20)
CO2: 25 mmol/L (ref 22–32)
Calcium: 9.2 mg/dL (ref 8.9–10.3)
Chloride: 107 mmol/L (ref 98–111)
Creatinine, Ser: 1.08 mg/dL — ABNORMAL HIGH (ref 0.44–1.00)
GFR, Estimated: >60 mL/min (ref >60)
Glucose, Bld: 87 mg/dL (ref 70–99)
Potassium: 4.6 mmol/L (ref 3.5–5.1)
Sodium: 141 mmol/L (ref 135–145)
Total Bilirubin: 0.3 mg/dL (ref 0.3–1.2)
Total Protein: 6.4 g/dL — ABNORMAL LOW (ref 6.5–8.1)

## 2020-10-25 LAB — TROPONIN I (HIGH SENSITIVITY): Troponin I (High Sensitivity): 2 ng/L (ref <18)

## 2020-10-25 LAB — I-STAT BETA HCG BLOOD, ED (MC, WL, AP ONLY): I-stat hCG, quantitative: <5 m[IU]/mL (ref <5)

## 2020-10-25 MED ORDER — ACETAMINOPHEN 500 MG PO TABS
1000.0000 mg | ORAL_TABLET | Freq: Once | ORAL | Status: AC
  Administered 2020-10-25: 1000 mg via ORAL
  Filled 2020-10-25: qty 2

## 2020-10-25 MED ORDER — SODIUM CHLORIDE 0.9 % IV BOLUS
1000.0000 mL | Freq: Once | INTRAVENOUS | Status: AC
  Administered 2020-10-25: 1000 mL via INTRAVENOUS

## 2020-10-25 NOTE — Discharge Instructions (Addendum)
Continue medications as previously prescribed.  Follow-up with your neurologist/primary doctor in the next week, and return to the ER if symptoms significantly worsen or change.

## 2020-10-25 NOTE — ED Provider Notes (Signed)
Care assumed from Dr. Hyacinth Meeker at shift change.  Patient presenting here with seizure-like activity.  She apparently skipped a dose of Keppra this morning.  Patient awaiting reassessment and possible discharge after receiving her Keppra.  Laboratory studies were obtained including CBC, basic, EtOH, as well as head CT.  All of the studies were unremarkable.  Patient has been resting comfortably for the past several hours and I believe is appropriate for discharge.   Geoffery Lyons, MD 10/25/20 0430

## 2021-01-22 ENCOUNTER — Other Ambulatory Visit: Payer: Self-pay | Admitting: Family Medicine

## 2021-01-22 ENCOUNTER — Ambulatory Visit: Payer: Medicare Other

## 2021-01-22 DIAGNOSIS — R600 Localized edema: Secondary | ICD-10-CM

## 2021-01-23 ENCOUNTER — Other Ambulatory Visit: Payer: Self-pay

## 2021-01-23 ENCOUNTER — Ambulatory Visit
Admission: RE | Admit: 2021-01-23 | Discharge: 2021-01-23 | Disposition: A | Discharge status: Home / Self Care | Payer: Medicare Other | Source: Ambulatory Visit | Attending: Family Medicine | Admitting: Family Medicine

## 2021-01-23 DIAGNOSIS — R600 Localized edema: Secondary | ICD-10-CM | POA: Insufficient documentation

## 2021-03-07 ENCOUNTER — Emergency Department: Payer: Medicare Other

## 2021-03-07 ENCOUNTER — Other Ambulatory Visit: Payer: Self-pay

## 2021-03-07 ENCOUNTER — Inpatient Hospital Stay
Admission: EM | Admit: 2021-03-07 | Discharge: 2021-03-13 | DRG: 101 | Disposition: A | Discharge status: Home Health | Payer: Medicare Other | Attending: Internal Medicine | Admitting: Internal Medicine

## 2021-03-07 DIAGNOSIS — Z8249 Family history of ischemic heart disease and other diseases of the circulatory system: Secondary | ICD-10-CM

## 2021-03-07 DIAGNOSIS — Z6841 Body Mass Index (BMI) 40.0 and over, adult: Secondary | ICD-10-CM

## 2021-03-07 DIAGNOSIS — E669 Obesity, unspecified: Secondary | ICD-10-CM | POA: Diagnosis not present

## 2021-03-07 DIAGNOSIS — Z87448 Personal history of other diseases of urinary system: Secondary | ICD-10-CM | POA: Diagnosis not present

## 2021-03-07 DIAGNOSIS — G9349 Other encephalopathy: Secondary | ICD-10-CM | POA: Diagnosis present

## 2021-03-07 DIAGNOSIS — Z79899 Other long term (current) drug therapy: Secondary | ICD-10-CM

## 2021-03-07 DIAGNOSIS — R569 Unspecified convulsions: Secondary | ICD-10-CM

## 2021-03-07 DIAGNOSIS — R531 Weakness: Secondary | ICD-10-CM

## 2021-03-07 DIAGNOSIS — G40409 Other generalized epilepsy and epileptic syndromes, not intractable, without status epilepticus: Principal | ICD-10-CM | POA: Diagnosis present

## 2021-03-07 DIAGNOSIS — R339 Retention of urine, unspecified: Secondary | ICD-10-CM | POA: Diagnosis not present

## 2021-03-07 DIAGNOSIS — G8194 Hemiplegia, unspecified affecting left nondominant side: Secondary | ICD-10-CM | POA: Diagnosis present

## 2021-03-07 DIAGNOSIS — E162 Hypoglycemia, unspecified: Secondary | ICD-10-CM | POA: Diagnosis present

## 2021-03-07 DIAGNOSIS — Z20822 Contact with and (suspected) exposure to covid-19: Secondary | ICD-10-CM | POA: Diagnosis present

## 2021-03-07 DIAGNOSIS — D649 Anemia, unspecified: Secondary | ICD-10-CM | POA: Diagnosis present

## 2021-03-07 DIAGNOSIS — D509 Iron deficiency anemia, unspecified: Secondary | ICD-10-CM | POA: Diagnosis present

## 2021-03-07 DIAGNOSIS — R338 Other retention of urine: Secondary | ICD-10-CM | POA: Diagnosis not present

## 2021-03-07 DIAGNOSIS — Z72820 Sleep deprivation: Secondary | ICD-10-CM

## 2021-03-07 DIAGNOSIS — R519 Headache, unspecified: Secondary | ICD-10-CM | POA: Diagnosis present

## 2021-03-07 DIAGNOSIS — E236 Other disorders of pituitary gland: Secondary | ICD-10-CM | POA: Diagnosis present

## 2021-03-07 DIAGNOSIS — G9340 Encephalopathy, unspecified: Secondary | ICD-10-CM | POA: Diagnosis present

## 2021-03-07 LAB — CBC
HCT: 35.9 % — ABNORMAL LOW (ref 36.0–46.0)
Hemoglobin: 11.2 g/dL — ABNORMAL LOW (ref 12.0–15.0)
MCH: 24.6 pg — ABNORMAL LOW (ref 26.0–34.0)
MCHC: 31.2 g/dL (ref 30.0–36.0)
MCV: 78.9 fL — ABNORMAL LOW (ref 80.0–100.0)
Platelets: 338 10*3/uL (ref 150–400)
RBC: 4.55 MIL/uL (ref 3.87–5.11)
RDW: 14.4 % (ref 11.5–15.5)
WBC: 5.4 10*3/uL (ref 4.0–10.5)
nRBC: 0 % (ref 0.0–0.2)

## 2021-03-07 LAB — URINE DRUG SCREEN, QUALITATIVE (ARMC ONLY)
Amphetamines, Ur Screen: NOT DETECTED
Barbiturates, Ur Screen: NOT DETECTED
Benzodiazepine, Ur Scrn: NOT DETECTED
Cannabinoid 50 Ng, Ur ~~LOC~~: NOT DETECTED
Cocaine Metabolite,Ur ~~LOC~~: NOT DETECTED
MDMA (Ecstasy)Ur Screen: NOT DETECTED
Methadone Scn, Ur: NOT DETECTED
Opiate, Ur Screen: NOT DETECTED
Phencyclidine (PCP) Ur S: NOT DETECTED
Tricyclic, Ur Screen: POSITIVE — AB

## 2021-03-07 LAB — CBG MONITORING, ED
Glucose-Capillary: 67 mg/dL — ABNORMAL LOW (ref 70–99)
Glucose-Capillary: 64 mg/dL — ABNORMAL LOW (ref 70–99)
Glucose-Capillary: 67 mg/dL — ABNORMAL LOW (ref 70–99)

## 2021-03-07 LAB — URINALYSIS, COMPLETE (UACMP) WITH MICROSCOPIC
Bilirubin Urine: NEGATIVE
Glucose, UA: NEGATIVE mg/dL
Ketones, ur: NEGATIVE mg/dL
Leukocytes,Ua: NEGATIVE
Nitrite: NEGATIVE
Protein, ur: NEGATIVE mg/dL
Specific Gravity, Urine: 1.008 (ref 1.005–1.030)
pH: 8 (ref 5.0–8.0)

## 2021-03-07 LAB — TSH: TSH: 1.532 u[IU]/mL (ref 0.350–4.500)

## 2021-03-07 LAB — MAGNESIUM: Magnesium: 2.3 mg/dL (ref 1.7–2.4)

## 2021-03-07 LAB — BASIC METABOLIC PANEL
Anion gap: 5 (ref 5–15)
BUN: 6 mg/dL (ref 6–20)
CO2: 29 mmol/L (ref 22–32)
Calcium: 9 mg/dL (ref 8.9–10.3)
Chloride: 104 mmol/L (ref 98–111)
Creatinine, Ser: 1.1 mg/dL — ABNORMAL HIGH (ref 0.44–1.00)
GFR, Estimated: >60 mL/min (ref >60)
Glucose, Bld: 91 mg/dL (ref 70–99)
Potassium: 3.8 mmol/L (ref 3.5–5.1)
Sodium: 138 mmol/L (ref 135–145)

## 2021-03-07 LAB — RESP PANEL BY RT-PCR (FLU A&B, COVID) ARPGX2
Influenza A by PCR: NEGATIVE
Influenza B by PCR: NEGATIVE
SARS Coronavirus 2 by RT PCR: NEGATIVE

## 2021-03-07 LAB — GLUCOSE, CAPILLARY: Glucose-Capillary: 148 mg/dL — ABNORMAL HIGH (ref 70–99)

## 2021-03-07 MED ORDER — LORAZEPAM 2 MG/ML IJ SOLN
2.0000 mg | INTRAMUSCULAR | Status: DC | PRN
  Administered 2021-03-11: 12:00:00 2 mg via INTRAVENOUS
  Filled 2021-03-07: qty 1

## 2021-03-07 MED ORDER — ACETAMINOPHEN 325 MG PO TABS
650.0000 mg | ORAL_TABLET | Freq: Four times a day (QID) | ORAL | Status: DC | PRN
  Administered 2021-03-07 – 2021-03-12 (×11): 650 mg via ORAL
  Filled 2021-03-07 (×10): qty 2

## 2021-03-07 MED ORDER — LEVETIRACETAM IN NACL 500 MG/100ML IV SOLN
500.0000 mg | Freq: Once | INTRAVENOUS | Status: AC
  Administered 2021-03-07: 500 mg via INTRAVENOUS
  Filled 2021-03-07: qty 100

## 2021-03-07 MED ORDER — DEXTROSE 10 % IV SOLN: INTRAVENOUS | Status: DC

## 2021-03-07 MED ORDER — LEVETIRACETAM IN NACL 1500 MG/100ML IV SOLN: 1500.0000 mg | Freq: Two times a day (BID) | INTRAVENOUS | Status: DC

## 2021-03-07 MED ORDER — ACETAMINOPHEN 650 MG RE SUPP: 650.0000 mg | Freq: Four times a day (QID) | RECTAL | Status: DC | PRN

## 2021-03-07 MED ORDER — LEVETIRACETAM IN NACL 1000 MG/100ML IV SOLN
1000.0000 mg | Freq: Two times a day (BID) | INTRAVENOUS | Status: AC
  Administered 2021-03-08 (×2): 1000 mg via INTRAVENOUS
  Filled 2021-03-07 (×3): qty 100

## 2021-03-07 MED ORDER — LEVETIRACETAM IN NACL 1500 MG/100ML IV SOLN
1500.0000 mg | Freq: Once | INTRAVENOUS | Status: AC
  Administered 2021-03-07: 1500 mg via INTRAVENOUS
  Filled 2021-03-07: qty 100

## 2021-03-07 MED ORDER — DEXTROSE 50 % IV SOLN
1.0000 | INTRAVENOUS | Status: DC | PRN
  Administered 2021-03-07: 22:00:00 50 mL via INTRAVENOUS
  Filled 2021-03-07: qty 50

## 2021-03-07 NOTE — ED Notes (Signed)
Jae Dire RN attempted IV and blood draw. Called lab to draw blood.

## 2021-03-07 NOTE — ED Notes (Signed)
Patient's BG is 64, MD made aware Patient provided juice at this time and asked to take small sips Will inform MD if juice does not bring BG up PRN order for D50 remains in place if needed Mother of patient aware of plan

## 2021-03-07 NOTE — ED Notes (Signed)
Pt more awake, able to inform us she has a headache. Eyes are much more open. Sat up by request. Mother remains at bedside.

## 2021-03-07 NOTE — ED Provider Notes (Signed)
Emergency Medicine Provider Triage Evaluation Note  Jill [name] , a 35 y.o. female  was evaluated in triage after arrival from EMS. She is in post-ictal state. EMS reports family noted seizure. Unsure if she hit her head. She is now answering questions by shaking her head, or holding up numbers with fingers. She endorses taking all medications, has hx of seizures. She reports the recently increased her Keppra dose. She endorses headache but denies any other pain.  Review of Systems  Positive: Headache, seizure Negative: Recent fever, ill contacts  Physical Exam  BP 101/73 (BP Location: Right Arm)   Pulse (!) 104   Temp 98.7 F (37.1 C) (Oral)   Resp 16   SpO2 99%  Gen:   Awake, post ictal, answering questions with nodding and holding fingers Resp:  Normal effort    Medical Decision Making  Medically screening exam initiated at 11:44 AM.  Appropriate orders placed.  Earney Hamburg was informed that the remainder of the evaluation will be completed by another provider, this initial triage assessment does not replace that evaluation, and the importance of remaining in the ED until their evaluation is complete.    Lucy Chris, PA 03/07/21 1146    Gilles Chiquito, MD 03/07/21 [phone]

## 2021-03-07 NOTE — ED Notes (Signed)
Lab at bedside

## 2021-03-07 NOTE — ED Provider Notes (Signed)
[facility] Emergency Department Provider Note  ____________________________________________  Time seen: Approximately 12:33 PM  I have reviewed the triage vital signs and the nursing notes.   HISTORY  Chief Complaint Seizures  Level 5 Caveat: Patient post-ictal and largely non-responsive  HPI Jill [name] is a 35 y.o. female who presents to the emergency department via EMS for complaint of seizure activity.  Patient is postictal at this time and largely nonresponsive.  There are some nodding yes and no to answers.  Patient with no pain complaints at this time.  Unable to determine events leading up to seizure activity.  I have reviewed the patient's medical record including her most recent neurology visit from 02/26/2021.  It appears that patient is on gabapentin, lamotrigine, Keppra and zonisamide for her seizure activity.  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  Unsure whether there were any new changes leading up to the seizure activity.  According to EMS patient had seizure-like activity for 5 to 10 minutes.  Again she is postictal at this time and unable to answer most questions.  Medical history of anemia, seizures, chronic kidney disease.       Past Medical History:  Diagnosis Date   Anemia    Headache    migrains   Seizures (HCC)     Patient Active Problem List   Diagnosis Date Noted   Seizures (HCC) 03/07/2021   Morbid obesity (HCC) BMI=36.3 01/29/2020   Convulsion (HCC) 11/19/2019   Umbilical hernia without obstruction and without gangrene    Ruptured ectopic pregnancy with bilateral salpingectomy 04/24/18 & 10/07/18 10/07/2018   History of ectopic pregnancy with bilateral salpingectomy 04/24/18 & 10/07/18 09/23/2018   Seizure (HCC)--Keppra 09/14/2018   History of absence seizures 04/25/2018   Chronic kidney disease    Episode of loss of consciousness    Anemia during pregnancy 07/14/2016    Trichomoniasis 05/19/2016   Hx of migraines 04/05/2016   History of cesarean delivery x5 04/05/2016    Past Surgical History:  Procedure Laterality Date   CESAREAN SECTION     x4   CESAREAN SECTION N/A 10/07/2016   Procedure: CESAREAN SECTION;  Surgeon: Suzy Bouchardhomas J Schermerhorn, MD;  Location: [facility] ORS;  Service: Obstetrics;  Laterality: N/A;   DIAGNOSTIC LAPAROSCOPY WITH REMOVAL OF ECTOPIC PREGNANCY Left 10/07/2018   Procedure: laparoscopic left salpingectomy;  Surgeon: Hildred Laserherry, Anika, MD;  Location: [facility] ORS;  Service: Gynecology;  Laterality: Left;   HERNIA REPAIR     umbilical x 2   UMBILICAL HERNIA REPAIR N/A 10/02/2019   Procedure: HERNIA REPAIR UMBILICAL ADULT;  Surgeon: Henrene DodgePiscoya, Jose, MD;  Location: [facility] ORS;  Service: General;  Laterality: N/A;    Prior to Admission medications   Medication Sig Start Date End Date Taking? Authorizing Provider  furosemide (LASIX) 20 MG tablet Take 20 mg by mouth daily. 02/11/21  Yes [provider]  gabapentin (NEURONTIN) 100 MG capsule Take 100 mg by mouth in the morning, at noon, and at bedtime. 03/04/20 01/15/22 Yes [provider]  lamoTRIgine (LAMICTAL) 100 MG tablet Take 100 mg by mouth 2 (two) times daily. 03/03/21 04/02/21 Yes [provider]  lamoTRIgine (LAMICTAL) 25 MG tablet Take 25 mg by mouth 2 (two) times daily. 02/20/21 05/21/21 Yes [provider]  levETIRAcetam (KEPPRA) 500 MG tablet Take 500 mg by mouth in the morning and at bedtime. 10/10/18 03/11/21 Yes [provider]  ondansetron (ZOFRAN ODT) 4 MG disintegrating tablet Take 1 tablet (  4 mg total) by mouth every 8 (eight) hours as needed for nausea or vomiting. 01/24/20  Yes Sharman Cheek, MD  ORTHO-NOVUM 7/7/7, 28, 0.5/0.75/1-35 MG-MCG tablet Take by mouth. 12/01/20  Yes [provider]  SUMAtriptan (IMITREX) 100 MG tablet Take 100 mg by mouth daily as needed. 04/08/20  Yes [provider]  Vitamin D, Ergocalciferol, (DRISDOL)  1.25 MG (50000 UNIT) CAPS capsule Take 50,000 Units by mouth once a week. 01/15/20  Yes [provider]  zonisamide (ZONEGRAN) 100 MG capsule Take 1 capsule (100 mg total) by mouth daily. 11/23/19 03/07/21 Yes Charlsie Quest, MD  ibuprofen (MOTRIN IB) 200 MG tablet Take 3 tablets (600 mg total) by mouth every 6 (six) hours as needed. Patient not taking: No sig reported 01/24/20   Sharman Cheek, MD  levETIRAcetam (KEPPRA) 500 MG tablet Take 2 tablets (1,000 mg total) by mouth 2 (two) times daily. 10/10/18 09/26/20  Houston Siren, MD    Allergies Patient has no known allergies.  Family History  Problem Relation Age of Onset   Healthy Mother    Anemia Mother    Hypertension Mother    Fibroids Mother    Healthy Father    Learning disabilities Daughter    Cancer Maternal Grandmother    Thyroid disease Neg Hx    Diabetes Neg Hx     Social History Social History   Tobacco Use   Smoking status: Never   Smokeless tobacco: Never  Vaping Use   Vaping Use: Never used  Substance Use Topics   Alcohol use: No   Drug use: No     Review of Systems patient largely unable to answer review of systems due to postictal state.  Neurological: Positive for seizure lasting 5 to 10 minutes according to EMS.  On my assessment, patient has been in the ED for roughly an hour and a half and is still postictal.  10 System ROS otherwise negative.  ____________________________________________   PHYSICAL EXAM:  VITAL SIGNS: ED Triage Vitals  Enc Vitals Group     BP 03/07/21 1140 101/73     Pulse Rate 03/07/21 1140 (!) 104     Resp 03/07/21 1140 16     Temp 03/07/21 1140 98.7 F (37.1 C)     Temp Source 03/07/21 1140 Oral     SpO2 03/07/21 1140 99 %     Weight 03/07/21 1144 200 lb (90.7 kg)     Height 03/07/21 1144 5\' 3"  (1.6 m)     Head Circumference --      Peak Flow --      Pain Score --      Pain Loc --      Pain Edu? --      Excl. in GC? --      Constitutional:  Generally well appearing and in no acute distress. Eyes: Conjunctivae are normal. PERRL. EOMI. Head: Atraumatic. ENT:      Ears:       Nose: No congestion/rhinnorhea.      Mouth/Throat: Mucous membranes are moist.  Neck: No stridor.    Cardiovascular: Normal rate, regular rhythm. Normal S1 and S2.  Good peripheral circulation. Respiratory: Normal respiratory effort without tachypnea or retractions. Lungs CTAB. Good air entry to the bases with no decreased or absent breath sounds. Gastrointestinal: Bowel sounds 4 quadrants. Soft and no wincing or withdrawing to palpation of the abdomen.  No guarding or rigidity. No palpable masses. No distention.  Musculoskeletal: No gross deformities appreciated. Neurologic:  Patient is postictal at this time.  Will nod yes or no occasionally to questioning.  Is nonverbal at this time. Skin:  Skin is warm, dry and intact. No rash noted.  No lacerations or abrasions noted. Psychiatric: Unable to assess due to postictal state   ____________________________________________   LABS (all labs ordered are listed, but only abnormal results are displayed)  Labs Reviewed  CBC - Abnormal; Notable for the following components:      Result Value   Hemoglobin 11.2 (*)    HCT 35.9 (*)    MCV 78.9 (*)    MCH 24.6 (*)    All other components within normal limits  BASIC METABOLIC PANEL - Abnormal; Notable for the following components:   Creatinine, Ser 1.10 (*)    All other components within normal limits  URINALYSIS, COMPLETE (UACMP) WITH MICROSCOPIC - Abnormal; Notable for the following components:   Color, Urine YELLOW (*)    APPearance HAZY (*)    Hgb urine dipstick SMALL (*)    Bacteria, UA RARE (*)    All other components within normal limits  URINE DRUG SCREEN, QUALITATIVE ([facility] ONLY) - Abnormal; Notable for the following components:   Tricyclic, Ur Screen POSITIVE (*)    All other components within normal limits  CBG MONITORING, ED - Abnormal; Notable  for the following components:   Glucose-Capillary 67 (*)    All other components within normal limits  RESP PANEL BY RT-PCR (FLU A&B, COVID) ARPGX2  LEVETIRACETAM LEVEL  LAMOTRIGINE LEVEL  HIV ANTIBODY (ROUTINE TESTING W REFLEX)  MAGNESIUM  MAGNESIUM  COMPREHENSIVE METABOLIC PANEL  CBC  ETHANOL  HEMOGLOBIN A1C  TSH  BLOOD GAS, VENOUS   ____________________________________________  EKG   ____________________________________________  RADIOLOGY I personally viewed and evaluated these images as part of my medical decision making, as well as reviewing the written report by the radiologist.  ED Provider Interpretation: No acute findings on CT head  CT Head Wo Contrast  Result Date: 03/07/2021 CLINICAL DATA:  Seizure EXAM: CT HEAD WITHOUT CONTRAST TECHNIQUE: Contiguous axial images were obtained from the base of the skull through the vertex without intravenous contrast. COMPARISON:  10/25/2020 FINDINGS: Brain: No evidence of acute infarction, hemorrhage, hydrocephalus, extra-axial collection or mass lesion/mass effect. Vascular: No hyperdense vessel or unexpected calcification. Skull: Normal. Negative for fracture or focal lesion. Sinuses/Orbits: No acute finding. Other: None. IMPRESSION: No acute intracranial pathology. Electronically Signed   By: Lauralyn Primes M.D.   On: 03/07/2021 13:58    ____________________________________________    PROCEDURES  Procedure(s) performed:    Procedures    Medications  dextrose 10 % infusion ( Intravenous New Bag/Given 03/07/21 1844)  acetaminophen (TYLENOL) tablet 650 mg (has no administration in time range)    Or  acetaminophen (TYLENOL) suppository 650 mg (has no administration in time range)  LORazepam (ATIVAN) injection 2 mg (has no administration in time range)  levETIRAcetam (KEPPRA) IVPB 1000 mg/100 mL premix (has no administration in time range)  dextrose 50 % solution 50 mL (has no administration in time range)  levETIRAcetam  (KEPPRA) IVPB 500 mg/100 mL premix (0 mg Intravenous Stopped 03/07/21 1430)  levETIRAcetam (KEPPRA) IVPB 1500 mg/ 100 mL premix (0 mg Intravenous Stopped 03/07/21 1844)     ____________________________________________   INITIAL IMPRESSION / ASSESSMENT AND PLAN / ED COURSE  Pertinent labs & imaging results that were available during my care of the patient were reviewed by me and considered in my medical decision making (see chart for details).  Review of the [place] CSRS was performed in accordance of the [facility] prior to dispensing any controlled drugs.           Patient's diagnosis is consistent with seizure with prolonged postictal state.  Patient presented to the ED after having a generalized tonic-clonic seizure.  Patient largely has absence type seizures with her known epilepsy.  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  Patient has been followed by neurology and they are adjusting medications currently.  It appears that neurology is wanting to wean the patient off of Keppra onto lamotrigine but she is currently on both medications.  Patient had a seizure today and the family presented and stated that this was atypical even for her general tonic-clonic activity.  Patient is also had a prolonged postictal state which is atypical for the patient as well.  Overall exam was reassuring with no evidence of trauma from her seizure-like activity.  Patient will arouse with stimuli, attempts to answer yes/no questions with shaking her head but has largely been noncontributory towards her history.  Family members provide extra history once they arrived.  Patient was given initial dose of 500 of Keppra.  She has had no return of seizure activity but has had a prolonged postictal state.  She is currently still postictal over 6 hours after her last seizure.  I discussed the case with neurology who is on-call and they advised to admit the patient for prolonged postictal state,  continue her nighttime antiepileptic medications when the patient awakes.  They are going to give 1500 of Keppra at this time.  Patient may have 2 of Ativan for any seizure-like activity..  They will be able to round on the patient in the morning.  At this time I will reach out to the hospitalist team for admission.    ____________________________________________  FINAL CLINICAL IMPRESSION(S) / ED DIAGNOSES  Final diagnoses:  Seizure (HCC)  Postictal state (HCC)      NEW MEDICATIONS STARTED DURING THIS VISIT:  ED Discharge Orders     None           This chart was dictated using voice recognition software/Dragon. Despite best efforts to proofread, errors can occur which can change the meaning. Any change was purely unintentional.    Racheal Patches, PA-C 03/07/21 1940    Gilles Chiquito, MD 03/08/21 2209

## 2021-03-07 NOTE — ED Notes (Signed)
Taken to CT.

## 2021-03-07 NOTE — ED Notes (Signed)
Pt brought back and hooked to purewick. Pt able to slowly lift hips to assist. Does not respond with any verbal response  but tracks with eyes.

## 2021-03-07 NOTE — ED Notes (Signed)
IV team at bedside 

## 2021-03-07 NOTE — Progress Notes (Addendum)
Brief note regarding plan, with full H&P to follow:  35 year old female with history of seizure disorder who has been recently undergoing modifications to her outpatient antiepileptic regimen, who presents with a tonic-clonic seizure at home earlier today lasting 5 to 10 minutes associated with postictal state.  No subsequent seizures, including no witnessed seizure-like activity in the ED today.  Outpatient transition from Keppra to lamotrigine was reportedly started approximately 2 weeks ago.  Upon arrival to the ED, patient received Keppra 500 mg IV x1 .Case was discussed with on-call neurology, Dr.Stack, who recommends admission to the hospitalist service at [facility] for further evaluation management of seizures.  She recommends an additional Keppra 1500 mg IV  x1, and will see patient in the morning to further evaluate need for additional modifications to epileptic regimen, and recommends as needed IV Ativan for breakthrough seizure in the interval, without need for additional imaging or EEG.    Newton Pigg, DO Hospitalist

## 2021-03-07 NOTE — ED Triage Notes (Signed)
Pt arrives to ER after a seizure. Pt answering questions with nodding or shaking head but not talking. Hx seizures. Denies missing meds. States pain in head

## 2021-03-07 NOTE — H&P (Signed)
History and Physical    PLEASE NOTE THAT DRAGON DICTATION SOFTWARE WAS USED IN THE CONSTRUCTION OF THIS NOTE.   Jill [name]:[id number] DOB: [birth year] DOA: 03/07/2021  [facility]P: [facility] Patient coming from: home   I have personally briefly reviewed patient's old medical records in [facility] Link  Chief Complaint: seizure  HPI: Jill [name] is a 35 y.o. female with medical history significant for seizure disorder, chronic anemia associated with baseline hemoglobin 9.5-11.5, who is admitted to [facility] on 03/07/2021 with breakthrough tonic-clonic seizure with postictal state after presenting from home to [facility] ED via EMS for further evaluation of such.   The following history is obtained via my discussions with the patient as well as my discussions with the patient's mother, who was present at bedside, in addition to my discussions with the EDP and via chart review.  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  Up until 2 weeks ago, her home antiepileptic regimen consists of Keppra 500 mg p.o. twice daily.  However, under guidance of her outpatient neurologist, starting 2 weeks ago, her Keppra dose was reduced to 500 mg p.o. every morning at which time she was started on Lamictal, although the patient and her mother do not recall her current dose of Lamictal.  They deny that the patient is on any other antiepileptic medications at this time.  Around 1300 today, while at home, the patient reported generalized tonic-clonic seizure, involving all 4 extremities, which was witnessed by the patient's family, this episode reportedly lasted 5 to 10 minutes with spontaneous termination in the absence of any interval antiepileptic medications.  After this reported episode, patient was noted to be somnolent, with  diminished responsiveness to verbal and tactile stimuli, and unable to follow instructions.  EMS was called, and reportedly noted that the tonic-clonic activity had completely resolved prior to their arrival, and noted no additional seizure-like activity in route to [facility] ED, over which time they administered no antiepileptic medications, including no Ativan.  No additional seizure-like activity has been witnessed in the ED and the patient's initial somnolence and diminished responsiveness has been subsequently improving.  Family denies any known associated loss of bowel or bladder function or tongue biting associated with the above reported seizure-like activity.  The patient and her mother report good compliance with her prescribed home antiepileptic regimen, including the above recent modifications thereof.  She also reportedly underwent evaluation via EEG sometime over the last month as an outpatient.  Denies any history of alcohol abuse or recent alcohol consumption.  She also denies any history of recreational drug use, including no abuse of stimulant class recreational drugs, including no use of cocaine or methamphetamine.  Denies any chronic or recent use of benzodiazepines or opioid medications.  Denies any recent trauma.  No history of diabetes and does not take insulin or sulfonylureas as an outpatient.  The patient denies any recent subjective fever, chills, rigors, or generalized myalgias.  She reports at this time a mild symmetrical frontal headache, which is not associate with any visual or olfactory aura.  She has a history of intermittent headaches, she denies experiencing any headache in the hours leading up to the reported seizure-like activity at home earlier today.  Denies any recent acute focal weakness, acute focal numbness, paresthesias, slurred speech, facial droop, vertigo, nausea, vomiting, or acute  change in vision.  Not associate with any recent neck stiffness, sore throat, shortness  of breath, wheezing, cough, abdominal pain, diarrhea, or rash.  No recent traveling or known COVID-19 exposures.  She also denies any recent dysuria, gross hematuria, or change in urinary urgency/frequency no recent chest pain, shortness of breath, diaphoresis, or palpitations.      ED Course:  Vital signs in the ED were notable for the following: Temperature max 98.7, initial heart rate 104, which is subsequently decreased in the range of 86-89; blood pressure 97/58 -123/82; respiratory rate 13-18, oxygen saturation 98 to 100% on room air.  Labs were notable for the following: BMP notable for the following: Sodium 138, bicarbonate 29, BUN 6, creatinine 1.1 relative to most recent prior value of 1.08 in February 2022, glucose 91.  Point-of-care Accu-Chek 67.  CBC notable for white blood cell count of 5400, hemoglobin 11.2 relative to most recent prior value of 11.3 in February 2022.  Urinalysis showed no white blood cells, rare bacteria, nitrate negative, leukocyte Estrace negative.  Urinary drug screen was positive for tricyclic acids, but otherwise pan negative.  Serum Lamictal and Keppra levels have been ordered, with results currently pending.  Screening nasopharyngeal COVID-19/influenza PCR were checked in the ED today, with results currently pending.  EKG shows sinus rhythm with heart rate 96, normal intervals, and no evidence of T wave or ST changes, including no evidence of ST elevation.  Noncontrast CT of the head showed no evidence of acute intracranial process, including no evidence of intracranial hemorrhage or acute ischemic infarct.  Upon arrival at [facility] ED, the patient received Keppra 500 mg IV x1.  The patient's case was subsequently discussed with the on-call neurologist, Dr.Stack, who recommends admission to the hospitalist service at [facility] for further evaluation management of breakthrough tonic-clonic seizure.  She recommends an additional Keppra 1500 mg IV  x1, and will see patient  in the morning to further evaluate need for modifications to home epileptic regimen, and recommends as needed IV Ativan for breakthrough seizure in the interval, without need for additional imaging or EEG.  Overall, while in [facility] ED today, the following were administered: Keppra 500 mg IV x1 followed by Keppra 1500 mg IV x1; D10 at 10 cc/hr.      Review of Systems: As per HPI otherwise 10 point review of systems negative.   Past Medical History:  Diagnosis Date   Anemia    Headache    migrains   Seizures (HCC)     Past Surgical History:  Procedure Laterality Date   CESAREAN SECTION     x4   CESAREAN SECTION N/A 10/07/2016   Procedure: CESAREAN SECTION;  Surgeon: Suzy Bouchard, MD;  Location: [facility] ORS;  Service: Obstetrics;  Laterality: N/A;   DIAGNOSTIC LAPAROSCOPY WITH REMOVAL OF ECTOPIC PREGNANCY Left 10/07/2018   Procedure: laparoscopic left salpingectomy;  Surgeon: Hildred Laser, MD;  Location: [facility] ORS;  Service: Gynecology;  Laterality: Left;   HERNIA REPAIR     umbilical x 2   UMBILICAL HERNIA REPAIR N/A 10/02/2019   Procedure: HERNIA REPAIR UMBILICAL ADULT;  Surgeon: Henrene Dodge, MD;  Location: [facility] ORS;  Service: General;  Laterality: N/A;    Social History:  reports that she has never smoked. She has never used smokeless tobacco. She reports that she does not drink alcohol and does not use drugs.   No Known Allergies  Family History  Problem Relation Age of Onset   Healthy Mother    Anemia  Mother    Hypertension Mother    Fibroids Mother    Healthy Father    Learning disabilities Daughter    Cancer Maternal Grandmother    Thyroid disease Neg Hx    Diabetes Neg Hx     Family history reviewed and not pertinent    Prior to Admission medications   Medication Sig Start Date End Date Taking? Authorizing Provider  ibuprofen (MOTRIN IB) 200 MG tablet Take 3 tablets (600 mg total) by mouth every 6 (six) hours as needed. 01/24/20   Sharman CheekStafford, Phillip, MD   levETIRAcetam (KEPPRA) 500 MG tablet Take 2 tablets (1,000 mg total) by mouth 2 (two) times daily. 10/10/18 09/26/20  Houston SirenSainani, Vivek J, MD  ondansetron (ZOFRAN ODT) 4 MG disintegrating tablet Take 1 tablet (4 mg total) by mouth every 8 (eight) hours as needed for nausea or vomiting. 01/24/20   Sharman CheekStafford, Phillip, MD  Vitamin D, Ergocalciferol, (DRISDOL) 1.25 MG (50000 UNIT) CAPS capsule Take 50,000 Units by mouth once a week. 01/15/20   [provider]  zonisamide (ZONEGRAN) 100 MG capsule Take 1 capsule (100 mg total) by mouth daily. 11/23/19 01/24/20  Charlsie QuestYadav, Priyanka O, MD     Objective    Physical Exam: Vitals:   03/07/21 1600 03/07/21 1630 03/07/21 1645 03/07/21 1700  BP: (!) 97/58 94/63  104/70  Pulse: 86 87 89 98  Resp: 16 13 12 12   Temp:      TempSrc:      SpO2: 98% 98% 98% 100%  Weight:      Height:        General: appears to be stated age; alert, oriented x4 Skin: warm, dry, no rash Head:  AT/[place] Mouth:  Oral mucosa membranes appear moist, normal dentition Neck: supple; trachea midline Heart:  RRR; did not appreciate any M/R/G Lungs: CTAB, did not appreciate any wheezes, rales, or rhonchi Abdomen: + BS; soft, ND, NT Vascular: 2+ pedal pulses b/l; 2+ radial pulses b/l Extremities: no peripheral edema, no muscle wasting Neuro: strength and sensation intact in upper and lower extremities b/l    Labs on Admission: I have personally reviewed following labs and imaging studies  CBC: Recent Labs  Lab 03/07/21 1335  WBC 5.4  HGB 11.2*  HCT 35.9*  MCV 78.9*  PLT 338   Basic Metabolic Panel: Recent Labs  Lab 03/07/21 1335  NA 138  K 3.8  CL 104  CO2 29  GLUCOSE 91  BUN 6  CREATININE 1.10*  CALCIUM 9.0   GFR: Estimated Creatinine Clearance: 77.7 mL/min (A) (by C-G formula based on SCr of 1.1 mg/dL (H)). Liver Function Tests: No results for input(s): AST, ALT, ALKPHOS, BILITOT, PROT, ALBUMIN in the last 168 hours. No results for input(s): LIPASE,  AMYLASE in the last 168 hours. No results for input(s): AMMONIA in the last 168 hours. Coagulation Profile: No results for input(s): INR, PROTIME in the last 168 hours. Cardiac Enzymes: No results for input(s): CKTOTAL, CKMB, CKMBINDEX, TROPONINI in the last 168 hours. BNP (last 3 results) No results for input(s): PROBNP in the last 8760 hours. HbA1C: No results for input(s): HGBA1C in the last 72 hours. CBG: Recent Labs  Lab 03/07/21 1743  GLUCAP 67*   Lipid Profile: No results for input(s): CHOL, HDL, LDLCALC, TRIG, CHOLHDL, LDLDIRECT in the last 72 hours. Thyroid Function Tests: No results for input(s): TSH, T4TOTAL, FREET4, T3FREE, THYROIDAB in the last 72 hours. Anemia Panel: No results for input(s): VITAMINB12, FOLATE, FERRITIN, TIBC, IRON, RETICCTPCT in the  last 72 hours. Urine analysis:    Component Value Date/Time   COLORURINE YELLOW (A) 03/07/2021 1335   APPEARANCEUR HAZY (A) 03/07/2021 1335   APPEARANCEUR Clear 04/20/2014 1426   LABSPEC 1.008 03/07/2021 1335   LABSPEC 1.004 04/20/2014 1426   PHURINE 8.0 03/07/2021 1335   GLUCOSEU NEGATIVE 03/07/2021 1335   GLUCOSEU Negative 04/20/2014 1426   HGBUR SMALL (A) 03/07/2021 1335   BILIRUBINUR NEGATIVE 03/07/2021 1335   BILIRUBINUR Negative 04/20/2014 1426   KETONESUR NEGATIVE 03/07/2021 1335   PROTEINUR NEGATIVE 03/07/2021 1335   NITRITE NEGATIVE 03/07/2021 1335   LEUKOCYTESUR NEGATIVE 03/07/2021 1335   LEUKOCYTESUR 1+ 04/20/2014 1426    Radiological Exams on Admission: CT Head Wo Contrast  Result Date: 03/07/2021 CLINICAL DATA:  Seizure EXAM: CT HEAD WITHOUT CONTRAST TECHNIQUE: Contiguous axial images were obtained from the base of the skull through the vertex without intravenous contrast. COMPARISON:  10/25/2020 FINDINGS: Brain: No evidence of acute infarction, hemorrhage, hydrocephalus, extra-axial collection or mass lesion/mass effect. Vascular: No hyperdense vessel or unexpected calcification. Skull: Normal.  Negative for fracture or focal lesion. Sinuses/Orbits: No acute finding. Other: None. IMPRESSION: No acute intracranial pathology. Electronically Signed   By: Lauralyn Primes M.D.   On: 03/07/2021 13:58     EKG: Independently reviewed, with result as described above.    Assessment/Plan   MYLEEN BRAILSFORD is a 35 y.o. female with medical history significant for seizure disorder, chronic anemia associated with baseline hemoglobin 9.5-11.5, who is admitted to [facility] on 03/07/2021 with breakthrough tonic-clonic seizure with postictal state after presenting from home to [facility] ED via EMS for further evaluation of such.    Principal Problem:   Seizures (HCC) Active Problems:   Acute encephalopathy   Hypoglycemia   Headache   Anemia     #) Generalized tonic-clonic seizure: In the setting of a known history of seizure disorder associated with prior epileptic and nonepileptic seizures, she presents with 1 episode of reported generalized tonic-clonic seizures lasting 5 to 10 minutes earlier this afternoon to 4 spontaneous resolution in the absence of any terminating antiepileptic medications, associated with ensuing postictal state but is improving, and without subsequent evidence of overt tonic-clonic activity.  This episode occurred in the context of recent modifications to her home antiepileptic regimen over the course of the last 2 weeks, at which time her Keppra dose has been decreased and, now with introduction of Lamictal, with reported good patient compliance with these recent modifications.  Suspect some contribution from recent outpatient modifications to home epileptic regimen given timing of today's seizure-like activity relative to initiation of these changes.  Unclear to me at this time what prompted the initiation of these medications to her home antiepileptic regimen.  Specifically, leading up to these recent dose adjustments, she had been on Keppra 500 mg p.o. twice  daily, with ensuing reduction to Keppra 500 mg p.o. daily (in the morning) over the last 2 weeks concomitant with initiation of Lamictal, although the patient and her mother do not recall the specific dose of this latter medication at this time.  Of note, initial POC glucose found to be slightly low at 67.  We will closely monitor ensuing blood sugar, as further detailed below.  Overall, no evidence to suggest status epilepticus at this juncture.  No other overt evidence of additional underlying contributions to presenting breakthrough seizure.  Patient subsequently denied any history of alcohol abuse or recent alcohol consumption, and no history of recreational drug use, including no abuse  of stimulant medications nor any recent use of benzodiazepines or opioids to raise suspicion for contribution from associated withdrawal.  No evidence of hyponatremia.  We will add on serum magnesium level to evaluate for hypomagnesemia.  No reported preceding trauma.  Baseline renal function.  Noncontrast CT head showed no evidence of acute intracranial process, as further detailed above, and no evidence of acute focal neurologic deficit at this time to suggest acute ischemic CVA.  We will follow for result of COVID-19 screen initiated in the ED today.  Additionally, no evidence of underlying infectious process, and the patient is nonseptic appearing.  The patient's case was discussed with the on-call neurologist, Dr.Stack, who recommends admission to the hospitalist service at [facility] for further evaluation management of breakthrough tonic-clonic seizure.  She recommends an additional Keppra 1500 mg IV  x1 (in addition to previous Keppra 500 mg IV x 1 that was administered upon arrival in ED), and will see patient in the morning to further evaluate need for modifications to home epileptic regimen, and recommends as needed IV Ativan for breakthrough seizure in the interval, without need for additional imaging or EEG.       Plan: keep NPO until passing nursing bedside swallow screen.  Seizure precautions.  As needed IV Ativan for breakthrough seizure.  Formal neurology consultation, as above.  For now, pending additional neuro recommendations, including anticipated dose adjustment to home antiepileptic regimen, will continue patient's prior home Keppra dose of 500 mg twice daily, with next dose to occur tomorrow morning.  Monitor on telemetry.  CMP in the morning.  Repeat CBC in the morning as well.  Add on serum magnesium level, as above.  Add on serum ethanol level.  Serial neurochecks have been ordered.  Serial Accu-Cheks and as needed D50 for residual hypoglycemia, as further detailed below.  Follow for result of COVID-19 screen, as above.           #) Acute encephalopathy: Somnolence and diminished responsiveness following today's generalized tonic-clonic seizure activity consistent with postictal state.  Appears improved now, with the patient now alert oriented and following instructions.  No additional contributory source aside from suspected precipitating seizure.  Does not appear septic, no evidence of underlying infectious process at this time, although screening nasopharyngeal COVID-19 PCR performed in the ED today is currently pending.  Additionally, urinalysis showed no evidence of UTI.  Per discussions with on-call neurology today, no need for additional imaging at this time, including no need to pursue overnight MRI.  CT head without acute process, as above.    Plan: Work-up and management of presenting generalized tonic-clonic seizures, as further described above. add on serum ethanol level.  Follow-up result of screening COVID-19 PCR.  CMP in the morning as well as CBC at that time.  Check TSH and VBG.  Nursing bedside swallow screen at this time, and will keep n.p.o. until patient has passed this.       #) Hypoglycemia, while glucose per presenting BMP was noted to be 91, POC Accu-Chek performed  shortly thereafter associated with glucose of 67, prompting initiation of continuous D10 10 cc/h.  Particularly in the setting of presenting breakthrough generalized tonic clonic seizure, will closely monitor ensuing blood sugar for residual or recurrent hypoglycemia, as further detailed below.  Of note, no reported history of diabetes or hypoglycemia inducing medications as an outpatient, including no uses Falla rias or insulin at home.  Plan: Point-of-care Accu-Cheks every 2 hours x4 occurrences followed by checking point-of-care Accu-Cheks  every 4 hours x3 occurrences.  As needed amp of D50 for glucose less than 70.  We will continue continuous D10 initiated in the ED for now.  Check hemoglobin A1c.     #) Headache: The patient is complaining of symmetrical frontal headache without any associated aura, that she had not noted in the hours leading up to the suspected tonic-clonic seizure earlier today.  Given distribution and lack of aura in addition to the timing of onset of his headache, tension headache is suspected.  Appears less consistent with migraine, and not associate with any acute focal neurologic deficits at this time.  Also not associate with any neck stiffness.  Plan: As needed acetaminophen.  Could consider as needed IV Toradol for residual headache after acetaminophen.  Will refrain from opioid use due to increased risk for seizures as a consequence of this class and a/w potential for relative withdrawal.  I conveying this plan with the patient and her mother, and they conveyed their understanding and amenability to this plan.       #) Chronic anemia: Documented history of such with baseline hemoglobin range of 9.5-11.5, with presenting hemoglobin consistent with his baseline range.  No evidence of active bleed.   Plan: Repeat CBC in the morning.      DVT prophylaxis: SCDs Code Status: Full code Family Communication: Case was discussed with the patient's mother, who was  present at bedside Disposition Plan: Per Rounding Team;  Consults called: case was discussed with the on-call neurologist, Dr. Selina Cooley, as further detailed above. Admission status: inpatient; med-tele.      Of note, this patient was added by me to the following Admit List/Treatment Team: [facility].      PLEASE NOTE THAT DRAGON DICTATION SOFTWARE WAS USED IN THE CONSTRUCTION OF THIS NOTE.   Angie Fava DO [facility] Pager [phone] From 6PM - 2AM  Otherwise, please contact night-coverage  [email or web address] Password [facility]   03/07/2021, 6:32 PM

## 2021-03-07 NOTE — ED Triage Notes (Signed)
Pt in via EMS from home with c/o seizure that last 5-10 minutes.  Last seizure last Monday, hx of same and takes meds for it.

## 2021-03-08 ENCOUNTER — Other Ambulatory Visit: Payer: Self-pay

## 2021-03-08 DIAGNOSIS — R338 Other retention of urine: Secondary | ICD-10-CM

## 2021-03-08 DIAGNOSIS — R569 Unspecified convulsions: Secondary | ICD-10-CM

## 2021-03-08 DIAGNOSIS — R531 Weakness: Secondary | ICD-10-CM

## 2021-03-08 DIAGNOSIS — E669 Obesity, unspecified: Secondary | ICD-10-CM

## 2021-03-08 LAB — IRON AND TIBC
Iron: 29 ug/dL (ref 28–170)
Saturation Ratios: 9 % — ABNORMAL LOW (ref 10.4–31.8)
TIBC: 318 ug/dL (ref 250–450)
UIBC: 289 ug/dL

## 2021-03-08 LAB — GLUCOSE, CAPILLARY
Glucose-Capillary: 87 mg/dL (ref 70–99)
Glucose-Capillary: 103 mg/dL — ABNORMAL HIGH (ref 70–99)
Glucose-Capillary: 95 mg/dL (ref 70–99)

## 2021-03-08 LAB — COMPREHENSIVE METABOLIC PANEL
ALT: 9 U/L (ref 0–44)
AST: 14 U/L — ABNORMAL LOW (ref 15–41)
Albumin: 3.1 g/dL — ABNORMAL LOW (ref 3.5–5.0)
Alkaline Phosphatase: 59 U/L (ref 38–126)
Anion gap: 6 (ref 5–15)
BUN: 7 mg/dL (ref 6–20)
CO2: 28 mmol/L (ref 22–32)
Calcium: 8.7 mg/dL — ABNORMAL LOW (ref 8.9–10.3)
Chloride: 106 mmol/L (ref 98–111)
Creatinine, Ser: 0.97 mg/dL (ref 0.44–1.00)
GFR, Estimated: >60 mL/min (ref >60)
Glucose, Bld: 93 mg/dL (ref 70–99)
Potassium: 3.8 mmol/L (ref 3.5–5.1)
Sodium: 140 mmol/L (ref 135–145)
Total Bilirubin: 0.4 mg/dL (ref 0.3–1.2)
Total Protein: 6.1 g/dL — ABNORMAL LOW (ref 6.5–8.1)

## 2021-03-08 LAB — BLOOD GAS, VENOUS
Acid-Base Excess: 6.2 mmol/L — ABNORMAL HIGH (ref 0.0–2.0)
Bicarbonate: 31.2 mmol/L — ABNORMAL HIGH (ref 20.0–28.0)
O2 Saturation: 92.9 %
Patient temperature: 37
pCO2, Ven: 46 mmHg (ref 44.0–60.0)
pH, Ven: 7.44 — ABNORMAL HIGH (ref 7.250–7.430)
pO2, Ven: 64 mmHg — ABNORMAL HIGH (ref 32.0–45.0)

## 2021-03-08 LAB — ETHANOL: Alcohol, Ethyl (B): <10 mg/dL (ref <10)

## 2021-03-08 LAB — CBC
HCT: 33.4 % — ABNORMAL LOW (ref 36.0–46.0)
Hemoglobin: 10.4 g/dL — ABNORMAL LOW (ref 12.0–15.0)
MCH: 24.6 pg — ABNORMAL LOW (ref 26.0–34.0)
MCHC: 31.1 g/dL (ref 30.0–36.0)
MCV: 79 fL — ABNORMAL LOW (ref 80.0–100.0)
Platelets: 325 10*3/uL (ref 150–400)
RBC: 4.23 MIL/uL (ref 3.87–5.11)
RDW: 14.5 % (ref 11.5–15.5)
WBC: 4.7 10*3/uL (ref 4.0–10.5)
nRBC: 0 % (ref 0.0–0.2)

## 2021-03-08 LAB — HIV ANTIBODY (ROUTINE TESTING W REFLEX): HIV Screen 4th Generation wRfx: NONREACTIVE

## 2021-03-08 LAB — MAGNESIUM: Magnesium: 2 mg/dL (ref 1.7–2.4)

## 2021-03-08 LAB — FERRITIN: Ferritin: 11 ng/mL (ref 11–307)

## 2021-03-08 MED ORDER — ZONISAMIDE 100 MG PO CAPS
600.0000 mg | ORAL_CAPSULE | Freq: Every day | ORAL | Status: DC
  Administered 2021-03-08 – 2021-03-12 (×5): 600 mg via ORAL
  Filled 2021-03-08 (×6): qty 6

## 2021-03-08 MED ORDER — LORAZEPAM 2 MG/ML IJ SOLN: 1.0000 mg | Freq: Once | INTRAMUSCULAR | Status: DC

## 2021-03-08 MED ORDER — LORAZEPAM 2 MG/ML IJ SOLN
2.0000 mg | Freq: Once | INTRAMUSCULAR | Status: AC
  Administered 2021-03-08: 12:00:00 2 mg via INTRAVENOUS
  Filled 2021-03-08: qty 1

## 2021-03-08 MED ORDER — LAMOTRIGINE 100 MG PO TABS
100.0000 mg | ORAL_TABLET | Freq: Two times a day (BID) | ORAL | Status: DC
  Administered 2021-03-08 – 2021-03-13 (×11): 100 mg via ORAL
  Filled 2021-03-08 (×11): qty 1

## 2021-03-08 MED ORDER — GABAPENTIN 100 MG PO CAPS
100.0000 mg | ORAL_CAPSULE | Freq: Three times a day (TID) | ORAL | Status: DC
  Administered 2021-03-08 – 2021-03-13 (×16): 100 mg via ORAL
  Filled 2021-03-08 (×17): qty 1

## 2021-03-08 MED ORDER — LEVETIRACETAM 500 MG PO TABS
500.0000 mg | ORAL_TABLET | Freq: Two times a day (BID) | ORAL | Status: DC
  Administered 2021-03-09 – 2021-03-13 (×9): 500 mg via ORAL
  Filled 2021-03-08 (×10): qty 1

## 2021-03-08 NOTE — Progress Notes (Signed)
Patient ID: Jill [name], female   DOB: [birth year], 35 y.o.   MRN: [id number] [facility] PROGRESS NOTE  Jill [name] LKG:[id number] DOB: [birth year] DOA: 03/07/2021 PCP: [facility]  HPI/Subjective: Patient states that she has not been able to move her left side since waking up from the seizure.  She does complain of a little headache on the top of her head radiating back.  No blurry vision.  Slight neck pain.  Able to turn her head from side to side but slowly.  Patient unable to move her left leg or arm.  When holding up her arm and letting go with just left to right back down her abdomen.  Objective: Vitals:   03/08/21 0505 03/08/21 0811  BP: 98/66 94/60  Pulse: 90 88  Resp: 16 16  Temp:  98.6 F (37 C)  SpO2: 97% 98%    Intake/Output Summary (Last 24 hours) at 03/08/2021 0951 Last data filed at 03/08/2021 0600 Gross per 24 hour  Intake 712.67 ml  Output 1600 ml  Net -887.33 ml   Filed Weights   03/07/21 1144  Weight: 90.7 kg    ROS: Review of Systems  Respiratory:  Negative for shortness of breath.   Cardiovascular:  Negative for chest pain.  Gastrointestinal:  Negative for abdominal pain, nausea and vomiting.  Musculoskeletal:  Negative for joint pain.  Neurological:  Positive for seizures and weakness.  Exam: Physical Exam HENT:     Head: Normocephalic.     Mouth/Throat:     Pharynx: No oropharyngeal exudate.  Eyes:     General: Lids are normal.     Conjunctiva/sclera: Conjunctivae normal.  Cardiovascular:     Rate and Rhythm: Normal rate and regular rhythm.     Heart sounds: Normal heart sounds, S1 normal and S2 normal.  Pulmonary:     Breath sounds: Normal breath sounds. No decreased breath sounds, wheezing, rhonchi or rales.  Abdominal:     Palpations: Abdomen is soft.     Tenderness: There is abdominal tenderness in the suprapubic area.  Musculoskeletal:     Right lower leg: No swelling.     Left lower leg: No  swelling.  Skin:    General: Skin is warm.     Findings: No rash.  Neurological:     Mental Status: She is alert and oriented to person, place, and time.     Comments: Patient unable to move her left side of the body.  When holding her left arm up over the head and letting go it flops down onto her abdomen.  When holding her left leg up and letting go and flaps right back down to the bed.  Sensory intact on left side.     Data Reviewed: Basic Metabolic Panel: Recent Labs  Lab 03/07/21 1335 03/08/21 0543  NA 138 140  K 3.8 3.8  CL 104 106  CO2 29 28  GLUCOSE 91 93  BUN 6 7  CREATININE 1.10* 0.97  CALCIUM 9.0 8.7*  MG 2.3 2.0   Liver Function Tests: Recent Labs  Lab 03/08/21 0543  AST 14*  ALT 9  ALKPHOS 59  BILITOT 0.4  PROT 6.1*  ALBUMIN 3.1*   CBC: Recent Labs  Lab 03/07/21 1335 03/08/21 0543  WBC 5.4 4.7  HGB 11.2* 10.4*  HCT 35.9* 33.4*  MCV 78.9* 79.0*  PLT 338 325     CBG: Recent Labs  Lab 03/07/21 1743 03/07/21 1959 03/07/21 2034 03/07/21 2218 03/08/21  0810  GLUCAP 67* 64* 67* 148* 87    Recent Results (from the past 240 hour(s))  Resp Panel by RT-PCR (Flu A&B, Covid) Nasopharyngeal Swab     Status: None   Collection Time: 03/07/21  6:29 PM   Specimen: Nasopharyngeal Swab; Nasopharyngeal(NP) swabs in vial transport medium  Result Value Ref Range Status   SARS Coronavirus 2 by RT PCR NEGATIVE NEGATIVE Final    Comment: (NOTE) SARS-CoV-2 target nucleic acids are NOT DETECTED.  The SARS-CoV-2 RNA is generally detectable in upper respiratory specimens during the acute phase of infection. The lowest concentration of SARS-CoV-2 viral copies this assay can detect is 138 copies/mL. A negative result does not preclude SARS-Cov-2 infection and should not be used as the sole basis for treatment or other patient management decisions. A negative result may occur with  improper specimen collection/handling, submission of specimen other than  nasopharyngeal swab, presence of viral mutation(s) within the areas targeted by this assay, and inadequate number of viral copies(<138 copies/mL). A negative result must be combined with clinical observations, patient history, and epidemiological information. The expected result is Negative.  Fact Sheet for Patients:  BloggerCourse.com  Fact Sheet for Healthcare Providers:  SeriousBroker.it  This test is no t yet approved or cleared by the [place] FDA and  has been authorized for detection and/or diagnosis of SARS-CoV-2 by FDA under an Emergency Use Authorization (EUA). This EUA will remain  in effect (meaning this test can be used) for the duration of the COVID-19 declaration under Section 564(b)(1) of the Act, 21 U.S.C.section 360bbb-3(b)(1), unless the authorization is terminated  or revoked sooner.       Influenza A by PCR NEGATIVE NEGATIVE Final   Influenza B by PCR NEGATIVE NEGATIVE Final    Comment: (NOTE) The Xpert Xpress SARS-CoV-2/FLU/RSV plus assay is intended as an aid in the diagnosis of influenza from Nasopharyngeal swab specimens and should not be used as a sole basis for treatment. Nasal washings and aspirates are unacceptable for Xpert Xpress SARS-CoV-2/FLU/RSV testing.  Fact Sheet for Patients: BloggerCourse.com  Fact Sheet for Healthcare Providers: SeriousBroker.it  This test is not yet approved or cleared by the [place] FDA and has been authorized for detection and/or diagnosis of SARS-CoV-2 by FDA under an Emergency Use Authorization (EUA). This EUA will remain in effect (meaning this test can be used) for the duration of the COVID-19 declaration under Section 564(b)(1) of the Act, 21 U.S.C. section 360bbb-3(b)(1), unless the authorization is terminated or revoked.  Performed at [facility], [address] [place], [place]  [address]      Studies: CT Head Wo Contrast  Result Date: 03/07/2021 CLINICAL DATA:  Seizure EXAM: CT HEAD WITHOUT CONTRAST TECHNIQUE: Contiguous axial images were obtained from the base of the skull through the vertex without intravenous contrast. COMPARISON:  10/25/2020 FINDINGS: Brain: No evidence of acute infarction, hemorrhage, hydrocephalus, extra-axial collection or mass lesion/mass effect. Vascular: No hyperdense vessel or unexpected calcification. Skull: Normal. Negative for fracture or focal lesion. Sinuses/Orbits: No acute finding. Other: None. IMPRESSION: No acute intracranial pathology. Electronically Signed   By: Lauralyn Primes M.D.   On: 03/07/2021 13:58    Scheduled Meds: Continuous Infusions:  levETIRAcetam 1,000 mg (03/08/21 0947)    Assessment/Plan:  Seizure, left-sided weakness.  Patient unable to move her left side on my exam.  Case discussed with neurologist and she will come see decide on further imaging.  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  Continue  Keppra as per neurology. Headache.  Obesity with a BMI of 35.43 Anemia unspecified.  We will send off iron studies Urinary retention had to be straight cathed overnight Relative hypoglycemia.  Send off sulfonylurea profile and discontinue D10 drip     Code Status:     Code Status Orders  (From admission, onward)           Start     Ordered   03/07/21 1826  Full code  Continuous        03/07/21 1826           Code Status History     Date Active Date Inactive Code Status Order ID Comments User Context   11/19/2019 0925 11/23/2019 1839 Full Code [id number]  Charlsie Quest, MD Inpatient   10/07/2018 1236 10/10/2018 1818 Full Code [id number]  Hildred Laser, MD Inpatient   10/07/2018 1211 10/07/2018 1236 Full Code [id number]  Hildred Laser, MD Inpatient   09/18/2018 2243 09/19/2018 1852 Full Code [id number]  Bertrum Sol, MD Inpatient   09/14/2018 0147 09/14/2018 1900 Full Code [id number]  Montez Morita, MD Inpatient   10/07/2016 0619 10/08/2016 0203 Full Code [id number]  Schermerhorn, Ihor Austin, MD Inpatient   10/07/2016 0550 10/07/2016 0619 Full Code [id number]  Angelia Mould, RN Inpatient   06/29/2016 1350 06/29/2016 2014 Full Code [id number]  Sharee Pimple, CNM Inpatient      Disposition Plan: Status is: Inpatient  Dispo: The patient is from: Home              Anticipated d/c is to: Hopefully home              Patient currently patient is unable to move her left side.  Spoke up with neurology to come to evaluate to see if this is a Todd's paralysis or further imaging needs to be done.   Difficult to place patient.  Hopefully not  Consultants: Neurology  Time spent: 27 minutes  Johnatha Zeidman [employer]

## 2021-03-08 NOTE — Evaluation (Signed)
Physical Therapy Evaluation Patient Details Name: Jill [name] MRN: [id number] DOB: Sep 10, 1986 Today's Date: 03/08/2021   History of Present Illness  Patient is a 35 y.o. female with medical history significant for seizure disorder, chronic anemia associated with baseline hemoglobin 9.5-11.5, who is admitted to [facility] on 03/07/2021 with breakthrough tonic-clonic seizure with postictal state after presenting from home to [facility] ED via EMS for evaluation. Now with difficulty moving left side, possible Todd's paralysis.  Clinical Impression  Patient agreeable to PT. She reports be independent with mobility at baseline and lives at home with her 5 children.  Patient currently has trace movement in LUE and LLE. Patient can activate movement in right extremities with delay. Mod A is required for bed mobility with fair sitting balance. Patient does report feeling weakness in sitting position, blood pressure 91/65 mmHg after mobilizing. Unable to sit long enough to attempt standing. Recommend PT to maximize independence and facilitate return to prior level of function. Discharge plan will need to be changed if patient is unable to progress independence with activity.     Follow Up Recommendations Supervision for mobility/OOB;Home health PT (ongoing assessment based on progress)    Equipment Recommendations   (ongoing assessment)    Recommendations for Other Services       Precautions / Restrictions Precautions Precautions: Fall Restrictions Weight Bearing Restrictions: No      Mobility  Bed Mobility Overal bed mobility: Needs Assistance Bed Mobility: Supine to Sit;Sit to Supine     Supine to sit: Mod assist Sit to supine: Mod assist   General bed mobility comments: assistance for BLE support and occasional trunk support provided. verbal cues for sequencing and task initiation    Transfers                 General transfer comment: not assessed as  patient reports feeling weakness in sitting, limited sitting tolerance. blood pressure 91/67mmHg after return to bed  Ambulation/Gait                Stairs            Wheelchair Mobility    Modified Rankin (Stroke Patients Only)       Balance Overall balance assessment: Needs assistance Sitting-balance support: Feet supported;Bilateral upper extremity supported Sitting balance-Leahy Scale: Fair                                       Pertinent Vitals/Pain Pain Assessment: No/denies pain    Home Living Family/patient expects to be discharged to:: Private residence Living Arrangements: Children Available Help at Discharge: Family Type of Home: House Home Access: Stairs to enter Entrance Stairs-Rails:  (one railing) Secretary/administrator of Steps: 4     Additional Comments: patient reports she lives with her 5 children, youngest is 51 years old    Prior Function Level of Independence: Independent               Hand Dominance        Extremity/Trunk Assessment   Upper Extremity Assessment Upper Extremity Assessment: RUE deficits/detail;LUE deficits/detail RUE Deficits / Details: AROM WFL for functional activity. slow movement overall. questionable if providing full effort with movement assessment RUE Sensation: WNL LUE Deficits / Details: trace movement noted shoulder flexion, elbow flexion/extension, and trace digit movement noted LUE Sensation: WNL    Lower Extremity Assessment Lower Extremity Assessment: RLE deficits/detail;LLE deficits/detail RLE  Deficits / Details: patient able to SLR and activate hip/knee/ankle movement. slow movement overall RLE Sensation: WNL LLE Deficits / Details: trace movement noted ankle/knee/hip. PROM WFL LLE Sensation: WNL       Communication   Communication: No difficulties  Cognition Arousal/Alertness: Awake/alert Behavior During Therapy: WFL for tasks assessed/performed Overall Cognitive  Status: No family/caregiver present to determine baseline cognitive functioning                                 General Comments: patient is slow to respond but is able to follow all directions without difficulty      General Comments      Exercises     Assessment/Plan    PT Assessment Patient needs continued PT services  PT Problem List Decreased strength;Decreased range of motion;Decreased activity tolerance;Decreased balance;Decreased mobility       PT Treatment Interventions DME instruction;Gait training;Functional mobility training;Stair training;Therapeutic activities;Therapeutic exercise;Balance training;Neuromuscular re-education;Patient/family education    PT Goals (Current goals can be found in the Care Plan section)  Acute Rehab PT Goals Patient Stated Goal: regain strength PT Goal Formulation: With patient Time For Goal Achievement: 03/22/21 Potential to Achieve Goals: Good    Frequency 7X/week   Barriers to discharge        Co-evaluation               AM-PAC PT "6 Clicks" Mobility  Outcome Measure Help needed turning from your back to your side while in a flat bed without using bedrails?: A Lot Help needed moving from lying on your back to sitting on the side of a flat bed without using bedrails?: A Lot Help needed moving to and from a bed to a chair (including a wheelchair)?: A Lot Help needed standing up from a chair using your arms (e.g., wheelchair or bedside chair)?: A Lot Help needed to walk in hospital room?: Total Help needed climbing 3-5 steps with a railing? : Total 6 Click Score: 10    End of Session   Activity Tolerance: Patient tolerated treatment well Patient left: in bed;with call bell/phone within reach;with bed alarm set   PT Visit Diagnosis: Muscle weakness (generalized) (M62.81);Difficulty in walking, not elsewhere classified (R26.2)    Time: 0350-0938 PT Time Calculation (min) (ACUTE ONLY): 28  min   Charges:   PT Evaluation $PT Eval Moderate Complexity: 1 Mod PT Treatments $Therapeutic Activity: 8-22 mins        Donna Bernard, PT, MPT   [name] 03/08/2021, 3:04 PM

## 2021-03-08 NOTE — Progress Notes (Signed)
Patient ID: Jill [name], female   DOB: 1986-07-02, 35 y.o.   MRN: [id number]  Called and updated the patient's mother.  She stated that when she was seizing for at least 15 minutes on 2 different occasions she was on her left side.  I informed her that the neurologist has seen her and had her on Keppra IV and restarted her oral medications.  She would like to watch her for another day here in the hospital.  Dr Alford Highland

## 2021-03-08 NOTE — Plan of Care (Signed)

## 2021-03-08 NOTE — Consult Note (Signed)
NEUROLOGY CONSULTATION NOTE   Date of service: March 08, 2021 Patient Name: Jill [name] MRN:  [id number] DOB:  02-05-1986 Reason for consult: breakthrough seizure w/ L sided Todd's paralysis Requesting physician: Alford Highland MD _ _ _   _ __   _ __ _ _  __ __   _ __   __ _  History of Present Illness   35 yo woman f/b Dr. 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 She was post-ictal and difficult to arouse upon arrival to ED. CTH NAICP. No tongue biting or urinary incontinence. Patient does not remember event. Per Dr. Margaretmary Eddy notes that I reviewed it appears that she has focal seizures with occasional secondary generalization and in previous hospitalizations there was also concern that she may have PNES as well although she has not undergone EMU admission. She did have interictal discharges on recent outpatient EEG.  Per patient her o/p AEDs are currently:  LEV 500mg  q 12 hrs (Dr. was planning to wean this off after LTG uptitrated) LTG 100mg  q 12 hrs Zonisamide 600mg  qhs Gabapentin 100mg  tid  She was loaded on LEV in ED and continued on 1000mg  q 12 hrs while she was unable to take po.   Her mental status has improved to baseline but she has L sided weakness today which is new. It did improve over course of multiple exams today but has not yet resolved. She reports no other neurologic deficits at this time. She does have h/a which is chronic for her.  UA negative for infection. No recent respiratory sx. Pt reports no missed AED doses but does endorse recent sleep deprivation 2/2 her children as well as anxiety.    ROS   Per HPI; all other systems reviewed and are negative  Past History   Past Medical History:  Diagnosis Date   Anemia    Headache    migrains   Seizures (HCC)    Past Surgical History:  Procedure Laterality Date   CESAREAN SECTION     x4   CESAREAN  SECTION N/A 10/07/2016   Procedure: CESAREAN SECTION;  Surgeon: , MD;  Location: [facility] ORS;  Service: Obstetrics;  Laterality: N/A;   DIAGNOSTIC LAPAROSCOPY WITH REMOVAL OF ECTOPIC PREGNANCY Left 10/07/2018   Procedure: laparoscopic left salpingectomy;  Surgeon: , MD;  Location: [facility] ORS;  Service: Gynecology;  Laterality: Left;   HERNIA REPAIR     umbilical x 2   UMBILICAL HERNIA REPAIR N/A 10/02/2019   Procedure: HERNIA REPAIR UMBILICAL ADULT;  Surgeon: 10/09/2016, MD;  Location: [facility] ORS;  Service: General;  Laterality: N/A;   Family History  Problem Relation Age of Onset   Healthy Mother    Anemia Mother    Hypertension Mother    Fibroids Mother    Healthy Father    Learning disabilities Daughter    Cancer Maternal Grandmother    Thyroid disease Neg Hx    Diabetes Neg Hx    Social History   Socioeconomic History   Marital status: Single    Spouse name: Not on file   Number of children: 5   Years of education: 12   Highest education level: Not on file  Occupational History   Not on file  Tobacco Use   Smoking status: Never   Smokeless tobacco: Never  Vaping Use   Vaping Use: Never used  Substance and Sexual Activity   Alcohol use: No   Drug use: No   Sexual activity: Not Currently  Other Topics Concern   Not on file  Social History Narrative   Lives in a one story home with her 5 children.  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  Education: high school.     Social Determinants of Health   Financial Resource Strain: Not on file  Food Insecurity: Not on file  Transportation Needs: Not on file  Physical Activity: Not on file  Stress: Not on file  Social Connections: Not on file   No Known Allergies  Medications   Medications Prior to Admission  Medication Sig Dispense Refill Last Dose   furosemide (LASIX) 20 MG tablet Take 20 mg by mouth daily.   03/07/2021   gabapentin (NEURONTIN) 100 MG capsule Take 100 mg by mouth in the morning,  at noon, and at bedtime.   03/07/2021   lamoTRIgine (LAMICTAL) 100 MG tablet Take 100 mg by mouth 2 (two) times daily.   03/07/2021   lamoTRIgine (LAMICTAL) 25 MG tablet Take 25 mg by mouth 2 (two) times daily.   03/07/2021   levETIRAcetam (KEPPRA) 500 MG tablet Take 500 mg by mouth in the morning and at bedtime.   03/06/2021   ondansetron (ZOFRAN ODT) 4 MG disintegrating tablet Take 1 tablet (4 mg total) by mouth every 8 (eight) hours as needed for nausea or vomiting. 20 tablet 0 03/07/2021   ORTHO-NOVUM 7/7/7, 28, 0.5/0.75/1-35 MG-MCG tablet Take by mouth.   03/07/2021   SUMAtriptan (IMITREX) 100 MG tablet Take 100 mg by mouth daily as needed.   03/06/2021 at PM   Vitamin D, Ergocalciferol, (DRISDOL) 1.25 MG (50000 UNIT) CAPS capsule Take 50,000 Units by mouth once a week.   03/07/2021   zonisamide (ZONEGRAN) 100 MG capsule Take 1 capsule (100 mg total) by mouth daily. 30 capsule 1 03/07/2021   ibuprofen (MOTRIN IB) 200 MG tablet Take 3 tablets (600 mg total) by mouth every 6 (six) hours as needed. (Patient not taking: No sig reported) 60 tablet 0 Not Taking   levETIRAcetam (KEPPRA) 500 MG tablet Take 2 tablets (1,000 mg total) by mouth 2 (two) times daily. 120 tablet 1      Vitals   Vitals:   03/08/21 0505 03/08/21 0811 03/08/21 1137 03/08/21 1539  BP: 98/66 94/60 100/62 109/80  Pulse: 90 88 86 99  Resp: 16 16 16 16   Temp:  98.6 F (37 C) 98.5 F (36.9 C) 98.6 F (37 C)  TempSrc:  Oral    SpO2: 97% 98% 96% 98%  Weight:      Height:         Body mass index is 35.43 kg/m.  Physical Exam   Physical Exam Gen: A&O x4, NAD HEENT: Atraumatic, normocephalic;mucous membranes moist; oropharynx clear, tongue without atrophy or fasciculations. Neck: Supple, trachea midline. Resp: CTAB, no w/r/r CV: RRR, no m/g/r; nml S1 and S2. 2+ symmetric peripheral pulses. Abd: soft/NT/ND; nabs x 4 quad Extrem: Nml bulk; no cyanosis, clubbing, or edema.  Neuro: *MS: A&O x4. Follows multi-step commands.   *Speech: fluid, nondysarthric, able to name and repeat *CN:    I: Deferred   II,III: PERRLA, VFF by confrontation, optic discs sharp   III,IV,VI: EOMI w/o nystagmus, no ptosis   V: Sensation intact from V1 to V3 to LT   VII: Eyelid closure was full.  L UMN facial droop.   VIII: Hearing intact to voice  IX,X: Voice normal, palate elevates symmetrically    XI: SCM/trap 5/5 bilat   XII: Tongue protrudes midline, no atrophy or fasciculations   *Motor:   Normal bulk.  No tremor, rigidity or bradykinesia. RUE and RLE full strength. On initial exam today she had no movement LUE and LLE, by this afternoon she was able to wiggle fingers and toes but was not antigravity in LUE or LLE. *Sensory: Intact to light touch, pinprick, temperature vibration throughout. Symmetric. Propioception intact bilat.  No double-simultaneous extinction.  *Coordination:  FNF intact on R, UTA L *Reflexes:  2+ and symmetric throughout without clonus; toes down-going bilat *Gait: UTA 2/2 L sided weakness    Labs   CBC:  Recent Labs  Lab 03/07/21 1335 03/08/21 0543  WBC 5.4 4.7  HGB 11.2* 10.4*  HCT 35.9* 33.4*  MCV 78.9* 79.0*  PLT 338 325    Basic Metabolic Panel:  Lab Results  Component Value Date   NA 140 03/08/2021   K 3.8 03/08/2021   CO2 28 03/08/2021   GLUCOSE 93 03/08/2021   BUN 7 03/08/2021   CREATININE 0.97 03/08/2021   CALCIUM 8.7 (L) 03/08/2021   GFRNONAA >60 03/08/2021   GFRAA >60 01/24/2020   Lipid Panel: No results found for: LDLCALC HgbA1c: No results found for: HGBA1C Urine Drug Screen:     Component Value Date/Time   LABOPIA NONE DETECTED 03/07/2021 1335   LABOPIA NONE DETECTED 11/19/2019 1322   COCAINSCRNUR NONE DETECTED 03/07/2021 1335   LABBENZ NONE DETECTED 03/07/2021 1335   LABBENZ NONE DETECTED 11/19/2019 1322   AMPHETMU NONE DETECTED 03/07/2021 1335   AMPHETMU NONE DETECTED 11/19/2019 1322   THCU NONE DETECTED 03/07/2021 1335   THCU NONE DETECTED 11/19/2019 1322    LABBARB NONE DETECTED 03/07/2021 1335   LABBARB NONE DETECTED 11/19/2019 1322    Alcohol Level     Component Value Date/Time   ETH <10 03/08/2021 0543     Impression   35 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  Recommendations   - Continue LEV 500mg  bid  - Other home AEDs restarted including lamotrigine 100mg  bid, zonegran 600mg  qhs, gabapentin 100mg  tid - Given ativan 2mg  this AM to bridge her given that she missed her oral AEDs last night while post-ictal - No indication to change AEDs this hospitalization since this seizure was provoked by severe sleep deprivation and she is currently followed closely as an outpatient by Dr. - No indication for EEG at this time, would not change mgmt - Will f/u exam tomorrow. L sided weakness favored to be Todd's and expected to resolve. If persistent, consider advanced imaging. ______________________________________________________________________   Thank you for the opportunity to take part in the care of this patient. If you have any further questions, please contact the neurology consultation attending.  Signed,  , MD [facility] [phone]  If 7pm- 7am, please page neurology on call as listed in AMION.

## 2021-03-09 DIAGNOSIS — D509 Iron deficiency anemia, unspecified: Secondary | ICD-10-CM

## 2021-03-09 LAB — LAMOTRIGINE LEVEL: Lamotrigine Lvl: 5.6 ug/mL (ref 2.0–20.0)

## 2021-03-09 LAB — HEMOGLOBIN A1C
Hgb A1c MFr Bld: 5.6 % (ref 4.8–5.6)
Mean Plasma Glucose: 114 mg/dL

## 2021-03-09 MED ORDER — FERROUS SULFATE 325 (65 FE) MG PO TABS
325.0000 mg | ORAL_TABLET | Freq: Every day | ORAL | Status: DC
  Administered 2021-03-09 – 2021-03-13 (×5): 325 mg via ORAL
  Filled 2021-03-09 (×5): qty 1

## 2021-03-09 MED ORDER — MAGNESIUM SULFATE 2 GM/50ML IV SOLN
2.0000 g | Freq: Once | INTRAVENOUS | Status: AC
  Administered 2021-03-09: 14:00:00 2 g via INTRAVENOUS
  Filled 2021-03-09: qty 50

## 2021-03-09 MED ORDER — TRAZODONE HCL 50 MG PO TABS
25.0000 mg | ORAL_TABLET | Freq: Every evening | ORAL | Status: DC | PRN
Start: 2021-03-09 — End: 2021-03-13
  Administered 2021-03-09 – 2021-03-12 (×2): 25 mg via ORAL
  Filled 2021-03-09 (×2): qty 1

## 2021-03-09 NOTE — Progress Notes (Signed)
Upon entering patient's room, witnessed patient holding her cellphone with her left hand up towards her face, facetiming her mother. Arm fully lifted off the bed. Patient was able to put the cellphone back down on the bed in a controlled movement and grab a cup of ice water from nurse with left hand.

## 2021-03-09 NOTE — Progress Notes (Signed)
Patient noted to have extensive fluctuations in physical exam throughout the day. Patient claims to be unable to move her LUE, but was visualized moving it multiple times throughout the shift. While nurse was in the room with patient she was on the phone with her mother and was noted to be exaggerating events/symptoms. Pt stated, "I did horribly with therapy. I couldn't even stand or walk!" When pt was asked if she was sure she didn't stand she then replied, "well I stood, but I got dizzy so I sat back down. I just could bare to walk." Pt then told her mother, "I don't even know when I came to the hospital or why I'm here." Her mother explained she had come to the ER on 6/25 for a long lasting seizure. Pt then replied, "I don't remember any of that," and started to cry. Interestingly enough, pt had previously told nurse why she was admitted to the hospital and seemed very clear as to when she arrived. Pt also stated she has 5 children at home and hardly gets any sleep and often feels "overwhelmed." Pt endorsed "enjoying the break." Pt encouraged to do as much for herself as possible to help strengthen her left side. Pt verbalized understanding, but immediately stated "maybe in a few days I can go home, just not today."

## 2021-03-09 NOTE — Progress Notes (Signed)
Subjective: Patient with continued left sided weakness, but improved since yesterday. States that it involves her face, arm and leg. Estimates yesterday's strength level to have been about 25% of baseline and today's level to be at about 45%. No new seizures since evaluation by Dr. Selina Cooley yesterday.   Objective: Current vital signs: BP 102/65 (BP Location: Right Arm)   Pulse 99   Temp 98.3 F (36.8 C)   Resp 14   Ht 5\' 3"  (1.6 m)   Wt 115.6 kg   SpO2 97%   BMI 45.14 kg/m  Vital signs in last 24 hours: Temp:  [97.8 F (36.6 C)-98.4 F (36.9 C)] 98.3 F (36.8 C) (06/27 1921) Pulse Rate:  [82-106] 99 (06/27 1921) Resp:  [14-20] 14 (06/27 1921) BP: (88-102)/(56-65) 102/65 (06/27 1921) SpO2:  [96 %-100 %] 97 % (06/27 1921) Weight:  [115.6 kg] 115.6 kg (06/27 0500)  Intake/Output from previous day: 06/26 0701 - 06/27 0700 In: 340 [P.O.:240; IV Piggyback:100] Out: 1250 [Urine:1250] Intake/Output this shift: No intake/output data recorded. Nutritional status:  Diet Order             Diet regular Room service appropriate? Yes; Fluid consistency: Thin  Diet effective now                  HEENT: [place]/AT Lungs: Respirations unlabored  Neurologic Exam: Ment: Alert and fully oriented. Speech is fluent with intact comprehension and naming. No dysarthria.  CN: Temporal visual fields intact with no extinction to DSS. EOMI. Face is symmetric. Tongue midline Motor: Some slowing and giveway weakness with testing of LUE and LLE strength. Varies with level of distraction and has a functional component.  RUE and RLE 5/5 Sensory: Intact to FT BUE without extinction.  Cerebellar: No ataxia with FNF bilaterally, but slow on the left.   Lab Results: Results for orders placed or performed during the hospital encounter of 03/07/21 (from the past 48 hour(s))  Glucose, capillary     Status: Abnormal   Collection Time: 03/07/21 10:18 PM  Result Value Ref Range   Glucose-Capillary 148 (H) 70  - 99 mg/dL    Comment: Glucose reference range applies only to samples taken after fasting for at least 8 hours.  HIV Antibody (routine testing w rflx)     Status: None   Collection Time: 03/08/21  5:43 AM  Result Value Ref Range   HIV Screen 4th Generation wRfx Non Reactive Non Reactive    Comment: Performed at [facility], [address] [place], [place]  Magnesium     Status: None   Collection Time: 03/08/21  5:43 AM  Result Value Ref Range   Magnesium 2.0 1.7 - 2.4 mg/dL    Comment: Performed at [facility], [address] [place], [place]  Comprehensive metabolic panel     Status: Abnormal   Collection Time: 03/08/21  5:43 AM  Result Value Ref Range   Sodium 140 135 - 145 mmol/L   Potassium 3.8 3.5 - 5.1 mmol/L   Chloride 106 98 - 111 mmol/L   CO2 28 22 - 32 mmol/L   Glucose, Bld 93 70 - 99 mg/dL    Comment: Glucose reference range applies only to samples taken after fasting for at least 8 hours.   BUN 7 6 - 20 mg/dL   Creatinine, Ser 03/10/21 0.44 - 1.00 mg/dL   Calcium 8.7 (L) 8.9 - 10.3 mg/dL   Total Protein 6.1 (L) 6.5 - 8.1  g/dL   Albumin 3.1 (L) 3.5 - 5.0 g/dL   AST 14 (L) 15 - 41 U/L   ALT 9 0 - 44 U/L   Alkaline Phosphatase 59 38 - 126 U/L   Total Bilirubin 0.4 0.3 - 1.2 mg/dL   GFR, Estimated >13>60 >24>60 mL/min    Comment: (NOTE) Calculated using the CKD-EPI Creatinine Equation (2021)    Anion gap 6 5 - 15    Comment: Performed at [facility], [address] [place], [place] [address]  CBC     Status: Abnormal   Collection Time: 03/08/21  5:43 AM  Result Value Ref Range   WBC 4.7 4.0 - 10.5 K/uL   RBC 4.23 3.87 - 5.11 MIL/uL   Hemoglobin 10.4 (L) 12.0 - 15.0 g/dL   HCT 72.533.4 (L) 36.636.0 - 44.046.0 %   MCV 79.0 (L) 80.0 - 100.0 fL   MCH 24.6 (L) 26.0 - 34.0 pg   MCHC 31.1 30.0 - 36.0 g/dL   RDW 34.714.5 42.511.5 - 95.615.5 %   Platelets 325 150 - 400 K/uL   nRBC 0.0 0.0 - 0.2 %    Comment: Performed at [facility], [address] [place], [place] [address]  Ethanol     Status: None   Collection Time: 03/08/21  5:43 AM  Result Value Ref Range   Alcohol, Ethyl (B) <10 <10 mg/dL    Comment: (NOTE) Lowest detectable limit for serum alcohol is 10 mg/dL.  For medical purposes only. Performed at [facility], [address] [place], [place] [address]   Ferritin     Status: None   Collection Time: 03/08/21  5:43 AM  Result Value Ref Range   Ferritin 11 11 - 307 ng/mL    Comment: Performed at [facility], [address] [place], [place] [address]  Iron and TIBC     Status: Abnormal   Collection Time: 03/08/21  5:43 AM  Result Value Ref Range   Iron 29 28 - 170 ug/dL   TIBC 166318 063250 - 016450 ug/dL   Saturation Ratios 9 (L) 10.4 - 31.8 %   UIBC 289 ug/dL    Comment: Performed at [facility], [address] [place], [place] [address]  Blood gas, venous     Status: Abnormal   Collection Time: 03/08/21  5:44 AM  Result Value Ref Range   pH, Ven 7.44 (H) 7.250 - 7.430   pCO2, Ven 46 44.0 - 60.0 mmHg   pO2, Ven 64.0 (H) 32.0 - 45.0 mmHg   Bicarbonate 31.2 (H) 20.0 - 28.0 mmol/L   Acid-Base Excess 6.2 (H) 0.0 - 2.0 mmol/L   O2 Saturation 92.9 %   Patient temperature 37.0    Collection site VEIN    Sample type VENOUS     Comment: Performed at [facility], [address] [place], [place] [address]  Glucose, capillary     Status: None   Collection Time: 03/08/21  8:10 AM  Result Value Ref Range   Glucose-Capillary 87 70 - 99 mg/dL    Comment: Glucose reference range applies only to samples taken after fasting for at least 8 hours.  Glucose, capillary     Status: Abnormal   Collection Time: 03/08/21 11:37 AM  Result Value Ref Range   Glucose-Capillary 103 (H) 70 - 99 mg/dL    Comment: Glucose reference range applies only to samples taken after fasting for at least 8 hours.  Glucose, capillary  Status: None   Collection Time: 03/08/21  3:39 PM  Result Value  Ref Range   Glucose-Capillary 95 70 - 99 mg/dL    Comment: Glucose reference range applies only to samples taken after fasting for at least 8 hours.    Recent Results (from the past 240 hour(s))  Resp Panel by RT-PCR (Flu A&B, Covid) Nasopharyngeal Swab     Status: None   Collection Time: 03/07/21  6:29 PM   Specimen: Nasopharyngeal Swab; Nasopharyngeal(NP) swabs in vial transport medium  Result Value Ref Range Status   SARS Coronavirus 2 by RT PCR NEGATIVE NEGATIVE Final    Comment: (NOTE) SARS-CoV-2 target nucleic acids are NOT DETECTED.  The SARS-CoV-2 RNA is generally detectable in upper respiratory specimens during the acute phase of infection. The lowest concentration of SARS-CoV-2 viral copies this assay can detect is 138 copies/mL. A negative result does not preclude SARS-Cov-2 infection and should not be used as the sole basis for treatment or other patient management decisions. A negative result may occur with  improper specimen collection/handling, submission of specimen other than nasopharyngeal swab, presence of viral mutation(s) within the areas targeted by this assay, and inadequate number of viral copies(<138 copies/mL). A negative result must be combined with clinical observations, patient history, and epidemiological information. The expected result is Negative.  Fact Sheet for Patients:  BloggerCourse.com  Fact Sheet for Healthcare Providers:  SeriousBroker.it  This test is no t yet approved or cleared by the [place] FDA and  has been authorized for detection and/or diagnosis of SARS-CoV-2 by FDA under an Emergency Use Authorization (EUA). This EUA will remain  in effect (meaning this test can be used) for the duration of the COVID-19 declaration under Section 564(b)(1) of the Act, 21 U.S.C.section 360bbb-3(b)(1), unless the authorization is terminated  or revoked sooner.       Influenza A by PCR  NEGATIVE NEGATIVE Final   Influenza B by PCR NEGATIVE NEGATIVE Final    Comment: (NOTE) The Xpert Xpress SARS-CoV-2/FLU/RSV plus assay is intended as an aid in the diagnosis of influenza from Nasopharyngeal swab specimens and should not be used as a sole basis for treatment. Nasal washings and aspirates are unacceptable for Xpert Xpress SARS-CoV-2/FLU/RSV testing.  Fact Sheet for Patients: BloggerCourse.com  Fact Sheet for Healthcare Providers: SeriousBroker.it  This test is not yet approved or cleared by the [place] FDA and has been authorized for detection and/or diagnosis of SARS-CoV-2 by FDA under an Emergency Use Authorization (EUA). This EUA will remain in effect (meaning this test can be used) for the duration of the COVID-19 declaration under Section 564(b)(1) of the Act, 21 U.S.C. section 360bbb-3(b)(1), unless the authorization is terminated or revoked.  Performed at [facility], [address] [place], [place] [address]     Lipid Panel No results for input(s): CHOL, TRIG, HDL, CHOLHDL, VLDL, LDLCALC in the last 72 hours.  Studies/Results: No results found.  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 She has improved to mental status baseline today although has L sided weakness face/arm/leg, improving over the course of the past 2 days, but not fully resolved. 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  Recommendations: - Continue LEV 500 mg bid - Continue her other home AEDs: lamotrigine 100 mg bid, zonegran 600 mg qhs, gabapentin 100 mg tid - No indication to change AEDs this hospitalization since this seizure was provoked by severe sleep  deprivation and she is currently followed closely as an outpatient by Dr. Sherryll Burger - No indication for EEG at this time, would not change mgmt - L sided weakness favored to be Todd's and expected to resolve. Continue to monitor. If still present on Tuesday, obtain MRI brain.   LOS: 2 days   @Electronically  signed: Dr. 03/09/2021  8:52 PM

## 2021-03-09 NOTE — Progress Notes (Signed)
Patient ID: Jill [name], female   DOB: [birth year], 35 y.o.   MRN: [id number] [facility] PROGRESS NOTE  Jill [name] XIP:[id number] DOB: 01/16/1986 DOA: 03/07/2021 [facility]: [facility]  HPI/Subjective: Patient still complains of weakness on the left side.  She was unable to lift up or down with her toes but was able to straight leg raise with her left leg.  The first time I lifted up her left arm and flopped right back down on the bed but the second time she was able to hold it up.  Nursing staff did notice that she was able to open up her juice containers on her own and she needs to hands to do that.  Objective: Vitals:   03/09/21 1111 03/09/21 1607  BP: 92/60 (!) 91/56  Pulse: 100 92  Resp: 20 16  Temp: 98 F (36.7 C) 97.8 F (36.6 C)  SpO2: 100% 98%    Intake/Output Summary (Last 24 hours) at 03/09/2021 1612 Last data filed at 03/09/2021 1017 Gross per 24 hour  Intake 480 ml  Output 1550 ml  Net -1070 ml   Filed Weights   03/07/21 1144 03/09/21 0500  Weight: 90.7 kg 115.6 kg    ROS: Review of Systems  Respiratory:  Negative for cough and shortness of breath.   Cardiovascular:  Negative for chest pain.  Gastrointestinal:  Negative for abdominal pain, nausea and vomiting.  Exam: Physical Exam HENT:     Head: Normocephalic.     Mouth/Throat:     Pharynx: No oropharyngeal exudate.  Eyes:     General: Lids are normal.     Conjunctiva/sclera: Conjunctivae normal.     Pupils: Pupils are equal, round, and reactive to light.  Cardiovascular:     Rate and Rhythm: Normal rate and regular rhythm.     Heart sounds: Normal heart sounds, S1 normal and S2 normal.  Pulmonary:     Breath sounds: Normal breath sounds. No decreased breath sounds, wheezing, rhonchi or rales.  Abdominal:     Palpations: Abdomen is soft.     Tenderness: There is no abdominal tenderness.  Musculoskeletal:     Right ankle: No swelling.     Left ankle: No  swelling.  Skin:    General: Skin is warm.     Findings: No rash.  Neurological:     Mental Status: She is alert and oriented to person, place, and time.     Comments: Left-sided weakness unable to flex or extend at the ankle left leg.  But able to straight leg raise.  Weakness with handgrip but able to hold arm up on second attempt.     Data Reviewed: Basic Metabolic Panel: Recent Labs  Lab 03/07/21 1335 03/08/21 0543  NA 138 140  K 3.8 3.8  CL 104 106  CO2 29 28  GLUCOSE 91 93  BUN 6 7  CREATININE 1.10* 0.97  CALCIUM 9.0 8.7*  MG 2.3 2.0   Liver Function Tests: Recent Labs  Lab 03/08/21 0543  AST 14*  ALT 9  ALKPHOS 59  BILITOT 0.4  PROT 6.1*  ALBUMIN 3.1*   CBC: Recent Labs  Lab 03/07/21 1335 03/08/21 0543  WBC 5.4 4.7  HGB 11.2* 10.4*  HCT 35.9* 33.4*  MCV 78.9* 79.0*  PLT 338 325     CBG: Recent Labs  Lab 03/07/21 2034 03/07/21 2218 03/08/21 0810 03/08/21 1137 03/08/21 1539  GLUCAP 67* 148* 87 103* 95    Recent Results (from  the past 240 hour(s))  Resp Panel by RT-PCR (Flu A&B, Covid) Nasopharyngeal Swab     Status: None   Collection Time: 03/07/21  6:29 PM   Specimen: Nasopharyngeal Swab; Nasopharyngeal(NP) swabs in vial transport medium  Result Value Ref Range Status   SARS Coronavirus 2 by RT PCR NEGATIVE NEGATIVE Final    Comment: (NOTE) SARS-CoV-2 target nucleic acids are NOT DETECTED.  The SARS-CoV-2 RNA is generally detectable in upper respiratory specimens during the acute phase of infection. The lowest concentration of SARS-CoV-2 viral copies this assay can detect is 138 copies/mL. A negative result does not preclude SARS-Cov-2 infection and should not be used as the sole basis for treatment or other patient management decisions. A negative result may occur with  improper specimen collection/handling, submission of specimen other than nasopharyngeal swab, presence of viral mutation(s) within the areas targeted by this assay,  and inadequate number of viral copies(<138 copies/mL). A negative result must be combined with clinical observations, patient history, and epidemiological information. The expected result is Negative.  Fact Sheet for Patients:  BloggerCourse.com  Fact Sheet for Healthcare Providers:  SeriousBroker.it  This test is no t yet approved or cleared by the [place] FDA and  has been authorized for detection and/or diagnosis of SARS-CoV-2 by FDA under an Emergency Use Authorization (EUA). This EUA will remain  in effect (meaning this test can be used) for the duration of the COVID-19 declaration under Section 564(b)(1) of the Act, 21 U.S.C.section 360bbb-3(b)(1), unless the authorization is terminated  or revoked sooner.       Influenza A by PCR NEGATIVE NEGATIVE Final   Influenza B by PCR NEGATIVE NEGATIVE Final    Comment: (NOTE) The Xpert Xpress SARS-CoV-2/FLU/RSV plus assay is intended as an aid in the diagnosis of influenza from Nasopharyngeal swab specimens and should not be used as a sole basis for treatment. Nasal washings and aspirates are unacceptable for Xpert Xpress SARS-CoV-2/FLU/RSV testing.  Fact Sheet for Patients: BloggerCourse.com  Fact Sheet for Healthcare Providers: SeriousBroker.it  This test is not yet approved or cleared by the [place] FDA and has been authorized for detection and/or diagnosis of SARS-CoV-2 by FDA under an Emergency Use Authorization (EUA). This EUA will remain in effect (meaning this test can be used) for the duration of the COVID-19 declaration under Section 564(b)(1) of the Act, 21 U.S.C. section 360bbb-3(b)(1), unless the authorization is terminated or revoked.  Performed at [facility], [address] [place], [place] [address]      Scheduled Meds:  ferrous sulfate  325 mg Oral Q breakfast   gabapentin  100 mg  Oral TID   lamoTRIgine  100 mg Oral BID   levETIRAcetam  500 mg Oral BID   zonisamide  600 mg Oral QHS    Assessment/Plan:  Seizure and left-sided weakness.  Patient unable to move her left side yesterday but able to do a little bit better today.  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  Await neurology reevaluation today.  Continue Keppra, zonisamide, lamotrigine and gabapentin. Headache.  We will give IV magnesium Morbid obesity with a BMI of 45.14 Iron deficiency anemia.  Start ferrous sulfate Urinary retention has resolved Relative hypoglycemia      Code Status:     Code Status Orders  (From admission, onward)           Start     Ordered   03/07/21 1826  Full code  Continuous  03/07/21 1826           Code Status History     Date Active Date Inactive Code Status Order ID Comments User Context   11/19/2019 0925 11/23/2019 1839 Full Code [id number]  Charlsie Quest, MD Inpatient   10/07/2018 1236 10/10/2018 1818 Full Code [id number]  Hildred Laser, MD Inpatient   10/07/2018 1211 10/07/2018 1236 Full Code [id number]  Hildred Laser, MD Inpatient   09/18/2018 2243 09/19/2018 1852 Full Code [id number]  Bertrum Sol, MD Inpatient   09/14/2018 0147 09/14/2018 1900 Full Code [id number]  Montez Morita, MD Inpatient   10/07/2016 0619 10/08/2016 0203 Full Code [id number]  Schermerhorn, Ihor Austin, MD Inpatient   10/07/2016 0550 10/07/2016 0619 Full Code [id number]  Angelia Mould, RN Inpatient   06/29/2016 1350 06/29/2016 2014 Full Code [id number]  Sharee Pimple, CNM Inpatient      Family Communication: Updated patient's mother on the phone Disposition Plan: Status is: Inpatient  Dispo: The patient is from: Home              Anticipated d/c is to: Home with home health              Patient currently still with left-sided weakness today   Difficult to place patient.  No.  Consultants: Neurology  Time spent: 27 minutes  Ajene Carchi  [employer]

## 2021-03-09 NOTE — Progress Notes (Signed)
Physical Therapy Treatment Patient Details Name: Jill [name] MRN: [id number] DOB: 02-13-1986 Today's Date: 03/09/2021    History of Present Illness Patient is a 35 y.o. female with medical history significant for seizure disorder, chronic anemia associated with baseline hemoglobin 9.5-11.5, who is admitted to [facility] on 03/07/2021 with breakthrough tonic-clonic seizure with postictal state after presenting from home to [facility] ED via EMS for evaluation. Now with difficulty moving left side, possible Todd's paralysis.    PT Comments    Patient has made improvements with independence with basic mobility this session. Patient is actively moving left side today with delay.  Question if patient is providing full effort during mobility assessment as patient requires encouragement to participate and self limits progression of activity.  She was able to stand today without physical assistance and lift the left arm up to grip rolling walker without assistance. With standing, patient reports dizziness, closes her eyes, and request to return to bed. Vitals monitored throughout session with blood pressure 137/92 in sitting and 144/66 immediately after standing. Heart rate was elevated into the 130's-140's with activity.  She was able to perform a SLR with LLE in supine position and perform a LAQ with LLE in sitting position. No knee buckling noted with standing. Standing tolerance less than 30 seconds. Unable to progress walking as patient self limits activity.   Patient reports her mother can help her with mobility at home. Recommend HHPT. If family unable to assist her intermittently, consider SNF placement.     Follow Up Recommendations  Supervision for mobility/OOB;Home health PT     Equipment Recommendations  Rolling walker with 5" wheels;3in1 (PT)    Recommendations for Other Services       Precautions / Restrictions Precautions Precautions:  Fall Restrictions Weight Bearing Restrictions: No    Mobility  Bed Mobility Overal bed mobility: Needs Assistance Bed Mobility: Supine to Sit;Sit to Supine     Supine to sit: Min assist;HOB elevated Sit to supine: Supervision;HOB elevated   General bed mobility comments: increased time required to complete all tasks. the only physical assistance reuqired for bed mobility was for scooting hips towards edge of bed befiely. patient with more activate particpation and increased active movement of left side today. blood pressure 137/92 initially with sitting and heart rate up to 148 for a brief period.    Transfers Overall transfer level: Needs assistance Equipment used: Rolling walker (2 wheeled) Transfers: Sit to/from Stand Sit to Stand: Supervision         General transfer comment: no physical assistance required for standing. patient was able to lift her left hand up to grip the walker without physical assistance with extra time. patient reports dizziness with standing and feeling lightheaded. blood pressure 144/66 and heart rate in the 130's.  Ambulation/Gait             General Gait Details: unable to progress ambulation as patient feels dizzy, unwilling to try walking. question if patient is providing full effort with mobility efforts   Stairs             Wheelchair Mobility    Modified Rankin (Stroke Patients Only)       Balance Overall balance assessment: Needs assistance Sitting-balance support: Feet supported;Bilateral upper extremity supported Sitting balance-Leahy Scale: Good Sitting balance - Comments: no loss of balance with feet supported on floor   Standing balance support: Bilateral upper extremity supported Standing balance-Leahy Scale: Fair Standing balance comment: patient relying heavily on rolling  walker for support in standing                            Cognition Arousal/Alertness: Awake/alert Behavior During Therapy: Flat  affect Overall Cognitive Status: No family/caregiver present to determine baseline cognitive functioning                                 General Comments: patient is able to follow all commands with delay      Exercises      General Comments General comments (skin integrity, edema, etc.): patient was able to perform LAQ on RLE and LLE in sitting position and perform SLR with LLE in supine position today.      Pertinent Vitals/Pain Pain Assessment: No/denies pain    Home Living                      Prior Function            PT Goals (current goals can now be found in the care plan section) Acute Rehab PT Goals Patient Stated Goal: regain strength PT Goal Formulation: With patient Time For Goal Achievement: 03/22/21 Potential to Achieve Goals: Good Progress towards PT goals: Progressing toward goals    Frequency    7X/week      PT Plan Current plan remains appropriate    Co-evaluation              AM-PAC PT "6 Clicks" Mobility   Outcome Measure  Help needed turning from your back to your side while in a flat bed without using bedrails?: A Little Help needed moving from lying on your back to sitting on the side of a flat bed without using bedrails?: A Little Help needed moving to and from a bed to a chair (including a wheelchair)?: A Little Help needed standing up from a chair using your arms (e.g., wheelchair or bedside chair)?: A Little Help needed to walk in hospital room?: A Little Help needed climbing 3-5 steps with a railing? : A Lot 6 Click Score: 17    End of Session   Activity Tolerance: Patient limited by fatigue Patient left: in bed;with call bell/phone within reach;with bed alarm set Nurse Communication: Mobility status (also messaged Dr Renae Gloss about mobility status today) PT Visit Diagnosis: Muscle weakness (generalized) (M62.81);Difficulty in walking, not elsewhere classified (R26.2)     Time: 5732-2025 PT Time  Calculation (min) (ACUTE ONLY): 32 min  Charges:  $Therapeutic Activity: 23-37 mins                    Donna Bernard, PT, MPT    [name] 03/09/2021, 12:36 PM

## 2021-03-09 NOTE — Evaluation (Signed)
Occupational Therapy Evaluation Patient Details Name: Jill [name] MRN: [id number] DOB: 05-26-1986 Today's Date: 03/09/2021    History of Present Illness Patient is a 35 y.o. female with medical history significant for seizure disorder, chronic anemia associated with baseline hemoglobin 9.5-11.5, who is admitted to [facility] on 03/07/2021 with breakthrough tonic-clonic seizure with postictal state after presenting from home to [facility] ED via EMS for evaluation. Now with difficulty moving left side, possible Todd's paralysis.   Clinical Impression   Patient presenting with decreased I in self care, balance, functional mobility/transfers, endurance, safety awareness, and strength. Patient reports living at home independently with 5 children (ages 63-5) PTA. Pt does not use AD at baseline and does not work or drive. Her mother assists her with appointments and groceries. Pt reports she enjoys reading at home. Unsure if pt giving full effort this session with conflicting findings. Pt asked to demonstrate AROM with very slow deliberate movement but functional of R UE and 3-/5 for L UE. PROM in all planes of movement with pt resisting some movement in different plans as she was unable to predict directions of movement.Pt performing lateral leans onto elbows and returning to midline with min guard for balance. Pt noted to use L UE to grab onto bed rail and to return self to midline. Pt standing with mod A without use of AD and taking several steps forwards, backwards, and laterally with min HHA. Pt reports fatigue very quickly and returns to bed. After pt standings, her HH does increase to 140's at highest. Patient will benefit from acute OT to increase overall independence in the areas of ADLs, functional mobility,and safety awareness in order to safely discharge to next venue of care.    Follow Up Recommendations  Home health OT;Supervision/Assistance - 24 hour    Equipment  Recommendations  3 in 1 bedside commode       Precautions / Restrictions Precautions Precautions: Fall Restrictions Weight Bearing Restrictions: No      Mobility Bed Mobility Overal bed mobility: Needs Assistance Bed Mobility: Supine to Sit;Sit to Supine     Supine to sit: Min assist;HOB elevated Sit to supine: Supervision;HOB elevated   General bed mobility comments: increased time to perform tasks with min cuing for technique. Pt needing assistance for truck support to EOB.    Transfers Overall transfer level: Needs assistance Equipment used: 1 person hand held assist Transfers: Sit to/from Stand Sit to Stand: Min assist         General transfer comment: min lift assistance without use of AD. Pt taking several side steps, forward, and backwards steps.    Balance Overall balance assessment: Needs assistance Sitting-balance support: Feet supported;Bilateral upper extremity supported Sitting balance-Leahy Scale: Good Sitting balance - Comments: use of B UEs for support on EOB   Standing balance support: Bilateral upper extremity supported Standing balance-Leahy Scale: Fair Standing balance comment: support from therapist                           ADL either performed or assessed with clinical judgement   ADL Overall ADL's : Needs assistance/impaired Eating/Feeding: Supervision/ safety Eating/Feeding Details (indicate cue type and reason): Pt is feeding self in room and per staff opening containers without assist Grooming: Wash/dry face;Sitting;Set up                               Functional  mobility during ADLs: Minimal assistance;Moderate assistance General ADL Comments: standing and taking several steps without use of AD     Vision Patient Visual Report: No change from baseline              Pertinent Vitals/Pain Pain Assessment: No/denies pain     Hand Dominance Right   Extremity/Trunk Assessment Upper Extremity  Assessment Upper Extremity Assessment: RUE deficits/detail;LUE deficits/detail RUE Deficits / Details: AROM WFLs for functional activity with pt being very slow and deliberate with movement. Question if pt is providing full effort. LUE Deficits / Details: 3-/5 throughout and again unsure of she is providing full effort.PROM performed with pt resisting in various planes of movement with therapist moving UE in all different ranges and pt unable to predict movement from therapist.   Lower Extremity Assessment Lower Extremity Assessment: Defer to PT evaluation       Communication Communication Communication: No difficulties   Cognition Arousal/Alertness: Awake/alert Behavior During Therapy: Flat affect Overall Cognitive Status: No family/caregiver present to determine baseline cognitive functioning                                 General Comments: patient is able to follow all commands but slow to process   General Comments  patient was able to perform LAQ on RLE and LLE in sitting position and perform SLR with LLE in supine position today.            Home Living Family/patient expects to be discharged to:: Private residence Living Arrangements: Children Available Help at Discharge: Family;Available 24 hours/day Type of Home: House Home Access: Stairs to enter [employer] of Steps: 4 Entrance Stairs-Rails: Left Home Layout: One level     Bathroom Shower/Tub: Tub/shower unit         Home Equipment: None   Additional Comments: patient reports she lives with her 5 children, youngest is 56 years old      Prior Functioning/Environment Level of Independence: Independent                 OT Problem List: Decreased strength;Decreased activity tolerance;Impaired balance (sitting and/or standing);Decreased safety awareness;Decreased cognition;Impaired UE functional use;Decreased coordination;Decreased range of motion;Decreased knowledge of use of DME  or AE      OT Treatment/Interventions: Self-care/ADL training;Therapeutic exercise;Neuromuscular education;Energy conservation;DME and/or AE instruction;Therapeutic activities;Balance training;Patient/family education;Modalities;Manual therapy    OT Goals(Current goals can be found in the care plan section) Acute Rehab OT Goals Patient Stated Goal: regain strength OT Goal Formulation: With patient Time For Goal Achievement: 03/23/21 Potential to Achieve Goals: Fair ADL Goals Pt Will Perform Grooming: with supervision;standing Pt Will Perform Lower Body Dressing: with supervision;sit to/from stand Pt Will Transfer to Toilet: with supervision;ambulating Pt Will Perform Toileting - Clothing Manipulation and hygiene: with supervision;sit to/from stand  OT Frequency: Min 2X/week   Barriers to D/C:    none known at this time          AM-PAC OT "6 Clicks" Daily Activity     Outcome Measure Help from another person eating meals?: None Help from another person taking care of personal grooming?: A Little Help from another person toileting, which includes using toliet, bedpan, or urinal?: A Lot Help from another person bathing (including washing, rinsing, drying)?: A Lot Help from another person to put on and taking off regular upper body clothing?: A Little Help from another person to put on and taking off regular lower  body clothing?: A Lot 6 Click Score: 16   End of Session Nurse Communication: Mobility status;Precautions  Activity Tolerance:   Patient left: in bed;with call bell/phone within reach;with bed alarm set  OT Visit Diagnosis: Unsteadiness on feet (R26.81);Repeated falls (R29.6);Muscle weakness (generalized) (M62.81)                Time: 1350-1430 OT Time Calculation (min): 40 min Charges:  OT General Charges $OT Visit: 1 Visit OT Evaluation $OT Eval Moderate Complexity: 1 Mod OT Treatments $Self Care/Home Management : 8-22 mins $Neuromuscular Re-education: 8-22  mins  Jackquline Denmark, MS, OTR/L , CBIS ascom [phone]  03/09/21, 3:18 PM

## 2021-03-10 ENCOUNTER — Inpatient Hospital Stay: Payer: Medicare Other

## 2021-03-10 DIAGNOSIS — E236 Other disorders of pituitary gland: Secondary | ICD-10-CM

## 2021-03-10 NOTE — Progress Notes (Signed)
Patient ID: Jill [name], female   DOB: 09-Aug-1986, 35 y.o.   MRN: [id number] [facility] PROGRESS NOTE  BREONNA GAFFORD JJO:[id number] DOB: 1986-05-15 DOA: 03/07/2021 PCP: [facility]  HPI/Subjective: Patient still having left-sided weakness.  She is able to lift her left arm off the bed but very stiff.  Unable to have a good grip strength with left hand.  Able to straight leg raise with her left leg but unable to flex and extend at the toes.  No further seizure.  Admitted with seizure and having left-sided weakness since the seizure.  Objective: Vitals:   03/10/21 0706 03/10/21 1132  BP: 98/62 99/69  Pulse: 87 84  Resp: 17 20  Temp: 98.3 F (36.8 C) 98.2 F (36.8 C)  SpO2: 97% 97%    Intake/Output Summary (Last 24 hours) at 03/10/2021 1511 Last data filed at 03/10/2021 0428 Gross per 24 hour  Intake --  Output 1300 ml  Net -1300 ml   Filed Weights   03/07/21 1144 03/09/21 0500 03/10/21 0449  Weight: 90.7 kg 115.6 kg 112.8 kg    ROS: Review of Systems  Respiratory:  Negative for shortness of breath.   Cardiovascular:  Negative for chest pain.  Gastrointestinal:  Negative for abdominal pain, nausea and vomiting.  Exam: Physical Exam HENT:     Head: Normocephalic.     Mouth/Throat:     Pharynx: No oropharyngeal exudate.  Eyes:     General: Lids are normal.     Conjunctiva/sclera: Conjunctivae normal.     Pupils: Pupils are equal, round, and reactive to light.  Cardiovascular:     Rate and Rhythm: Normal rate and regular rhythm.     Heart sounds: Normal heart sounds, S1 normal and S2 normal.  Pulmonary:     Breath sounds: Normal breath sounds. No decreased breath sounds, wheezing, rhonchi or rales.  Abdominal:     Palpations: Abdomen is soft.     Tenderness: There is no abdominal tenderness.  Musculoskeletal:     Right lower leg: Swelling present.     Left lower leg: Swelling present.  Skin:    General: Skin is warm.      Findings: No rash.  Neurological:     Mental Status: She is alert and oriented to person, place, and time.     Comments: Able to straight leg raise bilaterally but slow with the left leg.  Unable to extend and flex at the left ankle.  Able to lift up the left arm but very stiff and unable to squeeze with her hand.     Data Reviewed: Basic Metabolic Panel: Recent Labs  Lab 03/07/21 1335 03/08/21 0543  NA 138 140  K 3.8 3.8  CL 104 106  CO2 29 28  GLUCOSE 91 93  BUN 6 7  CREATININE 1.10* 0.97  CALCIUM 9.0 8.7*  MG 2.3 2.0   Liver Function Tests: Recent Labs  Lab 03/08/21 0543  AST 14*  ALT 9  ALKPHOS 59  BILITOT 0.4  PROT 6.1*  ALBUMIN 3.1*    CBC: Recent Labs  Lab 03/07/21 1335 03/08/21 0543  WBC 5.4 4.7  HGB 11.2* 10.4*  HCT 35.9* 33.4*  MCV 78.9* 79.0*  PLT 338 325    CBG: Recent Labs  Lab 03/07/21 2034 03/07/21 2218 03/08/21 0810 03/08/21 1137 03/08/21 1539  GLUCAP 67* 148* 87 103* 95    Recent Results (from the past 240 hour(s))  Resp Panel by RT-PCR (Flu A&B, Covid)  Nasopharyngeal Swab     Status: None   Collection Time: 03/07/21  6:29 PM   Specimen: Nasopharyngeal Swab; Nasopharyngeal(NP) swabs in vial transport medium  Result Value Ref Range Status   SARS Coronavirus 2 by RT PCR NEGATIVE NEGATIVE Final    Comment: (NOTE) SARS-CoV-2 target nucleic acids are NOT DETECTED.  The SARS-CoV-2 RNA is generally detectable in upper respiratory specimens during the acute phase of infection. The lowest concentration of SARS-CoV-2 viral copies this assay can detect is 138 copies/mL. A negative result does not preclude SARS-Cov-2 infection and should not be used as the sole basis for treatment or other patient management decisions. A negative result may occur with  improper specimen collection/handling, submission of specimen other than nasopharyngeal swab, presence of viral mutation(s) within the areas targeted by this assay, and inadequate number  of viral copies(<138 copies/mL). A negative result must be combined with clinical observations, patient history, and epidemiological information. The expected result is Negative.  Fact Sheet for Patients:  BloggerCourse.com  Fact Sheet for Healthcare Providers:  SeriousBroker.it  This test is no t yet approved or cleared by the [place] FDA and  has been authorized for detection and/or diagnosis of SARS-CoV-2 by FDA under an Emergency Use Authorization (EUA). This EUA will remain  in effect (meaning this test can be used) for the duration of the COVID-19 declaration under Section 564(b)(1) of the Act, 21 U.S.C.section 360bbb-3(b)(1), unless the authorization is terminated  or revoked sooner.       Influenza A by PCR NEGATIVE NEGATIVE Final   Influenza B by PCR NEGATIVE NEGATIVE Final    Comment: (NOTE) The Xpert Xpress SARS-CoV-2/FLU/RSV plus assay is intended as an aid in the diagnosis of influenza from Nasopharyngeal swab specimens and should not be used as a sole basis for treatment. Nasal washings and aspirates are unacceptable for Xpert Xpress SARS-CoV-2/FLU/RSV testing.  Fact Sheet for Patients: BloggerCourse.com  Fact Sheet for Healthcare Providers: SeriousBroker.it  This test is not yet approved or cleared by the [place] FDA and has been authorized for detection and/or diagnosis of SARS-CoV-2 by FDA under an Emergency Use Authorization (EUA). This EUA will remain in effect (meaning this test can be used) for the duration of the COVID-19 declaration under Section 564(b)(1) of the Act, 21 U.S.C. section 360bbb-3(b)(1), unless the authorization is terminated or revoked.  Performed at [facility], [address] [place], [place] [address]      Studies: MR BRAIN WO CONTRAST  Result Date: 03/10/2021 CLINICAL DATA:  Neuro deficit, acute, stroke  suspected. EXAM: MRI HEAD WITHOUT CONTRAST TECHNIQUE: Multiplanar, multiecho pulse sequences of the brain and surrounding structures were obtained without intravenous contrast. COMPARISON:  Head CT March 07, 2021. FINDINGS: Brain: No acute infarction, hemorrhage, hydrocephalus, extra-axial collection or mass lesion. The brain parenchyma has normal morphology and signal characteristics. Mesial temporal lobes are symmetric. 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 Vascular: Normal flow voids. Skull and upper cervical spine: Normal marrow signal. Sinuses/Orbits: Negative. Other: None. IMPRESSION: 1. No acute intracranial abnormality. 2. 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 May be seen the setting of idiopathic intracranial hypertension in the appropriate clinical scenario. Electronically Signed   By: Baldemar Lenis M.D.   On: 03/10/2021 14:31    Scheduled Meds:  ferrous sulfate  325 mg Oral Q breakfast   gabapentin  100 mg Oral TID   lamoTRIgine  100 mg Oral BID   levETIRAcetam  500 mg Oral BID  zonisamide  600 mg Oral QHS   Brief history 35 year old female with history of seizure disorder coming in with seizure and having left-sided weakness since the seizure.  Past medical history of seizures, headache anemia and obesity.  Assessment/Plan:  Seizure with left-sided weakness.  Left side still with weakness.  Only able to walk 10 feet with physical therapy today.  MRI does not show any stroke.  Neurology ordered an EEG.  Loaded with Keppra upon coming in and back on zonisamide, lamotrigine and gabapentin and Keppra.  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  Could have a functional component of the weakness also. Empty sella seen on MRI of the brain.  We will send off a.m. cortisol, free T4, IGF-I, ACTH and prolactin to screen for hypopituitarism.  If cortisol level borderline, will end up needing a cosyntropin test Headaches Morbid obesity with a  BMI of 44.05 Iron deficiency anemia on ferrous sulfate Urinary retention has resolved Relative hypoglycemia.  Sugars acceptable.  Patient not a diabetic with low hemoglobin A1c.  I sent off a sulfonylurea screen but still pending.     Code Status:     Code Status Orders  (From admission, onward)           Start     Ordered   03/07/21 1826  Full code  Continuous        03/07/21 1826           Code Status History     Date Active Date Inactive Code Status Order ID Comments User Context   11/19/2019 0925 11/23/2019 1839 Full Code [id number]  Charlsie Quest, MD Inpatient   10/07/2018 1236 10/10/2018 1818 Full Code [id number]  Hildred Laser, MD Inpatient   10/07/2018 1211 10/07/2018 1236 Full Code [id number]  Hildred Laser, MD Inpatient   09/18/2018 2243 09/19/2018 1852 Full Code [id number]  Bertrum Sol, MD Inpatient   09/14/2018 0147 09/14/2018 1900 Full Code [id number]  Montez Morita, MD Inpatient   10/07/2016 0619 10/08/2016 0203 Full Code [id number]  Schermerhorn, Ihor Austin, MD Inpatient   10/07/2016 0550 10/07/2016 0619 Full Code [id number]  Angelia Mould, RN Inpatient   06/29/2016 1350 06/29/2016 2014 Full Code [id number]  Sharee Pimple, CNM Inpatient      Family Communication: Spoke with the patient's mother on the phone Disposition Plan: Status is: Inpatient  Dispo: The patient is from: Home              Anticipated d/c is to: Home              Patient currently only walked 10 feet today.  Neurology ordered an EEG.  With MRI showing empty sella we will do screening test for hypopituitarism.   Difficult to place patient.  No.  Consultants: Neurology  Time spent: 28 minutes  Giorgia W[employer]ler [employer]

## 2021-03-10 NOTE — TOC Initial Note (Signed)
Transition of Care [facility]) - Initial/Assessment Note    Patient Details  Name: Jill [name] MRN: [id number] Date of Birth: 01/10/1986  Transition of Care [facility]) CM/SW Contact:    Shelbie Hutching, RN Phone Number: 03/10/2021, 10:01 AM  Clinical Narrative:                 Patient admitted to the hospital for seizure.  RNCM met with patient at the bedside.  Patient reports that she has been having seizures since 2017 and they have not been well controlled despite medications and neurology following.   Patient lives at home with her 5 children, the oldest of which is 60.  Patient's mother, Anderson Malta helps her out at home.  Her mother is watching her children while she is here in the hospital.  Patient's mother also provides her transportation.  Patient is current with [facility] and gets her prescriptions from there. Patient agrees to home health services at discharge, she is okay with [facility].  Corene Cornea with [facility] accepted home health referral for RN, PT, and OT.  Patient has a walker at home that she uses when needed.   Expected Discharge Plan: [place] Barriers to Discharge: Continued Medical Work up   Patient Goals and CMS Choice Patient states their goals for this hospitalization and ongoing recovery are:: Wants to get better and get home CMS Medicare.gov Compare Post Acute Care list provided to:: Patient Choice offered to / list presented to : Patient  Expected Discharge Plan and Services Expected Discharge Plan: [place]   Discharge Planning Services: CM Consult Post Acute Care Choice: [place] arrangements for the past 2 months: Single Family Home                 DME Arranged: N/A DME Agency: NA       HH Arranged: RN, PT, OT [place] Agency: [place] ([place]) Date HH Agency Contacted: 03/10/21 Time HH Agency Contacted: 1000 Representative spoke with at [place]: Floydene Flock  Prior Living  Arrangements/Services Living arrangements for the past 2 months: [place] with:: Minor Children Patient language and need for interpreter reviewed:: Yes Do you feel safe going back to the place where you live?: Yes      Need for Family Participation in Patient Care: Yes (Comment) (seizure disorder) Care giver support system in place?: Yes (comment) (mother) Current home services: DME (walker) Criminal Activity/Legal Involvement Pertinent to Current Situation/Hospitalization: No - Comment as needed  Activities of Daily Living Home Assistive Devices/Equipment: None ADL Screening (condition at time of admission) Patient's cognitive ability adequate to safely complete daily activities?: Yes Is the patient deaf or have difficulty hearing?: No Does the patient have difficulty seeing, even when wearing glasses/contacts?: No Does the patient have difficulty concentrating, remembering, or making decisions?: No Patient able to express need for assistance with ADLs?: Yes Does the patient have difficulty dressing or bathing?: Yes Independently performs ADLs?: No Communication: Independent Dressing (OT): Needs assistance Is this a change from baseline?: Change from baseline, expected to last <3days Grooming: Needs assistance Is this a change from baseline?: Change from baseline, expected to last <3 days Feeding: Independent Bathing: Needs assistance Is this a change from baseline?: Change from baseline, expected to last <3 days Toileting: Needs assistance Is this a change from baseline?: Change from baseline, expected to last <3 days In/Out Bed: Needs assistance Is this a change from baseline?: Change from baseline, expected  to last <3 days Walks in Home: Needs assistance Is this a change from baseline?: Change from baseline, expected to last <3 days Does the patient have difficulty walking or climbing stairs?: No Weakness of Legs: Both Weakness of Arms/Hands:  Both  Permission Sought/Granted Permission sought to share information with : Case Manager, Family Supports, Other (comment) Permission granted to share information with : Yes, Verbal Permission Granted  Share Information with NAME: Anderson Malta  Permission granted to share info w AGENCY: [place] granted to share info w Relationship: mother     Emotional Assessment Appearance:: Appears stated age Attitude/Demeanor/Rapport: Engaged Affect (typically observed): Accepting Orientation: : Oriented to Self, Oriented to Place, Oriented to  Time Alcohol / Substance Use: Not Applicable Psych Involvement: No (comment)  Admission diagnosis:  Seizure ([place]) [R56.9] Seizures ([place]) [R56.9] Postictal state ([place]) [R56.9] Patient Active Problem List   Diagnosis Date Noted   Left-sided weakness    Obesity (BMI 35.0-39.9 without comorbidity)    Acute urinary retention    Seizures ([place]) 03/07/2021   Acute encephalopathy 03/07/2021   Hypoglycemia 03/07/2021   Headache 03/07/2021   Anemia    Obesity, Class III, BMI 40-49.9 (morbid obesity) ([place]) 01/29/2020   Convulsion ([place]) 84/69/6295   Umbilical hernia without obstruction and without gangrene    Ruptured ectopic pregnancy with bilateral salpingectomy 04/24/18 & 10/07/18 10/07/2018   History of ectopic pregnancy with bilateral salpingectomy 04/24/18 & 10/07/18 09/23/2018   Seizure (HCC)--Keppra 09/14/2018   History of absence seizures 04/25/2018   Chronic kidney disease    Episode of loss of consciousness    Anemia during pregnancy 07/14/2016   Trichomoniasis 05/19/2016   Hx of migraines 04/05/2016   History of cesarean delivery x5 04/05/2016   PCP:  Center, [place]:   [facility] [id number], [place] [address] [place] [address] Phone: [phone] Fax: [phone]     Social Determinants of Health (SDOH)  Interventions    Readmission Risk Interventions No flowsheet data found.

## 2021-03-10 NOTE — Progress Notes (Signed)
Subjective: States that her LUE is improved today, but that her LLE is still weak.   Objective: Current vital signs: BP 98/62 (BP Location: Right Wrist)   Pulse 87   Temp 98.3 F (36.8 C)   Resp 17   Ht 5\' 3"  (1.6 m)   Wt 112.8 kg   SpO2 97%   BMI 44.05 kg/m  Vital signs in last 24 hours: Temp:  [97.8 F (36.6 C)-98.3 F (36.8 C)] 98.3 F (36.8 C) (06/28 0706) Pulse Rate:  [87-100] 87 (06/28 0706) Resp:  [14-20] 17 (06/28 0706) BP: (91-102)/(56-65) 98/62 (06/28 0706) SpO2:  [97 %-100 %] 97 % (06/28 0706) Weight:  [112.8 kg] 112.8 kg (06/28 0449)  Intake/Output from previous day: 06/27 0701 - 06/28 0700 In: 240 [P.O.:240] Out: 1600 [Urine:1600] Intake/Output this shift: No intake/output data recorded. Nutritional status:  Diet Order             Diet regular Room service appropriate? Yes; Fluid consistency: Thin  Diet effective now                   HEENT: [place]/AT Lungs: Respirations unlabored   Neurologic Exam: Ment: Alert and oriented. Speech is fluent without evidence for aphasia. No dysarthria. Temp sensation equal bilaterally.  CN: Tracks and fixates normally. EOMI. Face is symmetric. Tongue midline Motor: Some slowing and giveway weakness with testing of upper and lower extremities. 7/28 is slightly worse on the left relative to the right.  Sensory: Intact to FT. Subjectively decreased temp sensation to LUE.  Cerebellar: No ataxia with FNF bilaterally, but slow on both sides.   Lab Results: Results for orders placed or performed during the hospital encounter of 03/07/21 (from the past 48 hour(s))  Glucose, capillary     Status: None   Collection Time: 03/08/21  8:10 AM  Result Value Ref Range   Glucose-Capillary 87 70 - 99 mg/dL    Comment: Glucose reference range applies only to samples taken after fasting for at least 8 hours.  Glucose, capillary     Status: Abnormal   Collection Time: 03/08/21 11:37 AM  Result Value Ref Range    Glucose-Capillary 103 (H) 70 - 99 mg/dL    Comment: Glucose reference range applies only to samples taken after fasting for at least 8 hours.  Glucose, capillary     Status: None   Collection Time: 03/08/21  3:39 PM  Result Value Ref Range   Glucose-Capillary 95 70 - 99 mg/dL    Comment: Glucose reference range applies only to samples taken after fasting for at least 8 hours.    Recent Results (from the past 240 hour(s))  Resp Panel by RT-PCR (Flu A&B, Covid) Nasopharyngeal Swab     Status: None   Collection Time: 03/07/21  6:29 PM   Specimen: Nasopharyngeal Swab; Nasopharyngeal(NP) swabs in vial transport medium  Result Value Ref Range Status   SARS Coronavirus 2 by RT PCR NEGATIVE NEGATIVE Final    Comment: (NOTE) SARS-CoV-2 target nucleic acids are NOT DETECTED.  The SARS-CoV-2 RNA is generally detectable in upper respiratory specimens during the acute phase of infection. The lowest concentration of SARS-CoV-2 viral copies this assay can detect is 138 copies/mL. A negative result does not preclude SARS-Cov-2 infection and should not be used as the sole basis for treatment or other patient management decisions. A negative result may occur with  improper specimen collection/handling, submission of specimen other than nasopharyngeal swab, presence of viral mutation(s) within the areas targeted by  this assay, and inadequate number of viral copies(<138 copies/mL). A negative result must be combined with clinical observations, patient history, and epidemiological information. The expected result is Negative.  Fact Sheet for Patients:  BloggerCourse.com  Fact Sheet for Healthcare Providers:  SeriousBroker.it  This test is no t yet approved or cleared by the [place] FDA and  has been authorized for detection and/or diagnosis of SARS-CoV-2 by FDA under an Emergency Use Authorization (EUA). This EUA will remain  in effect  (meaning this test can be used) for the duration of the COVID-19 declaration under Section 564(b)(1) of the Act, 21 U.S.C.section 360bbb-3(b)(1), unless the authorization is terminated  or revoked sooner.       Influenza A by PCR NEGATIVE NEGATIVE Final   Influenza B by PCR NEGATIVE NEGATIVE Final    Comment: (NOTE) The Xpert Xpress SARS-CoV-2/FLU/RSV plus assay is intended as an aid in the diagnosis of influenza from Nasopharyngeal swab specimens and should not be used as a sole basis for treatment. Nasal washings and aspirates are unacceptable for Xpert Xpress SARS-CoV-2/FLU/RSV testing.  Fact Sheet for Patients: BloggerCourse.com  Fact Sheet for Healthcare Providers: SeriousBroker.it  This test is not yet approved or cleared by the [place] FDA and has been authorized for detection and/or diagnosis of SARS-CoV-2 by FDA under an Emergency Use Authorization (EUA). This EUA will remain in effect (meaning this test can be used) for the duration of the COVID-19 declaration under Section 564(b)(1) of the Act, 21 U.S.C. section 360bbb-3(b)(1), unless the authorization is terminated or revoked.  Performed at [facility], [address] [place], [place] [address]     Lipid Panel No results for input(s): CHOL, TRIG, HDL, CHOLHDL, VLDL, LDLCALC in the last 72 hours.  Studies/Results: No results found.  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 She had improved to mental status baseline on Sunday although she continued to have left sided weakness involving her face/arm/leg which was still present, but significantly improved on Monday's follow up  exam. 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 - MRI has been completed, with results pending.  - Still with subjective left sided weakness. Exam findings are most consistent with functional weakness. She has been observed to have normal left sided motor function by nursing when distracted.   Recommendations: - Continue LEV 500 mg bid - Continue her other home AEDs: lamotrigine 100 mg bid, zonegran 600 mg qhs, gabapentin 100 mg tid - No indication to change AEDs this hospitalization since this seizure was provoked by severe sleep deprivation and she is currently followed closely as an outpatient by Dr. Sherryll Burger - Although the likelihood of recurrent subclinical seizures resulting in persistent left sided Todd's paresis is unlikely, will obtain EEG to further assess - Left-sided weakness favored to be Todd's versus functional/embellished and expected to resolve.      LOS: 3 days   @Electronically  signed: Dr. 03/10/2021  8:06 AM

## 2021-03-10 NOTE — Progress Notes (Signed)
OT Cancellation Note  Patient Details Name: Jill [name] MRN: [id number] DOB: [birth year]   Cancelled Treatment:    Reason Eval/Treat Not Completed: Patient at procedure or test/ unavailable. OT to follow up when pt is next available.   Jackquline Denmark, MS, OTR/L , CBIS ascom [phone]  03/10/21, 3:33 PM

## 2021-03-10 NOTE — Progress Notes (Signed)
Physical Therapy Treatment Patient Details Name: Jill [name] MRN: [id number] DOB: [birth year] Today's Date: 03/10/2021    History of Present Illness Patient is a 35 y.o. female with medical history significant for seizure disorder, chronic anemia associated with baseline hemoglobin 9.5-11.5, who is admitted to [facility] on 03/07/2021 with breakthrough tonic-clonic seizure with postictal state after presenting from home to [facility] ED via EMS for evaluation. Now with difficulty moving left side, possible Todd's paralysis.    PT Comments    Patient agreeable to PT session. She reports she will be getting an MRI today and was asking about timing of potential discharge. Patient progressed activity this session and was able to ambulate a short distance in the room using rolling walker  without knee buckling. She did have one loss of balance/unsteadiness that required minimal assistance- min guard assistance to correct to midline. All motor activity is delayed, both on right and left side, and questionable if patient is providing full effort  during mobility tasks. Recommend to increase out of bed activity including using bed side commode, sitting up in recliner chair, etc. with staff assistance. Patient declined sitting up in chair at the end of this session.     Follow Up Recommendations  Supervision for mobility/OOB;Home health PT     Equipment Recommendations  3in1 (PT) (patient has rolling walker in place at home already)    Recommendations for Other Services       Precautions / Restrictions Precautions Precautions: Fall Restrictions Weight Bearing Restrictions: No    Mobility  Bed Mobility Overal bed mobility: Needs Assistance Bed Mobility: Supine to Sit;Sit to Supine     Supine to sit: Supervision;HOB elevated Sit to supine: Supervision   General bed mobility comments: no physical assistance required for bed mobility    Transfers Overall transfer  level: Needs assistance Equipment used: Rolling walker (2 wheeled) Transfers: Sit to/from Stand Sit to Stand: Supervision         General transfer comment: significant extra time reuqired to complete transfers with no physical assistance required. verbal cues for LE foot placement  Ambulation/Gait Ambulation/Gait assistance: Min assist Gait Distance (Feet): 10 Feet Assistive device: Rolling walker (2 wheeled) Gait Pattern/deviations: Step-to pattern;Decreased stride length Gait velocity: decreased   General Gait Details: verbal cues for BLE and rolling walker sequencing. patient was able to take steps forward, backward, and to the side using rolling walker. heavily reliance on rolling walker for support. no knee buckling is noted with standing. one loss of balance while taking backwrds steps at the end of the walking bout that reuqired minimal assistance- Min guard assistance for correction to midline. otherwise, stand by assistance provided with ambulation. again, questionable if patient is providing full effort with ambulation and mobility efforts   Stairs             Wheelchair Mobility    Modified Rankin (Stroke Patients Only)       Balance                                            Cognition Arousal/Alertness: Awake/alert Behavior During Therapy: Flat affect Overall Cognitive Status: No family/caregiver present to determine baseline cognitive functioning  General Comments: patient able to follow commands with delay.      Exercises      General Comments        Pertinent Vitals/Pain Pain Assessment: No/denies pain    Home Living                      Prior Function            PT Goals (current goals can now be found in the care plan section) Acute Rehab PT Goals Patient Stated Goal: regain strength PT Goal Formulation: With patient Time For Goal Achievement:  03/22/21 Potential to Achieve Goals: Good Progress towards PT goals: Progressing toward goals    Frequency    7X/week      PT Plan Current plan remains appropriate    Co-evaluation              AM-PAC PT "6 Clicks" Mobility   Outcome Measure  Help needed turning from your back to your side while in a flat bed without using bedrails?: A Little Help needed moving from lying on your back to sitting on the side of a flat bed without using bedrails?: A Little Help needed moving to and from a bed to a chair (including a wheelchair)?: A Little Help needed standing up from a chair using your arms (e.g., wheelchair or bedside chair)?: A Little Help needed to walk in hospital room?: A Little Help needed climbing 3-5 steps with a railing? : A Little 6 Click Score: 18    End of Session Equipment Utilized During Treatment: Gait belt Activity Tolerance: Patient tolerated treatment well Patient left: in bed;with call bell/phone within reach;with bed alarm set Nurse Communication: Mobility status PT Visit Diagnosis: Muscle weakness (generalized) (M62.81);Difficulty in walking, not elsewhere classified (R26.2)     Time: 8546-2703 PT Time Calculation (min) (ACUTE ONLY): 36 min  Charges:  $Gait Training: 8-22 mins $Therapeutic Activity: 8-22 mins                     Donna Bernard, PT, MPT    [name] 03/10/2021, 12:41 PM

## 2021-03-10 NOTE — Progress Notes (Signed)
Eeg done 

## 2021-03-11 LAB — T4, FREE: Free T4: 0.92 ng/dL (ref 0.61–1.12)

## 2021-03-11 LAB — CORTISOL-AM, BLOOD: Cortisol - AM: 7.9 ug/dL (ref 6.7–22.6)

## 2021-03-11 NOTE — Procedures (Signed)
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   Level of alertness: Awake, asleep  AEDs during EEG study: LEV, LTG, ZNS, GBP  Technical aspects: This EEG study was done with scalp electrodes positioned according to the 10-20 International system of electrode placement. Electrical activity was acquired at a sampling rate of 500Hz  and reviewed with a high frequency filter of 70Hz  and a low frequency filter of 1Hz . EEG data were recorded continuously and digitally stored.   Description: The posterior dominant rhythm consists of 9 Hz activity of moderate voltage (25-35 uV) seen predominantly in posterior head regions, symmetric and reactive to eye opening and eye closing. Sleep was characterized by vertex waves, sleep spindles (12 to 14 Hz), maximal frontocentral region. Generalized, maximal left frontal polyspikes were noted.   Physiologic photic driving was not seen during photic stimulation.  Hyperventilation was not performed.     ABNORMALITY -Polyspikes, generalized, maximal left frontal region  IMPRESSION: This study is consistent with patient's known history of epilepsy, likely with generalized onset.  However, focal epilepsy arising from frontal region can at times have similar interictal EEG appearance.  No seizures were seen throughout the recording.  Farrell Broerman 

## 2021-03-11 NOTE — Care Management Important Message (Signed)
Important Message  Patient Details  Name: Jill [name] MRN: [id number] Date of Birth: 02/16/1986   Medicare Important Message Given:  Yes  Initial Important Message from Medicare signed today.  Will forward to HIM to scan into her medical record.   Olegario Messier A Custer Pimenta 03/11/2021, 10:29 AM

## 2021-03-11 NOTE — Progress Notes (Signed)
Patient's last bowel movement was a week ago. Stool softener and laxative offered but pt refused. Fluids provided.

## 2021-03-11 NOTE — Progress Notes (Signed)
Subjective: Feels somewhat stronger on her left side than yesterday. Had a seizure like spell in her chair today with associated urinary incontinence.   Objective: Current vital signs: BP 103/60 (BP Location: Right Arm)   Pulse 91   Temp 99 F (37.2 C)   Resp 16   Ht 5\' 3"  (1.6 m)   Wt 113.5 kg   SpO2 97%   BMI 44.32 kg/m  Vital signs in last 24 hours: Temp:  [98.2 F (36.8 C)-99 F (37.2 C)] 99 F (37.2 C) (06/29 0354) Pulse Rate:  [84-99] 91 (06/29 0354) Resp:  [16-20] 16 (06/29 0354) BP: (94-109)/(60-76) 103/60 (06/29 0354) SpO2:  [97 %-99 %] 97 % (06/29 0354) Weight:  [113.5 kg] 113.5 kg (06/29 0500)  Intake/Output from previous day: 06/28 0701 - 06/29 0700 In: 240 [P.O.:240] Out: 450 [Urine:450] Intake/Output this shift: No intake/output data recorded. Nutritional status:  Diet Order             Diet regular Room service appropriate? Yes; Fluid consistency: Thin  Diet effective now                  HEENT: [place]/AT Lungs: Respirations unlabored Ext: No edema  Neurologic Exam: Ment: Awake and alert. Speech is intact without aphasia.  CN: Visual fields are full. EOMI. Face symmetric Motor: RUE and RLE 5/5 LUE with giveway weakness and relative changes in strength with coaching. Max grip 4/5. Max deltoid, triceps and biceps 4/5.  LLE with giveway weakness. Resists examiner with 4/5 strength in knee extension and hip flexion. Giveway and inconsistent effort when testing ADF/APF.  Sensory: Intact to FT RUE and RLE, with subjective absent sensation to LUE and decreased sensation to LLE. Cerebellar: No ataxia with FNF on the right. Slow, wavering movements with left FNF appear functional.   Lab Results: No results found for this or any previous visit (from the past 48 hour(s)).  Recent Results (from the past 240 hour(s))  Resp Panel by RT-PCR (Flu A&B, Covid) Nasopharyngeal Swab     Status: None   Collection Time: 03/07/21  6:29 PM   Specimen: Nasopharyngeal  Swab; Nasopharyngeal(NP) swabs in vial transport medium  Result Value Ref Range Status   SARS Coronavirus 2 by RT PCR NEGATIVE NEGATIVE Final    Comment: (NOTE) SARS-CoV-2 target nucleic acids are NOT DETECTED.  The SARS-CoV-2 RNA is generally detectable in upper respiratory specimens during the acute phase of infection. The lowest concentration of SARS-CoV-2 viral copies this assay can detect is 138 copies/mL. A negative result does not preclude SARS-Cov-2 infection and should not be used as the sole basis for treatment or other patient management decisions. A negative result may occur with  improper specimen collection/handling, submission of specimen other than nasopharyngeal swab, presence of viral mutation(s) within the areas targeted by this assay, and inadequate number of viral copies(<138 copies/mL). A negative result must be combined with clinical observations, patient history, and epidemiological information. The expected result is Negative.  Fact Sheet for Patients:  03/09/21  Fact Sheet for Healthcare Providers:  BloggerCourse.com  This test is no t yet approved or cleared by the [place] FDA and  has been authorized for detection and/or diagnosis of SARS-CoV-2 by FDA under an Emergency Use Authorization (EUA). This EUA will remain  in effect (meaning this test can be used) for the duration of the COVID-19 declaration under Section 564(b)(1) of the Act, 21 U.S.C.section 360bbb-3(b)(1), unless the authorization is terminated  or revoked sooner.  Influenza A by PCR NEGATIVE NEGATIVE Final   Influenza B by PCR NEGATIVE NEGATIVE Final    Comment: (NOTE) The Xpert Xpress SARS-CoV-2/FLU/RSV plus assay is intended as an aid in the diagnosis of influenza from Nasopharyngeal swab specimens and should not be used as a sole basis for treatment. Nasal washings and aspirates are unacceptable for Xpert Xpress  SARS-CoV-2/FLU/RSV testing.  Fact Sheet for Patients: BloggerCourse.com  Fact Sheet for Healthcare Providers: SeriousBroker.it  This test is not yet approved or cleared by the [place] FDA and has been authorized for detection and/or diagnosis of SARS-CoV-2 by FDA under an Emergency Use Authorization (EUA). This EUA will remain in effect (meaning this test can be used) for the duration of the COVID-19 declaration under Section 564(b)(1) of the Act, 21 U.S.C. section 360bbb-3(b)(1), unless the authorization is terminated or revoked.  Performed at [facility], [address] [place], [place] [address]     Lipid Panel No results for input(s): CHOL, TRIG, HDL, CHOLHDL, VLDL, LDLCALC in the last 72 hours.  Studies/Results: MR BRAIN WO CONTRAST  Result Date: 03/10/2021 CLINICAL DATA:  Neuro deficit, acute, stroke suspected. EXAM: MRI HEAD WITHOUT CONTRAST TECHNIQUE: Multiplanar, multiecho pulse sequences of the brain and surrounding structures were obtained without intravenous contrast. COMPARISON:  Head CT March 07, 2021. FINDINGS: Brain: No acute infarction, hemorrhage, hydrocephalus, extra-axial collection or mass lesion. The brain parenchyma has normal morphology and signal characteristics. Mesial temporal lobes are symmetric. 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 Vascular: Normal flow voids. Skull and upper cervical spine: Normal marrow signal. Sinuses/Orbits: Negative. Other: None. IMPRESSION: 1. No acute intracranial abnormality. 2. 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 May be seen the setting of idiopathic intracranial hypertension in the appropriate clinical scenario. Electronically Signed   By: Baldemar Lenis M.D.   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 She had improved to mental status baseline on Sunday although she continued to have left sided weakness involving her face/arm/leg which was still present, but significantly improving gradually on daily follow up exams. Favored to be Todd's paralysis 2/2 seizure initially, but now felt more likely to be functional weakness.  - MRI brain reveals no acute intracranial abnormality. Partial empty sella, unusual for patient's age, is noted; this finding is most likely incidental, although in some patients it can be associated with idiopathic intracranial hypertension. - Still with subjective left sided weakness. Exam findings are most consistent with functional weakness. She has been observed to have normal left sided motor function by nursing when distracted. Feels that her left sided strength is further improved to "55% of normal" today.  - EEG: Findings: Polyspikes, generalized, maximal left frontal region. Impression: This study is consistent with patient's known history of epilepsy, likely with generalized onset.  However, focal epilepsy arising from frontal region can at times have similar interictal EEG appearance.  No seizures were seen throughout the recording.    Recommendations: - Continue LEV 500 mg bid - Continue her other home AEDs: lamotrigine 100 mg bid, zonegran 600 mg qhs, gabapentin 100 mg tid - No indication to change AEDs this  hospitalization since this seizure was provoked by severe sleep deprivation and she is currently followed closely as an outpatient by Dr. Sherryll Burger - Left-sided weakness is favored to be secondary to Todd's paresis versus functional/embellished and is expected to resolve.  - No further testing indicated at this time from a neurological standpoint.  - PT/OT to mobilize patient - Neurohospitalist service  will sign off. Please call if there are additional questions.    LOS: 4 days   @Electronically  signed: Dr. 03/11/2021  8:20 AM

## 2021-03-11 NOTE — Progress Notes (Signed)
PT Cancellation Note  Patient Details Name: Jill [name] MRN: [id number] DOB: [birth year]   Cancelled Treatment:    Reason Eval/Treat Not Completed: Other (comment). Pt up in chair with RN at bedside, recent ativan administered. PT to re-attempt as able.  Olga Coaster PT, DPT 11:47 AM,03/11/21

## 2021-03-11 NOTE — Progress Notes (Signed)
PROGRESS NOTE    Jill [name]  RCV:[id number] DOB: 03/09/1986 DOA: 03/07/2021 PCP: [facility]   Brief Narrative:  35 year old female with history of seizure disorder coming in with seizure and having left-sided weakness since the seizure.  Past medical history of seizures, headache anemia and obesity.  Neurology consulted and patient on several antiepileptic medications.  Per neurology patient has some residual left-sided weakness.  Functional component versus Todd's paresis.  On 6/29 patient had another seizure-like event with right hand shaking and unresponsiveness.  Resolved with administration of Ativan.  Patient has amnesia for event.   Assessment & Plan:   Principal Problem:   Seizures (HCC) Active Problems:   Obesity, Class III, BMI 40-49.9 (morbid obesity) (HCC)   Acute encephalopathy   Hypoglycemia   Headache   Anemia   Left-sided weakness   Obesity (BMI 35.0-39.9 without comorbidity)   Acute urinary retention   Empty sella (HCC)  Seizure disorder with left-sided weakness Left-sided continues to demonstrate weakness Limited ability to ambulate with physical therapy MRI negative for stroke EEG completed, neurology follow-up Per neurology unlikely to be Todd's paresis Suspect functional component to residual weakness Plan: Continue Keppra 500 twice daily Lamictal 100 mg p.o. twice daily Zonegran 600 mg nightly Gabapentin 100 mg 3 times daily Continue therapy evaluations as able  Empty sella syndrome No acute management  Headaches As needed treatment  Iron deficiency anemia P.o. ferrous sulfate  Urinary retention Resolved  Morbid obesity BMI 44 This complicates overall care and prognosis   DVT prophylaxis: SCDs Code Status: Full Family Communication: None today Disposition Plan: Status is: Inpatient  Remains inpatient appropriate because:Inpatient level of care appropriate due to severity of illness  Dispo: The  patient is from: Home              Anticipated d/c is to: Home              Patient currently is not medically stable to d/c.   Difficult to place patient No  Breakthrough seizures with residual left-sided weakness.  Unclear disposition plan at this time.     Level of care: Med-Surg  Consultants:  Neurology, signed off  Procedures:  None  Antimicrobials:  None   Subjective: Seen and examined.  Continues to endorse left-sided weakness  Objective: Vitals:   03/11/21 0354 03/11/21 0500 03/11/21 0828 03/11/21 1210  BP: 103/60  98/64 107/69  Pulse: 91  94 (!) 104  Resp: 16  14 16   Temp: 99 F (37.2 C)  98 F (36.7 C) 98.3 F (36.8 C)  TempSrc:    Oral  SpO2: 97%  95% 98%  Weight:  113.5 kg    Height:        Intake/Output Summary (Last 24 hours) at 03/11/2021 1622 Last data filed at 03/11/2021 1417 Gross per 24 hour  Intake 240 ml  Output 450 ml  Net -210 ml   Filed Weights   03/09/21 0500 03/10/21 0449 03/11/21 0500  Weight: 115.6 kg 112.8 kg 113.5 kg    Examination:  General exam: Appears calm and comfortable  Respiratory system: Clear to auscultation. Respiratory effort normal. Cardiovascular system: S1 & S2 heard, RRR. No JVD, murmurs, rubs, gallops or clicks. No pedal edema. Gastrointestinal system: Abdomen is nondistended, soft and nontender. No organomegaly or masses felt. Normal bowel sounds heard. Central nervous system: Alert and oriented. No focal neurological deficits. Extremities: Symmetric 5 x 5 power. Skin: No rashes, lesions or ulcers Psychiatry: Judgement and insight  appear impaired. Mood & affect flattened.     Data Reviewed: I have personally reviewed following labs and imaging studies  CBC: Recent Labs  Lab 03/07/21 1335 03/08/21 0543  WBC 5.4 4.7  HGB 11.2* 10.4*  HCT 35.9* 33.4*  MCV 78.9* 79.0*  PLT 338 325   Basic Metabolic Panel: Recent Labs  Lab 03/07/21 1335 03/08/21 0543  NA 138 140  K 3.8 3.8  CL 104 106  CO2  29 28  GLUCOSE 91 93  BUN 6 7  CREATININE 1.10* 0.97  CALCIUM 9.0 8.7*  MG 2.3 2.0   GFR: Estimated Creatinine Clearance: 100 mL/min (by C-G formula based on SCr of 0.97 mg/dL). Liver Function Tests: Recent Labs  Lab 03/08/21 0543  AST 14*  ALT 9  ALKPHOS 59  BILITOT 0.4  PROT 6.1*  ALBUMIN 3.1*   No results for input(s): LIPASE, AMYLASE in the last 168 hours. No results for input(s): AMMONIA in the last 168 hours. Coagulation Profile: No results for input(s): INR, PROTIME in the last 168 hours. Cardiac Enzymes: No results for input(s): CKTOTAL, CKMB, CKMBINDEX, TROPONINI in the last 168 hours. BNP (last 3 results) No results for input(s): PROBNP in the last 8760 hours. HbA1C: No results for input(s): HGBA1C in the last 72 hours. CBG: Recent Labs  Lab 03/07/21 2034 03/07/21 2218 03/08/21 0810 03/08/21 1137 03/08/21 1539  GLUCAP 67* 148* 87 103* 95   Lipid Profile: No results for input(s): CHOL, HDL, LDLCALC, TRIG, CHOLHDL, LDLDIRECT in the last 72 hours. Thyroid Function Tests: Recent Labs    03/11/21 0848  FREET4 0.92   Anemia Panel: No results for input(s): VITAMINB12, FOLATE, FERRITIN, TIBC, IRON, RETICCTPCT in the last 72 hours. Sepsis Labs: No results for input(s): PROCALCITON, LATICACIDVEN in the last 168 hours.  Recent Results (from the past 240 hour(s))  Resp Panel by RT-PCR (Flu A&B, Covid) Nasopharyngeal Swab     Status: None   Collection Time: 03/07/21  6:29 PM   Specimen: Nasopharyngeal Swab; Nasopharyngeal(NP) swabs in vial transport medium  Result Value Ref Range Status   SARS Coronavirus 2 by RT PCR NEGATIVE NEGATIVE Final    Comment: (NOTE) SARS-CoV-2 target nucleic acids are NOT DETECTED.  The SARS-CoV-2 RNA is generally detectable in upper respiratory specimens during the acute phase of infection. The lowest concentration of SARS-CoV-2 viral copies this assay can detect is 138 copies/mL. A negative result does not preclude  SARS-Cov-2 infection and should not be used as the sole basis for treatment or other patient management decisions. A negative result may occur with  improper specimen collection/handling, submission of specimen other than nasopharyngeal swab, presence of viral mutation(s) within the areas targeted by this assay, and inadequate number of viral copies(<138 copies/mL). A negative result must be combined with clinical observations, patient history, and epidemiological information. The expected result is Negative.  Fact Sheet for Patients:  BloggerCourse.com  Fact Sheet for Healthcare Providers:  SeriousBroker.it  This test is no t yet approved or cleared by the [place] FDA and  has been authorized for detection and/or diagnosis of SARS-CoV-2 by FDA under an Emergency Use Authorization (EUA). This EUA will remain  in effect (meaning this test can be used) for the duration of the COVID-19 declaration under Section 564(b)(1) of the Act, 21 U.S.C.section 360bbb-3(b)(1), unless the authorization is terminated  or revoked sooner.       Influenza A by PCR NEGATIVE NEGATIVE Final   Influenza B by PCR NEGATIVE NEGATIVE Final  Comment: (NOTE) The Xpert Xpress SARS-CoV-2/FLU/RSV plus assay is intended as an aid in the diagnosis of influenza from Nasopharyngeal swab specimens and should not be used as a sole basis for treatment. Nasal washings and aspirates are unacceptable for Xpert Xpress SARS-CoV-2/FLU/RSV testing.  Fact Sheet for Patients: BloggerCourse.comhttps://www.fda.gov/media/152166/download  Fact Sheet for Healthcare Providers: SeriousBroker.ithttps://www.fda.gov/media/152162/download  This test is not yet approved or cleared by the [place] FDA and has been authorized for detection and/or diagnosis of SARS-CoV-2 by FDA under an Emergency Use Authorization (EUA). This EUA will remain in effect (meaning this test can be used) for the duration of  the COVID-19 declaration under Section 564(b)(1) of the Act, 21 U.S.C. section 360bbb-3(b)(1), unless the authorization is terminated or revoked.  Performed at [facility], [address] [place], [place] [address]          Radiology Studies: MR BRAIN WO CONTRAST  Result Date: 03/10/2021 CLINICAL DATA:  Neuro deficit, acute, stroke suspected. EXAM: MRI HEAD WITHOUT CONTRAST TECHNIQUE: Multiplanar, multiecho pulse sequences of the brain and surrounding structures were obtained without intravenous contrast. COMPARISON:  Head CT March 07, 2021. FINDINGS: Brain: No acute infarction, hemorrhage, hydrocephalus, extra-axial collection or mass lesion. The brain parenchyma has normal morphology and signal characteristics. Mesial temporal lobes are symmetric. 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 Vascular: Normal flow voids. Skull and upper cervical spine: Normal marrow signal. Sinuses/Orbits: Negative. Other: None. IMPRESSION: 1. No acute intracranial abnormality. 2. 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 May be seen the setting of idiopathic intracranial hypertension in the appropriate clinical scenario. Electronically Signed   By: Baldemar LenisKatyucia  De Macedo Rodrigues M.D.   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 Level of alertness: Awake, asleep AEDs during EEG study: LEV, LTG, ZNS, GBP Technical aspects: This EEG study was done with scalp electrodes positioned according to the 10-20 International system of electrode placement. Electrical activity was acquired at a sampling rate of 500Hz  and reviewed with a high frequency filter of  70Hz  and a low frequency filter of 1Hz . EEG data were recorded continuously and digitally stored. Description: The posterior dominant rhythm consists of 9 Hz activity of moderate voltage (25-35 uV) seen predominantly in posterior head regions, symmetric and reactive to eye opening and eye closing. Sleep was characterized by vertex waves, sleep spindles (12 to 14 Hz), maximal frontocentral region. Generalized, maximal left frontal polyspikes were noted.   Physiologic photic driving was not seen during photic stimulation.  Hyperventilation was not performed.   ABNORMALITY -Polyspikes, generalized, maximal left frontal region IMPRESSION: This study is consistent with patient's known history of epilepsy, likely with generalized onset.  However, focal epilepsy arising from frontal region can at times have similar interictal EEG appearance.  No seizures were seen throughout the recording. Priyanka Annabelle Harman Yadav        Scheduled Meds:  ferrous sulfate  325 mg Oral Q breakfast   gabapentin  100 mg Oral TID   lamoTRIgine  100 mg Oral BID   levETIRAcetam  500 mg Oral BID   zonisamide  600 mg Oral QHS   Continuous Infusions:   LOS: 4 days    Time spent: 25  minutes    Tresa Moore, MD [facility] Pager [phone]  If 7PM-7AM, please contact night-coverage [email or web address] Password [facility] 03/11/2021, 4:22 PM

## 2021-03-11 NOTE — Progress Notes (Signed)
Occupational Therapy Treatment Patient Details Name: GRACELAND WACHTER MRN: [id number] DOB: 06/25/1986 Today's Date: 03/11/2021    History of present illness Patient is a 35 y.o. female with medical history significant for seizure disorder, chronic anemia associated with baseline hemoglobin 9.5-11.5, who is admitted to [facility] on 03/07/2021 with breakthrough tonic-clonic seizure with postictal state after presenting from home to [facility] ED via EMS for evaluation. Now with difficulty moving left side, possible Todd's paralysis.   OT comments  Upon entering the room, pt seated in recliner chair with no c/o pain. Pt reports, " I still can't use my left leg". OT encouraged pt to participate in OT intervention. Pt performed bed mobility to EOB with supervision and no physical assistance. Purewick removed and pt reports need for toileting. Pt standing with supervision and transferring to [facility] with min guard. Pt able to void and stands for hygiene with min guard for balance. Pt then reports, "I don't want to fall" and turns RW in tight space to sit in recliner chair with min guard. Pt moves slowly but utilized L UE and LE in very functional manner this session without being cued to do so. Once seated in recliner chair, pt given green, medium resistance theraputty for hand strengthening and able to demonstrate good coordination and strength for exercises. Putty left with pt. Chair alarm activated and pt left seated in recliner chair. Pt continues to benefit from OT intervention.    Follow Up Recommendations  Home health OT;Supervision/Assistance - 24 hour    Equipment Recommendations  3 in 1 bedside commode       Precautions / Restrictions Precautions Precautions: Fall       Mobility Bed Mobility Overal bed mobility: Needs Assistance Bed Mobility: Supine to Sit     Supine to sit: Supervision;HOB elevated     General bed mobility comments: increased time but no physical  assist    Transfers Overall transfer level: Needs assistance Equipment used: Rolling walker (2 wheeled) Transfers: Sit to/from [employer] Sit to Stand: Supervision Stand pivot transfers: Min guard            Balance Overall balance assessment: Needs assistance Sitting-balance support: Feet supported;Bilateral upper extremity supported Sitting balance-Leahy Scale: Good Sitting balance - Comments: use of B UEs for support on EOB   Standing balance support: Bilateral upper extremity supported Standing balance-Leahy Scale: Fair Standing balance comment: support from therapist                           ADL either performed or assessed with clinical judgement   ADL Overall ADL's : Needs assistance/impaired                         Toilet Transfer: Min guard;BSC;RW   Toileting- Clothing Manipulation and Hygiene: Min guard;Sit to/from stand       Functional mobility during ADLs: Min guard;Rolling walker       Vision Patient Visual Report: No change from baseline            Cognition Arousal/Alertness: Awake/alert Behavior During Therapy: WFL for tasks assessed/performed Overall Cognitive Status: Within Functional Limits for tasks assessed                                 General Comments: Pt is pleasant and agreeable.  Pertinent Vitals/ Pain       Pain Assessment: No/denies pain   Frequency  Min 2X/week        Progress Toward Goals  OT Goals(current goals can now be found in the care plan section)  Progress towards OT goals: Progressing toward goals  Acute Rehab OT Goals Patient Stated Goal: regain strength OT Goal Formulation: With patient Time For Goal Achievement: 03/23/21 Potential to Achieve Goals: Fair  Plan Discharge plan remains appropriate       AM-PAC OT "6 Clicks" Daily Activity     Outcome Measure   Help from another person eating meals?: None Help from  another person taking care of personal grooming?: A Little Help from another person toileting, which includes using toliet, bedpan, or urinal?: A Little Help from another person bathing (including washing, rinsing, drying)?: A Little Help from another person to put on and taking off regular upper body clothing?: A Little Help from another person to put on and taking off regular lower body clothing?: A Little 6 Click Score: 19    End of Session Equipment Utilized During Treatment: Rolling walker  OT Visit Diagnosis: Unsteadiness on feet (R26.81);Repeated falls (R29.6);Muscle weakness (generalized) (M62.81)   Activity Tolerance     Patient Left in chair;with call bell/phone within reach   Nurse Communication Mobility status;Precautions        Time: 5537-4827 OT Time Calculation (min): 38 min  Charges: OT General Charges $OT Visit: 1 Visit OT Treatments $Self Care/Home Management : 23-37 mins $Therapeutic Activity: 8-22 mins  Jackquline Denmark, MS, OTR/L , CBIS ascom [phone]  03/11/21, 12:51 PM

## 2021-03-11 NOTE — Progress Notes (Signed)
Patient with elevated HR.  Upon entry to patient's room, patient noted to be rhythmically moving her right arm across her chest, eyes open with head tilted to the left.  Patient would not look or respond to nursing staff.  MD paged and prn ativan given for seizure like activity.  MD assessed patient in room and no new orders given.  Will continue to monitor.

## 2021-03-11 NOTE — Progress Notes (Signed)
PT Cancellation Note  Patient Details Name: Jill [name] MRN: [id number] DOB: 1985-12-18   Cancelled Treatment:    Reason Eval/Treat Not Completed: Other (comment). Pt sleeping soundly in bed. PT to re-attempt as able.  Olga Coaster PT, DPT 3:42 PM,03/11/21

## 2021-03-12 LAB — PROLACTIN: Prolactin: 18.2 ng/mL (ref 4.8–23.3)

## 2021-03-12 LAB — ACTH: C206 ACTH: 20.3 pg/mL (ref 7.2–63.3)

## 2021-03-12 LAB — INSULIN-LIKE GROWTH FACTOR: Somatomedin C: 91 ng/mL (ref 84–281)

## 2021-03-12 MED ORDER — POLYETHYLENE GLYCOL 3350 17 G PO PACK
17.0000 g | PACK | Freq: Every day | ORAL | Status: DC
  Administered 2021-03-12 – 2021-03-13 (×2): 17 g via ORAL
  Filled 2021-03-12 (×2): qty 1

## 2021-03-12 MED ORDER — SENNOSIDES-DOCUSATE SODIUM 8.6-50 MG PO TABS
1.0000 | ORAL_TABLET | Freq: Two times a day (BID) | ORAL | Status: DC
  Administered 2021-03-12 – 2021-03-13 (×3): 1 via ORAL
  Filled 2021-03-12 (×3): qty 1

## 2021-03-12 MED ORDER — BISACODYL 10 MG RE SUPP: 10.0000 mg | Freq: Every day | RECTAL | Status: DC | PRN

## 2021-03-12 MED ORDER — BUTALBITAL-APAP-CAFFEINE 50-325-40 MG PO TABS
1.0000 | ORAL_TABLET | Freq: Four times a day (QID) | ORAL | Status: DC | PRN
  Administered 2021-03-12 – 2021-03-13 (×2): 1 via ORAL
  Filled 2021-03-12 (×2): qty 1

## 2021-03-12 NOTE — Progress Notes (Signed)
Physical Therapy Treatment Patient Details Name: Jill [name] MRN: [id number] DOB: Sep 13, 1986 Today's Date: 03/12/2021    History of Present Illness Patient is a 35 y.o. female with medical history significant for seizure disorder, chronic anemia associated with baseline hemoglobin 9.5-11.5, who is admitted to [facility] on 03/07/2021 with breakthrough tonic-clonic seizure with postictal state after presenting from home to [facility] ED via EMS for evaluation. Now with difficulty moving left side, possible Todd's paralysis.    PT Comments    Patient is making progress with functional independence. Patient ambulated 54ft with stand by assistance, no loss of balance, and without rolling walker. Patient subjectively reports left side weakness, however no knee buckling or focal weakness is noted with observation of functional activity. All movement and mobility is very delayed, however no physical assistance required with mobility at this time. Recommend  to continue PT to maximize independence.    Follow Up Recommendations  Supervision for mobility/OOB;Home health PT     Equipment Recommendations  3in1 (PT)    Recommendations for Other Services       Precautions / Restrictions Precautions Precautions: Fall Restrictions Weight Bearing Restrictions: No    Mobility  Bed Mobility Overal bed mobility: Modified Independent Bed Mobility: Supine to Sit     Supine to sit: HOB elevated;Modified independent (Device/Increase time) Sit to supine: Modified independent (Device/Increase time);HOB elevated   General bed mobility comments: head of bed elevated and use of bed rails for bed mobility. no physical assistance required, however significant extra time needed    Transfers Overall transfer level: Needs assistance Equipment used: None Transfers: Sit to/from Stand Sit to Stand: Supervision         General transfer comment: no physical assistance reuqired for  transfers. mild dizziness reported initially with sitting up that subsided with time. no dizziness reported in standing. cues for task initiation and significant extra time required to complete tasks  Ambulation/Gait Ambulation/Gait assistance:  (stand by assistance) Gait Distance (Feet): 20 Feet Assistive device: None Gait Pattern/deviations: Decreased stride length Gait velocity: decreased   General Gait Details: patient ambulated in room to the door and back to bed with significant extra time. no unsteadiness or loss of balance with ambulation without assistive device. stand by assistance for safety and cues for technique. heart rate in the 120's-130's with walking   Stairs             Wheelchair Mobility    Modified Rankin (Stroke Patients Only)       Balance                                            Cognition Arousal/Alertness: Awake/alert Behavior During Therapy: Flat affect Overall Cognitive Status: Within Functional Limits for tasks assessed                                 General Comments: delay with following commands at times      Exercises      General Comments        Pertinent Vitals/Pain Pain Assessment: No/denies pain    Home Living                      Prior Function            PT Goals (  current goals can now be found in the care plan section) Acute Rehab PT Goals Patient Stated Goal: regain strength PT Goal Formulation: With patient Time For Goal Achievement: 03/22/21 Potential to Achieve Goals: Good Progress towards PT goals: Progressing toward goals    Frequency    7X/week      PT Plan Current plan remains appropriate    Co-evaluation              AM-PAC PT "6 Clicks" Mobility   Outcome Measure  Help needed turning from your back to your side while in a flat bed without using bedrails?: A Little Help needed moving from lying on your back to sitting on the side of a flat  bed without using bedrails?: A Little Help needed moving to and from a bed to a chair (including a wheelchair)?: A Little Help needed standing up from a chair using your arms (e.g., wheelchair or bedside chair)?: A Little Help needed to walk in hospital room?: A Little Help needed climbing 3-5 steps with a railing? : A Little 6 Click Score: 18    End of Session Equipment Utilized During Treatment: Gait belt Activity Tolerance: Patient tolerated treatment well Patient left: in bed;with call bell/phone within reach;with bed alarm set Nurse Communication: Mobility status PT Visit Diagnosis: Muscle weakness (generalized) (M62.81);Difficulty in walking, not elsewhere classified (R26.2)     Time: 9381-8299 PT Time Calculation (min) (ACUTE ONLY): 45 min  Charges:  $Therapeutic Activity: 38-52 mins                     Donna Bernard, PT, MPT    [name] 03/12/2021, 12:39 PM

## 2021-03-12 NOTE — Progress Notes (Signed)
PROGRESS NOTE    Jill [name]  IDP:[id number] DOB: November 24, 1985 DOA: 03/07/2021 PCP: [facility]   Brief Narrative:  35 year old female with history of seizure disorder coming in with seizure and having left-sided weakness since the seizure.  Past medical history of seizures, headache anemia and obesity.  Neurology consulted and patient on several antiepileptic medications.  Per neurology patient has some residual left-sided weakness.  Functional component versus Todd's paresis.  On 6/29 patient had another seizure-like event with right hand shaking and unresponsiveness.  Resolved with administration of Ativan.  Patient has amnesia for event.  She continues to endorse left-sided weakness which she feels is worsening.  Per neurology this is unlikely be a manifestation of Todd's paralysis and is likely functional in nature.   Assessment & Plan:   Principal Problem:   Seizures (HCC) Active Problems:   Obesity, Class III, BMI 40-49.9 (morbid obesity) (HCC)   Acute encephalopathy   Hypoglycemia   Headache   Anemia   Left-sided weakness   Obesity (BMI 35.0-39.9 without comorbidity)   Acute urinary retention   Empty sella (HCC)  Seizure disorder with left-sided weakness Left-sided continues to demonstrate weakness Limited ability to ambulate with physical therapy MRI negative for stroke EEG completed, neurology follow-up Per neurology unlikely to be Todd's paresis Suspect functional component to residual weakness Plan: Continue Keppra 500 twice daily Lamictal 100 mg p.o. 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 ferrous sulfate  Urinary retention Resolved  Morbid obesity BMI 44 This complicates overall care and prognosis   DVT  prophylaxis: SCDs Code Status: Full Family Communication: Mother Victorino Dike [phone] on 6/30 Disposition Plan: Status is: Inpatient  Remains inpatient appropriate because:Inpatient level of care appropriate due to severity of illness  Dispo: The patient is from: Home              Anticipated d/c is to: Home              Patient currently is not medically stable to d/c.   Difficult to place patient No  Breakthrough seizures with residual left-sided weakness.  Residual left-sided weakness appears to be improving.  Anticipated date of discharge 03/13/2021     Level of care: Med-Surg  Consultants:  Neurology, signed off  Procedures:  None  Antimicrobials:  None   Subjective: Seen and examined.  Continues to endorse left-sided weakness  Objective: Vitals:   03/11/21 2054 03/11/21 2331 03/12/21 0440 03/12/21 0922  BP: 108/62 103/71 107/68 95/74  Pulse: (!) 108 (!) 106 (!) 108 96  Resp: 16 20 18 18   Temp: 98.7 F (37.1 C) 98.5 F (36.9 C) 99.5 F (37.5 C) 99.1 F (37.3 C)  TempSrc:  Oral  Oral  SpO2: 99% 99% 97% 99%  Weight:      Height:        Intake/Output Summary (Last 24 hours) at 03/12/2021 1125 Last data filed at 03/11/2021 2054 Gross per 24 hour  Intake 120 ml  Output 600 ml  Net -480 ml   Filed Weights   03/09/21 0500 03/10/21 0449 03/11/21 0500  Weight: 115.6 kg 112.8 kg 113.5 kg    Examination:  General exam: Appears calm and comfortable  Respiratory system: Clear to auscultation. Respiratory effort normal. Cardiovascular system: S1 & S2 heard, RRR. No JVD, murmurs,  rubs, gallops or clicks. No pedal edema. Gastrointestinal system: Abdomen is nondistended, soft and nontender. No organomegaly or masses felt. Normal bowel sounds heard. Central nervous system: Alert and oriented. No focal neurological deficits. Extremities: Symmetric 5 x 5 power. Skin: No rashes, lesions or ulcers Psychiatry: Judgement and insight appear impaired. Mood & affect  flattened.     Data Reviewed: I have personally reviewed following labs and imaging studies  CBC: Recent Labs  Lab 03/07/21 1335 03/08/21 0543  WBC 5.4 4.7  HGB 11.2* 10.4*  HCT 35.9* 33.4*  MCV 78.9* 79.0*  PLT 338 325   Basic Metabolic Panel: Recent Labs  Lab 03/07/21 1335 03/08/21 0543  NA 138 140  K 3.8 3.8  CL 104 106  CO2 29 28  GLUCOSE 91 93  BUN 6 7  CREATININE 1.10* 0.97  CALCIUM 9.0 8.7*  MG 2.3 2.0   GFR: Estimated Creatinine Clearance: 100 mL/min (by C-G formula based on SCr of 0.97 mg/dL). Liver Function Tests: Recent Labs  Lab 03/08/21 0543  AST 14*  ALT 9  ALKPHOS 59  BILITOT 0.4  PROT 6.1*  ALBUMIN 3.1*   No results for input(s): LIPASE, AMYLASE in the last 168 hours. No results for input(s): AMMONIA in the last 168 hours. Coagulation Profile: No results for input(s): INR, PROTIME in the last 168 hours. Cardiac Enzymes: No results for input(s): CKTOTAL, CKMB, CKMBINDEX, TROPONINI in the last 168 hours. BNP (last 3 results) No results for input(s): PROBNP in the last 8760 hours. HbA1C: No results for input(s): HGBA1C in the last 72 hours. CBG: Recent Labs  Lab 03/07/21 2034 03/07/21 2218 03/08/21 0810 03/08/21 1137 03/08/21 1539  GLUCAP 67* 148* 87 103* 95   Lipid Profile: No results for input(s): CHOL, HDL, LDLCALC, TRIG, CHOLHDL, LDLDIRECT in the last 72 hours. Thyroid Function Tests: Recent Labs    03/11/21 0848  FREET4 0.92   Anemia Panel: No results for input(s): VITAMINB12, FOLATE, FERRITIN, TIBC, IRON, RETICCTPCT in the last 72 hours. Sepsis Labs: No results for input(s): PROCALCITON, LATICACIDVEN in the last 168 hours.  Recent Results (from the past 240 hour(s))  Resp Panel by RT-PCR (Flu A&B, Covid) Nasopharyngeal Swab     Status: None   Collection Time: 03/07/21  6:29 PM   Specimen: Nasopharyngeal Swab; Nasopharyngeal(NP) swabs in vial transport medium  Result Value Ref Range Status   SARS Coronavirus 2 by RT  PCR NEGATIVE NEGATIVE Final    Comment: (NOTE) SARS-CoV-2 target nucleic acids are NOT DETECTED.  The SARS-CoV-2 RNA is generally detectable in upper respiratory specimens during the acute phase of infection. The lowest concentration of SARS-CoV-2 viral copies this assay can detect is 138 copies/mL. A negative result does not preclude SARS-Cov-2 infection and should not be used as the sole basis for treatment or other patient management decisions. A negative result may occur with  improper specimen collection/handling, submission of specimen other than nasopharyngeal swab, presence of viral mutation(s) within the areas targeted by this assay, and inadequate number of viral copies(<138 copies/mL). A negative result must be combined with clinical observations, patient history, and epidemiological information. The expected result is Negative.  Fact Sheet for Patients:  BloggerCourse.com  Fact Sheet for Healthcare Providers:  SeriousBroker.it  This test is no t yet approved or cleared by the [place] FDA and  has been authorized for detection and/or diagnosis of SARS-CoV-2 by FDA under an Emergency Use Authorization (EUA). This EUA will remain  in effect (meaning this test can be  used) for the duration of the COVID-19 declaration under Section 564(b)(1) of the Act, 21 U.S.C.section 360bbb-3(b)(1), unless the authorization is terminated  or revoked sooner.       Influenza A by PCR NEGATIVE NEGATIVE Final   Influenza B by PCR NEGATIVE NEGATIVE Final    Comment: (NOTE) The Xpert Xpress SARS-CoV-2/FLU/RSV plus assay is intended as an aid in the diagnosis of influenza from Nasopharyngeal swab specimens and should not be used as a sole basis for treatment. Nasal washings and aspirates are unacceptable for Xpert Xpress SARS-CoV-2/FLU/RSV testing.  Fact Sheet for Patients: BloggerCourse.com  Fact Sheet for  Healthcare Providers: SeriousBroker.it  This test is not yet approved or cleared by the [place] FDA and has been authorized for detection and/or diagnosis of SARS-CoV-2 by FDA under an Emergency Use Authorization (EUA). This EUA will remain in effect (meaning this test can be used) for the duration of the COVID-19 declaration under Section 564(b)(1) of the Act, 21 U.S.C. section 360bbb-3(b)(1), unless the authorization is terminated or revoked.  Performed at [facility], [address] [place], [place] [address]          Radiology Studies: MR BRAIN WO CONTRAST  Result Date: 03/10/2021 CLINICAL DATA:  Neuro deficit, acute, stroke suspected. EXAM: MRI HEAD WITHOUT CONTRAST TECHNIQUE: Multiplanar, multiecho pulse sequences of the brain and surrounding structures were obtained without intravenous contrast. COMPARISON:  Head CT March 07, 2021. FINDINGS: Brain: No acute infarction, hemorrhage, hydrocephalus, extra-axial collection or mass lesion. The brain parenchyma has normal morphology and signal characteristics. Mesial temporal lobes are symmetric. 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 Vascular: Normal flow voids. Skull and upper cervical spine: Normal marrow signal. Sinuses/Orbits: Negative. Other: None. IMPRESSION: 1. No acute intracranial abnormality. 2. 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 May be seen the setting of idiopathic intracranial hypertension in the appropriate clinical scenario. Electronically Signed   By: Baldemar Lenis M.D.   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 Level of alertness: Awake, asleep AEDs during EEG study: LEV, LTG, ZNS, GBP Technical aspects: This EEG study was done with scalp electrodes positioned according to the 10-20 International system of electrode placement. Electrical activity was acquired at a sampling rate of 500Hz  and reviewed with a high frequency filter of 70Hz  and a low frequency filter of 1Hz . EEG data were recorded continuously and digitally stored. Description: The posterior dominant rhythm consists of 9 Hz activity of moderate voltage (25-35 uV) seen predominantly in posterior head regions, symmetric and reactive to eye opening and eye closing. Sleep was characterized by vertex waves, sleep spindles (12 to 14 Hz), maximal frontocentral region. Generalized, maximal left frontal polyspikes were noted.   Physiologic photic driving was not seen during photic stimulation.  Hyperventilation was not performed.   ABNORMALITY -Polyspikes, generalized, maximal left frontal region IMPRESSION: This study is consistent with patient's known history of epilepsy, likely with generalized onset.  However, focal epilepsy arising from frontal region can at times have similar interictal EEG appearance.  No seizures were seen throughout the recording. Priyanka        Scheduled Meds:  ferrous sulfate  325  mg Oral Q breakfast   gabapentin  100 mg Oral TID   lamoTRIgine  100 mg Oral BID   levETIRAcetam  500 mg Oral BID   polyethylene glycol  17 g Oral Daily   senna-docusate  1 tablet Oral BID   zonisamide  600 mg Oral QHS   Continuous Infusions:   LOS: 5 days    Time spent: 25 minutes    Tresa MooreSudheer B Adrianne Shackleton, MD [facility] Pager [phone]  If 7PM-7AM, please contact night-coverage 03/12/2021, 11:25 AM

## 2021-03-12 NOTE — Progress Notes (Signed)
Occupational Therapy Treatment Patient Details Name: JOLEAN MADARIAGA MRN: [id number] DOB: July 28, 1986 Today's Date: 03/12/2021    History of present illness Patient is a 35 y.o. female with medical history significant for seizure disorder, chronic anemia associated with baseline hemoglobin 9.5-11.5, who is admitted to [facility] on 03/07/2021 with breakthrough tonic-clonic seizure with postictal state after presenting from home to [facility] ED via EMS for evaluation. Now with difficulty moving left side, possible Todd's paralysis.   OT comments  Upon entering the room, pt with lunch salad in her lap and utilizing B UEs to cut chicken. Pt needing increased time but able to complete task with encouragement. Pt with smile on her face and much more engaged this session. OT discussed home set up and safety concerns. OT discussed recommendation to use 3 in 1 commode chair for shower transfer for safety. OT heavily discussed sleep hygiene and energy conservation. After education, pt discussed possibility of shower at night and then getting into bed. No further use of technology and instead reading a book before bed. OT encouraged pt to get back into routines for herself at home that work for her and family. OT began to prompt pt that discharge may be soon and pt reports that even if it is tomorrow she believes she will be okay at home with family. Pt continues to benefit from OT intervention. All needs within reach upon exiting the room.    Follow Up Recommendations  Home health OT;Supervision/Assistance - 24 hour    Equipment Recommendations  3 in 1 bedside commode    Recommendations for Other Services      Precautions / Restrictions Precautions Precautions: Fall              ADL either performed or assessed with clinical judgement   ADL   Eating/Feeding: Modified independent Eating/Feeding Details (indicate cue type and reason): increased time                                          Vision Patient Visual Report: No change from baseline            Cognition Arousal/Alertness: Awake/alert Behavior During Therapy: WFL for tasks assessed/performed Overall Cognitive Status: Within Functional Limits for tasks assessed                                                     Pertinent Vitals/ Pain       Pain Assessment: No/denies pain         Frequency  Min 2X/week        Progress Toward Goals  OT Goals(current goals can now be found in the care plan section)  Progress towards OT goals: Progressing toward goals  Acute Rehab OT Goals Patient Stated Goal: regain strength and go home OT Goal Formulation: With patient Time For Goal Achievement: 03/23/21 Potential to Achieve Goals: Fair  Plan Discharge plan remains appropriate    AM-PAC OT "6 Clicks" Daily Activity     Outcome Measure   Help from another person eating meals?: None Help from another person taking care of personal grooming?: A Little Help from another person toileting, which includes using toliet, bedpan, or urinal?: A Little Help from another person bathing (  including washing, rinsing, drying)?: A Little Help from another person to put on and taking off regular upper body clothing?: A Little Help from another person to put on and taking off regular lower body clothing?: A Little 6 Click Score: 19    End of Session    OT Visit Diagnosis: Unsteadiness on feet (R26.81);Repeated falls (R29.6);Muscle weakness (generalized) (M62.81)   Activity Tolerance     Patient Left in bed;with call bell/phone within reach   Nurse Communication Mobility status        Time: 6195-0932 OT Time Calculation (min): 25 min  Charges: OT General Charges $OT Visit: 1 Visit OT Treatments $Therapeutic Activity: 23-37 mins  Jackquline Denmark, MS, OTR/L , CBIS ascom [phone]  03/12/21, 4:21 PM

## 2021-03-13 LAB — GLUCOSE, CAPILLARY: Glucose-Capillary: 91 mg/dL (ref 70–99)

## 2021-03-13 LAB — LEVETIRACETAM LEVEL: Levetiracetam Lvl: 12.5 ug/mL (ref 10.0–40.0)

## 2021-03-13 LAB — PREGNANCY, URINE: Preg Test, Ur: NEGATIVE

## 2021-03-13 MED ORDER — ZONISAMIDE 100 MG PO CAPS: 600.0000 mg | ORAL_CAPSULE | Freq: Every day | ORAL | 0 refills | Status: AC

## 2021-03-13 MED ORDER — SENNOSIDES-DOCUSATE SODIUM 8.6-50 MG PO TABS: 1.0000 | ORAL_TABLET | Freq: Two times a day (BID) | ORAL | 0 refills | Status: AC

## 2021-03-13 NOTE — Care Management Important Message (Signed)
Important Message  Patient Details  Name: Jill [name] MRN: [id number] Date of Birth: 1986-08-17   Medicare Important Message Given:  Yes     Johnell Comings 03/13/2021, 11:32 AM

## 2021-03-13 NOTE — Discharge Summary (Signed)
Physician Discharge Summary  Jill [name] ZOX:[id number] DOB: February 20, 1986 DOA: 03/07/2021  PCP: [facility]  Admit date: 03/07/2021 Discharge date: 03/13/2021  Admitted From: Home Disposition: Home with home health  Recommendations for Outpatient Follow-up:  Follow up with PCP in 1-2 weeks Follow-up with neurology as directed  Home Health: Yes Equipment/Devices: None  Discharge Condition: Stable CODE STATUS: Full Diet recommendation: Regular  Brief/Interim Summary: 35 year old female with history of seizure disorder coming in with seizure and having left-sided weakness since the seizure.  Past medical history of seizures, headache anemia and obesity.  Neurology consulted and patient on several antiepileptic medications.  Per neurology patient has some residual left-sided weakness.  Functional component versus Todd's paresis.   On 6/29 patient had another seizure-like event with right hand shaking and unresponsiveness.  Resolved with administration of Ativan.  Patient has amnesia for event.  She continues to endorse left-sided weakness which she feels is worsening.  Per neurology this is unlikely be a manifestation of Todd's paralysis and is likely functional in nature.  On day of discharge 7/1 patient is hemodynamically stable.  She is able to move her left upper and lower extremity.  Paresis felt to be functional in nature.  Had a lengthy discussion with patient and reviewed the MRI and EEG findings with her.  Answered all questions to patient satisfaction.  Home health arranged and patient discharged in stable condition.  Will follow up with PCP and neurology.   Discharge Diagnoses:  Principal Problem:   Seizures (HCC) Active Problems:   Obesity, Class III, BMI 40-49.9 (morbid obesity) (HCC)   Acute encephalopathy   Hypoglycemia   Headache   Anemia   Left-sided weakness   Obesity (BMI 35.0-39.9 without comorbidity)   Acute urinary retention   Empty  sella (HCC)  Seizure disorder with left-sided weakness Left-sided continues to demonstrate weakness Limited ability to ambulate with physical therapy MRI negative for stroke EEG completed, neurology follow-up Per neurology unlikely to be Todd's paresis Suspect functional component to residual weakness Discharge plan Continue Keppra 500 twice daily Lamictal 100 mg p.o. twice daily Zonegran 600 mg nightly Gabapentin 100 mg 3 times daily Discharged home with home health services   Empty sella syndrome No acute management Likely incidental finding No clinical indicators of hypopituitarism Discussed findings with patient   Headaches As needed treatment   Iron deficiency anemia P.o. ferrous sulfate   Urinary retention Resolved   Morbid obesity BMI 44 This complicates overall care and prognosis  Discharge Instructions  Discharge Instructions     Diet - low sodium heart healthy   Complete by: As directed    Increase activity slowly   Complete by: As directed       Allergies as of 03/13/2021   No Known Allergies      Medication List     STOP taking these medications    ibuprofen 200 MG tablet Commonly known as: Motrin IB       TAKE these medications    furosemide 20 MG tablet Commonly known as: LASIX Take 20 mg by mouth daily.   gabapentin 100 MG capsule Commonly known as: NEURONTIN Take 100 mg by mouth in the morning, at noon, and at bedtime.   lamoTRIgine 25 MG tablet Commonly known as: LAMICTAL Take 25 mg by mouth 2 (two) times daily.   lamoTRIgine 100 MG tablet Commonly known as: LAMICTAL Take 100 mg by mouth 2 (two) times daily.   levETIRAcetam 500 MG tablet Commonly known  as: KEPPRA Take 500 mg by mouth in the morning and at bedtime. What changed: Another medication with the same name was removed. Continue taking this medication, and follow the directions you see here.   ondansetron 4 MG disintegrating tablet Commonly known as: Zofran  ODT Take 1 tablet (4 mg total) by mouth every 8 (eight) hours as needed for nausea or vomiting.   Ortho-Novum 7/7/7 (28) 0.5/0.75/1-35 MG-MCG tablet Generic drug: norethindrone-ethinyl estradiol Take by mouth.   senna-docusate 8.6-50 MG tablet Commonly known as: Senokot-S Take 1 tablet by mouth 2 (two) times daily for 14 days.   SUMAtriptan 100 MG tablet Commonly known as: IMITREX Take 100 mg by mouth daily as needed.   Vitamin D (Ergocalciferol) 1.25 MG (50000 UNIT) Caps capsule Commonly known as: DRISDOL Take 50,000 Units by mouth once a week.   zonisamide 100 MG capsule Commonly known as: ZONEGRAN Take 6 capsules (600 mg total) by mouth at bedtime. What changed:  how much to take when to take this               Durable Medical Equipment  (From admission, onward)           Start     Ordered   03/13/21 1159  For home use only DME Walker rolling  Once       Question Answer Comment  Walker: With 5 Inch Wheels   Patient needs a walker to treat with the following condition Weakness generalized      03/13/21 1158   03/13/21 0945  For home use only DME 3 n 1  Once        03/13/21 0944   03/10/21 1007  For home use only DME 3 n 1  Once        03/10/21 1007            Follow-up Information     [facility] Follow up.   Why: Home Health agency will be calling to arrange your first visit.  If you have questions you can reach out to their office. Contact information: Office: [phone]               No Known Allergies  Consultations: Neurology   Procedures/Studies: CT Head Wo Contrast  Result Date: 03/07/2021 CLINICAL DATA:  Seizure EXAM: CT HEAD WITHOUT CONTRAST TECHNIQUE: Contiguous axial images were obtained from the base of the skull through the vertex without intravenous contrast. COMPARISON:  10/25/2020 FINDINGS: Brain: No evidence of acute infarction, hemorrhage, hydrocephalus, extra-axial collection or mass lesion/mass  effect. Vascular: No hyperdense vessel or unexpected calcification. Skull: Normal. Negative for fracture or focal lesion. Sinuses/Orbits: No acute finding. Other: None. IMPRESSION: No acute intracranial pathology. Electronically Signed   By: Lauralyn Primes M.D.   On: 03/07/2021 13:58   MR BRAIN WO CONTRAST  Result Date: 03/10/2021 CLINICAL DATA:  Neuro deficit, acute, stroke suspected. EXAM: MRI HEAD WITHOUT CONTRAST TECHNIQUE: Multiplanar, multiecho pulse sequences of the brain and surrounding structures were obtained without intravenous contrast. COMPARISON:  Head CT March 07, 2021. FINDINGS: Brain: No acute infarction, hemorrhage, hydrocephalus, extra-axial collection or mass lesion. The brain parenchyma has normal morphology and signal characteristics. Mesial temporal lobes are symmetric. 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 Vascular: Normal flow voids. Skull and upper cervical spine: Normal marrow signal. Sinuses/Orbits: Negative. Other: None. IMPRESSION: 1. No acute intracranial abnormality. 2. 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 May be seen the setting of idiopathic intracranial hypertension in the appropriate  clinical scenario. Electronically Signed   By: Baldemar Lenis M.D.   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 Level of alertness: Awake, asleep AEDs during EEG study: LEV, LTG, ZNS, GBP Technical aspects: This EEG study was done with scalp electrodes positioned according to the 10-20 International system of electrode placement. Electrical activity was acquired at a sampling rate of 500Hz  and reviewed with a high  frequency filter of 70Hz  and a low frequency filter of 1Hz . EEG data were recorded continuously and digitally stored. Description: The posterior dominant rhythm consists of 9 Hz activity of moderate voltage (25-35 uV) seen predominantly in posterior head regions, symmetric and reactive to eye opening and eye closing. Sleep was characterized by vertex waves, sleep spindles (12 to 14 Hz), maximal frontocentral region. Generalized, maximal left frontal polyspikes were noted.   Physiologic photic driving was not seen during photic stimulation.  Hyperventilation was not performed.   ABNORMALITY -Polyspikes, generalized, maximal left frontal region IMPRESSION: This study is consistent with patient's known history of epilepsy, likely with generalized onset.  However, focal epilepsy arising from frontal region can at times have similar interictal EEG appearance.  No seizures were seen throughout the recording. Priyanka   (Echo, Carotid, EGD, Colonoscopy, ERCP)    Subjective: Patient seen and examined on the day of discharge.  Time my evaluation patient is moving her left upper extremity.  Shortly thereafter she endorsed left upper and lower extremity weakness.  Was evaluated by physical and Occupational Therapy.  Was able to move her left upper and lower extremity without issue.  Stable for discharge home.  Discharge Exam: Vitals:   03/13/21 0838 03/13/21 1144  BP: 115/74 103/80  Pulse: 96 (!) 106  Resp: 17 17  Temp: 98.3 F (36.8 C) 97.9 F (36.6 C)  SpO2: 98% 98%   Vitals:   03/13/21 0500 03/13/21 0517 03/13/21 0838 03/13/21 1144  BP:  105/82 115/74 103/80  Pulse:  94 96 (!) 106  Resp:  16 17 17   Temp:  98.1 F (36.7 C) 98.3 F (36.8 C) 97.9 F (36.6 C)  TempSrc:   Oral Oral  SpO2:  98% 98% 98%  Weight: 110.2 kg     Height:        General: Pt is alert, awake, not in acute distress Cardiovascular: RRR, S1/S2 +, no rubs, no gallops Respiratory: CTA bilaterally, no wheezing, no  rhonchi Abdominal: Soft, NT, ND, bowel sounds + Extremities: no edema, no cyanosis    The results of significant diagnostics from this hospitalization (including imaging, microbiology, ancillary and laboratory) are listed below for reference.     Microbiology: Recent Results (from the past 240 hour(s))  Resp Panel by RT-PCR (Flu A&B, Covid) Nasopharyngeal Swab     Status: None   Collection Time: 03/07/21  6:29 PM   Specimen: Nasopharyngeal Swab; Nasopharyngeal(NP) swabs in vial transport medium  Result Value Ref Range Status   SARS Coronavirus 2 by RT PCR NEGATIVE NEGATIVE Final    Comment: (NOTE) SARS-CoV-2 target nucleic acids are NOT DETECTED.  The SARS-CoV-2 RNA is generally detectable in upper respiratory specimens during the acute phase of infection. The lowest  concentration of SARS-CoV-2 viral copies this assay can detect is 138 copies/mL. A negative result does not preclude SARS-Cov-2 infection and should not be used as the sole basis for treatment or other patient management decisions. A negative result may occur with  improper specimen collection/handling, submission of specimen other than nasopharyngeal swab, presence of viral mutation(s) within the areas targeted by this assay, and inadequate number of viral copies(<138 copies/mL). A negative result must be combined with clinical observations, patient history, and epidemiological information. The expected result is Negative.  Fact Sheet for Patients:  BloggerCourse.comhttps://www.fda.gov/media/152166/download  Fact Sheet for Healthcare Providers:  SeriousBroker.ithttps://www.fda.gov/media/152162/download  This test is no t yet approved or cleared by the [place] FDA and  has been authorized for detection and/or diagnosis of SARS-CoV-2 by FDA under an Emergency Use Authorization (EUA). This EUA will remain  in effect (meaning this test can be used) for the duration of the COVID-19 declaration under Section 564(b)(1) of the Act,  21 U.S.C.section 360bbb-3(b)(1), unless the authorization is terminated  or revoked sooner.       Influenza A by PCR NEGATIVE NEGATIVE Final   Influenza B by PCR NEGATIVE NEGATIVE Final    Comment: (NOTE) The Xpert Xpress SARS-CoV-2/FLU/RSV plus assay is intended as an aid in the diagnosis of influenza from Nasopharyngeal swab specimens and should not be used as a sole basis for treatment. Nasal washings and aspirates are unacceptable for Xpert Xpress SARS-CoV-2/FLU/RSV testing.  Fact Sheet for Patients: BloggerCourse.comhttps://www.fda.gov/media/152166/download  Fact Sheet for Healthcare Providers: SeriousBroker.ithttps://www.fda.gov/media/152162/download  This test is not yet approved or cleared by the [place] FDA and has been authorized for detection and/or diagnosis of SARS-CoV-2 by FDA under an Emergency Use Authorization (EUA). This EUA will remain in effect (meaning this test can be used) for the duration of the COVID-19 declaration under Section 564(b)(1) of the Act, 21 U.S.C. section 360bbb-3(b)(1), unless the authorization is terminated or revoked.  Performed at [facility], [address] [place], [place] [address]      Labs: BNP (last 3 results) No results for input(s): BNP in the last 8760 hours. Basic Metabolic Panel: Recent Labs  Lab 03/07/21 1335 03/08/21 0543  NA 138 140  K 3.8 3.8  CL 104 106  CO2 29 28  GLUCOSE 91 93  BUN 6 7  CREATININE 1.10* 0.97  CALCIUM 9.0 8.7*  MG 2.3 2.0   Liver Function Tests: Recent Labs  Lab 03/08/21 0543  AST 14*  ALT 9  ALKPHOS 59  BILITOT 0.4  PROT 6.1*  ALBUMIN 3.1*   No results for input(s): LIPASE, AMYLASE in the last 168 hours. No results for input(s): AMMONIA in the last 168 hours. CBC: Recent Labs  Lab 03/07/21 1335 03/08/21 0543  WBC 5.4 4.7  HGB 11.2* 10.4*  HCT 35.9* 33.4*  MCV 78.9* 79.0*  PLT 338 325   Cardiac Enzymes: No results for input(s): CKTOTAL, CKMB, CKMBINDEX, TROPONINI in the last 168  hours. BNP: Invalid input(s): POCBNP CBG: Recent Labs  Lab 03/07/21 2034 03/07/21 2218 03/08/21 0810 03/08/21 1137 03/08/21 1539  GLUCAP 67* 148* 87 103* 95   D-Dimer No results for input(s): DDIMER in the last 72 hours. Hgb A1c No results for input(s): HGBA1C in the last 72 hours. Lipid Profile No results for input(s): CHOL, HDL, LDLCALC, TRIG, CHOLHDL, LDLDIRECT in the last 72 hours. Thyroid function studies No results for input(s): TSH, T4TOTAL, T3FREE, THYROIDAB in the last 72 hours.  Invalid input(s): FREET3 Anemia work up No results  for input(s): VITAMINB12, FOLATE, FERRITIN, TIBC, IRON, RETICCTPCT in the last 72 hours. Urinalysis    Component Value Date/Time   COLORURINE YELLOW (A) 03/07/2021 1335   APPEARANCEUR HAZY (A) 03/07/2021 1335   APPEARANCEUR Clear 04/20/2014 1426   LABSPEC 1.008 03/07/2021 1335   LABSPEC 1.004 04/20/2014 1426   PHURINE 8.0 03/07/2021 1335   GLUCOSEU NEGATIVE 03/07/2021 1335   GLUCOSEU Negative 04/20/2014 1426   HGBUR SMALL (A) 03/07/2021 1335   BILIRUBINUR NEGATIVE 03/07/2021 1335   BILIRUBINUR Negative 04/20/2014 1426   KETONESUR NEGATIVE 03/07/2021 1335   PROTEINUR NEGATIVE 03/07/2021 1335   NITRITE NEGATIVE 03/07/2021 1335   LEUKOCYTESUR NEGATIVE 03/07/2021 1335   LEUKOCYTESUR 1+ 04/20/2014 1426   Sepsis Labs Invalid input(s): PROCALCITONIN,  WBC,  LACTICIDVEN Microbiology Recent Results (from the past 240 hour(s))  Resp Panel by RT-PCR (Flu A&B, Covid) Nasopharyngeal Swab     Status: None   Collection Time: 03/07/21  6:29 PM   Specimen: Nasopharyngeal Swab; Nasopharyngeal(NP) swabs in vial transport medium  Result Value Ref Range Status   SARS Coronavirus 2 by RT PCR NEGATIVE NEGATIVE Final    Comment: (NOTE) SARS-CoV-2 target nucleic acids are NOT DETECTED.  The SARS-CoV-2 RNA is generally detectable in upper respiratory specimens during the acute phase of infection. The lowest concentration of SARS-CoV-2 viral copies  this assay can detect is 138 copies/mL. A negative result does not preclude SARS-Cov-2 infection and should not be used as the sole basis for treatment or other patient management decisions. A negative result may occur with  improper specimen collection/handling, submission of specimen other than nasopharyngeal swab, presence of viral mutation(s) within the areas targeted by this assay, and inadequate number of viral copies(<138 copies/mL). A negative result must be combined with clinical observations, patient history, and epidemiological information. The expected result is Negative.  Fact Sheet for Patients:  BloggerCourse.com  Fact Sheet for Healthcare Providers:  SeriousBroker.it  This test is no t yet approved or cleared by the [place] FDA and  has been authorized for detection and/or diagnosis of SARS-CoV-2 by FDA under an Emergency Use Authorization (EUA). This EUA will remain  in effect (meaning this test can be used) for the duration of the COVID-19 declaration under Section 564(b)(1) of the Act, 21 U.S.C.section 360bbb-3(b)(1), unless the authorization is terminated  or revoked sooner.       Influenza A by PCR NEGATIVE NEGATIVE Final   Influenza B by PCR NEGATIVE NEGATIVE Final    Comment: (NOTE) The Xpert Xpress SARS-CoV-2/FLU/RSV plus assay is intended as an aid in the diagnosis of influenza from Nasopharyngeal swab specimens and should not be used as a sole basis for treatment. Nasal washings and aspirates are unacceptable for Xpert Xpress SARS-CoV-2/FLU/RSV testing.  Fact Sheet for Patients: BloggerCourse.com  Fact Sheet for Healthcare Providers: SeriousBroker.it  This test is not yet approved or cleared by the [place] FDA and has been authorized for detection and/or diagnosis of SARS-CoV-2 by FDA under an Emergency Use Authorization (EUA). This EUA  will remain in effect (meaning this test can be used) for the duration of the COVID-19 declaration under Section 564(b)(1) of the Act, 21 U.S.C. section 360bbb-3(b)(1), unless the authorization is terminated or revoked.  Performed at [facility], [address], [place], [place] [address]      Time coordinating discharge: Over 30 minutes  SIGNED:   Tresa Moore, MD  [facility] 03/13/2021, 12:59 PM Pager   If 7PM-7AM, please contact night-coverage

## 2021-03-13 NOTE — Progress Notes (Signed)
Pt expressed concern for pregnancy. She was unable to report LMP stating "I don't know" when asked the date of her LMP. Urine pregnancy test order per Andrez Grime, MD. External catheter canister/ equipment replaced. Pt has not produced urine for specimen at this point.

## 2021-03-13 NOTE — TOC Transition Note (Signed)
Transition of Care [facility]) - CM/SW Discharge Note   Patient Details  Name: Jill [name] MRN: [id number] Date of Birth: 28-Apr-1986  [facility] CM/SW Contact:  Allayne Butcher, RN Phone Number: 03/13/2021, 12:25 PM   Clinical Narrative:    Patient medically cleared for discharge home today.  Patient will discharge with [facility].  Barbara Cower with [facility] is aware of discharge today, orders are in for RN, PT, and OT.  Walker and 3 in 1 have both been delivered to the patient's room.    Final next level of care: Home w Home Health Services Barriers to Discharge: Barriers Resolved   Patient Goals and CMS Choice Patient states their goals for this hospitalization and ongoing recovery are:: Wants to get better and get home CMS Medicare.gov Compare Post Acute Care list provided to:: Patient Choice offered to / list presented to : Patient  Discharge Placement                       Discharge Plan and Services   Discharge Planning Services: CM Consult Post Acute Care Choice: Home Health          DME Arranged: 3-N-1, Walker rolling DME Agency: [facility] Date DME Agency Contacted: 03/13/21 Time DME Agency Contacted: 1224 Representative spoke with at DME Agency: Bjorn Loser HH Arranged: PT HH Agency: [facility] Date HH Agency Contacted: 03/13/21 Time HH Agency Contacted: 1224 Representative spoke with at [facility] Agency: Barbara Cower  Social Determinants of Health (SDOH) Interventions     Readmission Risk Interventions No flowsheet data found.

## 2021-03-13 NOTE — Progress Notes (Signed)
Occupational Therapy Treatment Patient Details Name: Jill [name] MRN: [id number] DOB: [birth year] Today's Date: 03/13/2021    History of present illness Patient is a 35 y.o. female with medical history significant for seizure disorder, chronic anemia associated with baseline hemoglobin 9.5-11.5, who is admitted to [facility] on 03/07/2021 with breakthrough tonic-clonic seizure with postictal state after presenting from home to [facility] ED via EMS for evaluation. Now with difficulty moving left side, possible Todd's paralysis.   OT comments  Upon entering the room, pt supine in bed and utilizing phone with B UEs. Pt reports, " My leg ( L) ain't working again". OT encouaged pt for participation. Pt performed supine >sit without assistance but needing increased time and encouragement. Pt declined toileting and encouraged to transfer to recliner chair with plan for discharge today. Pt taking several steps without use of AD to recliner chair. All needs within reach and pt agreeable to sit in recliner chair for lunch. Pt told staff prior to therapist entering that she was unable to use L hand or LE but agrees that she was anxious about discharge plans. Pt agrees that she feels she can safely discharge home and has a smile on her face when therapist exits. Pt very pleasant throughout. All needs within reach and RN notified.   Follow Up Recommendations  Home health OT;Supervision/Assistance - 24 hour    Equipment Recommendations  3 in 1 bedside commode       Precautions / Restrictions Precautions Precautions: Fall       Mobility Bed Mobility Overal bed mobility: Modified Independent Bed Mobility: Supine to Sit     Supine to sit: HOB elevated;Modified independent (Device/Increase time) Sit to supine: Modified independent (Device/Increase time);HOB elevated   General bed mobility comments: Pt reports she is unable to use L LE. Pt able to perform tasks without physical  assist but does need encouragement.    Transfers Overall transfer level: Needs assistance Equipment used: None Transfers: Sit to/from Stand Sit to Stand: Supervision Stand pivot transfers: Supervision       General transfer comment: no physical assist provided. OT providing cuing for safety and encouragement.    Balance Overall balance assessment: Needs assistance Sitting-balance support: Feet supported;Bilateral upper extremity supported Sitting balance-Leahy Scale: Good Sitting balance - Comments: use of B UEs for support on EOB   Standing balance support: Bilateral upper extremity supported Standing balance-Leahy Scale: Fair                             ADL either performed or assessed with clinical judgement     Vision Patient Visual Report: No change from baseline            Cognition Arousal/Alertness: Awake/alert Behavior During Therapy: WFL for tasks assessed/performed Overall Cognitive Status: Within Functional Limits for tasks assessed                                                     Pertinent Vitals/ Pain       Pain Assessment: No/denies pain   Frequency  Min 2X/week        Progress Toward Goals  OT Goals(current goals can now be found in the care plan section)  Progress towards OT goals: Progressing toward goals  Acute Rehab OT Goals Patient  Stated Goal: regain strength and go home OT Goal Formulation: With patient Time For Goal Achievement: 03/23/21 Potential to Achieve Goals: Fair  Plan Discharge plan remains appropriate       AM-PAC OT "6 Clicks" Daily Activity     Outcome Measure   Help from another person eating meals?: None Help from another person taking care of personal grooming?: None Help from another person toileting, which includes using toliet, bedpan, or urinal?: None Help from another person bathing (including washing, rinsing, drying)?: A Little Help from another person to put on and  taking off regular upper body clothing?: None Help from another person to put on and taking off regular lower body clothing?: A Little 6 Click Score: 22    End of Session    OT Visit Diagnosis: Unsteadiness on feet (R26.81);Repeated falls (R29.6);Muscle weakness (generalized) (M62.81)   Activity Tolerance Patient tolerated treatment well   Patient Left in chair;with call bell/phone within reach;with chair alarm set   Nurse Communication Mobility status        Time: 6629-4765 OT Time Calculation (min): 23 min  Charges: OT General Charges $OT Visit: 1 Visit OT Treatments $Therapeutic Activity: 23-37 mins  Jackquline Denmark, MS, OTR/L , CBIS ascom [phone]  03/13/21, 12:44 PM

## 2021-03-13 NOTE — Progress Notes (Signed)
Pulse rechecked [place] protocol not initiated will continue to monitor per unit routine.   03/13/21 0024 03/13/21 0050  Assess: MEWS Score  Temp 98.8 F (37.1 C)  --   BP 112/74  --   Pulse Rate (!) 117 (!) 105  Resp 16  --   SpO2 100 %  --   O2 Device Room Air  --   Assess: MEWS Score  MEWS Temp 0 0  MEWS Systolic 0 0  MEWS Pulse 2 1  MEWS RR 0 0  MEWS LOC 0 0  MEWS Score 2 1  MEWS Score Color Yellow Green  Assess: if the MEWS score is Yellow or Red  Were vital signs taken at a resting state? No  --   Focused Assessment No change from prior assessment  --   Does the patient meet 2 or more of the SIRS criteria? No  --   MEWS guidelines implemented *See Row Information* No, vital signs rechecked (pulse rechecked 0050 pulse of 105)  --   Assess: SIRS CRITERIA  SIRS Temperature  0 0  SIRS Pulse 1 1  SIRS Respirations  0 0  SIRS WBC 0 0  SIRS Score Sum  1 1

## 2021-03-14 NOTE — Progress Notes (Signed)
RN, Deniece Ree, reopen chart to complete documentation from previous shift, Was unable to complete documentation due to lack of encounter access. Pt verbally reported to this RN needed documentation for transcription. Documentation transcription second person verified via Lady Deutscher, Charge RN on staff 03/14/2021.  03/08/21 2101  Assess: MEWS Score  Temp 98.1 F (36.7 C)  BP 98/65  Pulse Rate (!) 106  Resp 18  SpO2 98 %  Assess: MEWS Score  MEWS Temp 0  MEWS Systolic 1  MEWS Pulse 1  MEWS RR 0  MEWS LOC 0  MEWS Score 2  MEWS Score Color Yellow  Assess: if the MEWS score is Yellow or Red  Were vital signs taken at a resting state? Yes  Focused Assessment No change from prior assessment  Does the patient meet 2 or more of the SIRS criteria? No  MEWS guidelines implemented *See Row Information* Yes  Treat  Pain Scale 0-10  Pain Score Asleep  Escalate  MEWS: Escalate Yellow: discuss with charge nurse/RN and consider discussing with provider and RRT  Notify: Charge Nurse/RN  Name of Charge Nurse/RN Notified Richelle Ito, RN  Date Charge Nurse/RN Notified 03/08/21  Time Charge Nurse/RN Notified 2005  Document  Patient Outcome Other (Comment) (continue to monitor)  Progress note created (see row info) Yes  Assess: SIRS CRITERIA  SIRS Temperature  0  SIRS Pulse 1  SIRS Respirations  0  SIRS WBC 0  SIRS Score Sum  1

## 2021-03-19 LAB — SULFONYLUREA HYPOGLYCEMICS PANEL, SERUM
Acetohexamide: NEGATIVE ug/mL (ref 20–60)
Chlorpropamide: NEGATIVE ug/mL (ref 75–250)
Glimepiride: NEGATIVE ng/mL (ref 80–250)
Glipizide: NEGATIVE ng/mL (ref 200–1000)
Glyburide: NEGATIVE ng/mL
Nateglinide: NEGATIVE ng/mL
Repaglinide: NEGATIVE ng/mL
Tolazamide: NEGATIVE ug/mL
Tolbutamide: NEGATIVE ug/mL (ref 40–100)

## 2021-11-06 ENCOUNTER — Other Ambulatory Visit: Payer: Self-pay | Admitting: Nurse Practitioner

## 2021-11-06 DIAGNOSIS — M25562 Pain in left knee: Secondary | ICD-10-CM

## 2021-11-10 ENCOUNTER — Other Ambulatory Visit: Payer: Self-pay | Admitting: Surgery

## 2021-11-10 DIAGNOSIS — K420 Umbilical hernia with obstruction, without gangrene: Secondary | ICD-10-CM

## 2021-11-13 ENCOUNTER — Ambulatory Visit
Admission: RE | Admit: 2021-11-13 | Discharge: 2021-11-13 | Disposition: A | Discharge status: Home / Self Care | Payer: Medicare Other | Source: Ambulatory Visit | Attending: Surgery | Admitting: Surgery

## 2021-11-13 ENCOUNTER — Other Ambulatory Visit: Payer: Self-pay

## 2021-11-13 DIAGNOSIS — K420 Umbilical hernia with obstruction, without gangrene: Secondary | ICD-10-CM | POA: Insufficient documentation

## 2021-11-13 MED ORDER — IOHEXOL 300 MG/ML  SOLN
100.0000 mL | Freq: Once | INTRAMUSCULAR | Status: AC | PRN
  Administered 2021-11-13: 100 mL via INTRAVENOUS

## 2021-12-10 ENCOUNTER — Encounter (HOSPITAL_COMMUNITY): Payer: Self-pay | Admitting: Radiology

## 2022-03-09 ENCOUNTER — Other Ambulatory Visit: Payer: Self-pay | Admitting: Student

## 2022-03-09 DIAGNOSIS — R569 Unspecified convulsions: Secondary | ICD-10-CM

## 2022-03-09 DIAGNOSIS — M542 Cervicalgia: Secondary | ICD-10-CM

## 2022-03-24 ENCOUNTER — Ambulatory Visit
Admission: RE | Admit: 2022-03-24 | Discharge: 2022-03-24 | Disposition: A | Discharge status: Home / Self Care | Payer: Medicare Other | Source: Ambulatory Visit | Attending: Student | Admitting: Student

## 2022-03-24 DIAGNOSIS — R569 Unspecified convulsions: Secondary | ICD-10-CM | POA: Diagnosis present

## 2022-03-24 DIAGNOSIS — M542 Cervicalgia: Secondary | ICD-10-CM | POA: Diagnosis present

## 2023-03-09 ENCOUNTER — Other Ambulatory Visit
Admission: RE | Admit: 2023-03-09 | Discharge: 2023-03-09 | Disposition: A | Discharge status: Home / Self Care | Payer: Medicare HMO | Source: Ambulatory Visit | Attending: Family Medicine | Admitting: Family Medicine

## 2023-03-09 DIAGNOSIS — R0609 Other forms of dyspnea: Secondary | ICD-10-CM | POA: Diagnosis present

## 2023-03-09 DIAGNOSIS — M7989 Other specified soft tissue disorders: Secondary | ICD-10-CM | POA: Diagnosis not present

## 2023-03-09 LAB — BRAIN NATRIURETIC PEPTIDE: B Natriuretic Peptide: 14.8 pg/mL (ref 0.0–100.0)

## 2023-03-09 LAB — D-DIMER, QUANTITATIVE: D-Dimer, Quant: 0.64 ug/mL-FEU — ABNORMAL HIGH (ref 0.00–0.50)

## 2023-03-10 ENCOUNTER — Emergency Department: Payer: Medicare HMO

## 2023-03-10 ENCOUNTER — Encounter: Payer: Self-pay | Admitting: Emergency Medicine

## 2023-03-10 ENCOUNTER — Other Ambulatory Visit: Payer: Self-pay

## 2023-03-10 ENCOUNTER — Emergency Department
Admission: EM | Admit: 2023-03-10 | Discharge: 2023-03-10 | Disposition: A | Discharge status: Home / Self Care | Payer: Medicare HMO | Attending: Emergency Medicine | Admitting: Emergency Medicine

## 2023-03-10 DIAGNOSIS — R0602 Shortness of breath: Secondary | ICD-10-CM | POA: Insufficient documentation

## 2023-03-10 DIAGNOSIS — N189 Chronic kidney disease, unspecified: Secondary | ICD-10-CM | POA: Diagnosis not present

## 2023-03-10 DIAGNOSIS — S0990XA Unspecified injury of head, initial encounter: Secondary | ICD-10-CM | POA: Diagnosis not present

## 2023-03-10 DIAGNOSIS — R079 Chest pain, unspecified: Secondary | ICD-10-CM | POA: Insufficient documentation

## 2023-03-10 DIAGNOSIS — R600 Localized edema: Secondary | ICD-10-CM

## 2023-03-10 DIAGNOSIS — X58XXXA Exposure to other specified factors, initial encounter: Secondary | ICD-10-CM | POA: Diagnosis not present

## 2023-03-10 DIAGNOSIS — R609 Edema, unspecified: Secondary | ICD-10-CM | POA: Insufficient documentation

## 2023-03-10 LAB — CBC
HCT: 33.7 % — ABNORMAL LOW (ref 36.0–46.0)
Hemoglobin: 10.2 g/dL — ABNORMAL LOW (ref 12.0–15.0)
MCH: 24.6 pg — ABNORMAL LOW (ref 26.0–34.0)
MCHC: 30.3 g/dL (ref 30.0–36.0)
MCV: 81.2 fL (ref 80.0–100.0)
Platelets: 362 10*3/uL (ref 150–400)
RBC: 4.15 MIL/uL (ref 3.87–5.11)
RDW: 14.6 % (ref 11.5–15.5)
WBC: 5.6 10*3/uL (ref 4.0–10.5)
nRBC: 0 % (ref 0.0–0.2)

## 2023-03-10 LAB — BASIC METABOLIC PANEL
Anion gap: 6 (ref 5–15)
BUN: 6 mg/dL (ref 6–20)
CO2: 26 mmol/L (ref 22–32)
Calcium: 9 mg/dL (ref 8.9–10.3)
Chloride: 104 mmol/L (ref 98–111)
Creatinine, Ser: 1.08 mg/dL — ABNORMAL HIGH (ref 0.44–1.00)
GFR, Estimated: >60 mL/min (ref >60)
Glucose, Bld: 133 mg/dL — ABNORMAL HIGH (ref 70–99)
Potassium: 3.4 mmol/L — ABNORMAL LOW (ref 3.5–5.1)
Sodium: 136 mmol/L (ref 135–145)

## 2023-03-10 LAB — HCG, QUANTITATIVE, PREGNANCY: hCG, Beta Chain, Quant, S: <1 m[IU]/mL (ref <5)

## 2023-03-10 LAB — TROPONIN I (HIGH SENSITIVITY): Troponin I (High Sensitivity): <2 ng/L (ref <18)

## 2023-03-10 MED ORDER — MORPHINE SULFATE (PF) 4 MG/ML IV SOLN
4.0000 mg | Freq: Once | INTRAVENOUS | Status: AC
  Administered 2023-03-10: 4 mg via INTRAVENOUS
  Filled 2023-03-10: qty 1

## 2023-03-10 MED ORDER — IOHEXOL 350 MG/ML SOLN
75.0000 mL | Freq: Once | INTRAVENOUS | Status: AC | PRN
  Administered 2023-03-10: 75 mL via INTRAVENOUS

## 2023-03-10 MED ORDER — ACETAMINOPHEN 500 MG PO TABS
1000.0000 mg | ORAL_TABLET | Freq: Once | ORAL | Status: AC
  Administered 2023-03-10: 1000 mg via ORAL
  Filled 2023-03-10: qty 2

## 2023-03-10 MED ORDER — LACTATED RINGERS IV BOLUS
1000.0000 mL | Freq: Once | INTRAVENOUS | Status: AC
  Administered 2023-03-10: 1000 mL via INTRAVENOUS

## 2023-03-10 NOTE — ED Provider Notes (Signed)
I received signout this patient.  While going to the bathroom she did slip and fall and hit her head.  She actually lost consciousness briefly.  She is a headache and neck pain.  CT of the head and neck ordered.  Remainder of testing per Dr. Larinda Buttery, I am following up on remaining imaging results.  -- CT of the head neck and remaining imaging results negative.  Discharge and PMD follow-up   Pilar Jarvis, MD 03/10/23 1900

## 2023-03-10 NOTE — ED Provider Notes (Signed)
[facility] Provider Note    Event Date/Time   First MD Initiated Contact with Patient 03/10/23 1248     (approximate)   History   Chief Complaint Shortness of Breath   HPI  Jill [name] is a 37 y.o. female with past medical history of CKD, seizures, and migraines who presents to the ED complaining of shortness of breath.  Patient reports that for about the past week and a half she has been dealing with some difficulty breathing as well as discomfort underneath her left breast.  She describes it as a sharp pain that is not exacerbated or alleviated by anything or particular.  She denies any associated fevers and cough, does report that both of her legs have been swollen and tender to touch.  She has not noticed any redness to her legs and denies any recent injury.  She was initially evaluated at her PCPs office, referred to the ED when she was found to have an elevated D-dimer.  She denies any history of DVT/PE, has not had any recent long travel and does not take any OCPs.     Physical Exam   Triage Vital Signs: ED Triage Vitals  Enc Vitals Group     BP 03/10/23 1017 124/76     Pulse Rate 03/10/23 1017 (!) 112     Resp 03/10/23 1017 18     Temp 03/10/23 1017 98.7 F (37.1 C)     Temp Source 03/10/23 1017 Oral     SpO2 03/10/23 1017 98 %     Weight 03/10/23 1019 178 lb (80.7 kg)     Height 03/10/23 1019 5\' 3"  (1.6 m)     Head Circumference --      Peak Flow --      Pain Score 03/10/23 1018 10     Pain Loc --      Pain Edu? --      Excl. in GC? --     Most recent vital signs: Vitals:   03/10/23 1017 03/10/23 1427  BP: 124/76 120/80  Pulse: (!) 112 100  Resp: 18 18  Temp: 98.7 F (37.1 C) 98 F (36.7 C)  SpO2: 98% 98%    Constitutional: Alert and oriented. Eyes: Conjunctivae are normal. Head: Atraumatic. Nose: No congestion/rhinnorhea. Mouth/Throat: Mucous membranes are moist.  Cardiovascular: Tachycardic, regular rhythm.  Grossly normal heart sounds.  2+ radial and DP pulses bilaterally. Respiratory: Normal respiratory effort.  No retractions. Lungs CTAB. Gastrointestinal: Soft and nontender. No distention. Musculoskeletal: Diffuse tenderness to palpation of bilateral calves with trace pitting edema. Neurologic:  Normal speech and language. No gross focal neurologic deficits are appreciated.    ED Results / Procedures / Treatments   Labs (all labs ordered are listed, but only abnormal results are displayed) Labs Reviewed  BASIC METABOLIC PANEL - Abnormal; Notable for the following components:      Result Value   Potassium 3.4 (*)    Glucose, Bld 133 (*)    Creatinine, Ser 1.08 (*)    All other components within normal limits  CBC - Abnormal; Notable for the following components:   Hemoglobin 10.2 (*)    HCT 33.7 (*)    MCH 24.6 (*)    All other components within normal limits  HCG, QUANTITATIVE, PREGNANCY  TROPONIN I (HIGH SENSITIVITY)     EKG  ED ECG REPORT I, Chesley Noon, the attending physician, personally viewed and interpreted this ECG.   Date: 03/10/2023  EKG Time:  10:22  Rate: 112  Rhythm: sinus tachycardia  Axis: Normal  Intervals:none  ST&T Change: Inferior T wave inversions  RADIOLOGY Chest x-ray reviewed and interpreted by me with no infiltrate, edema, or effusion.  PROCEDURES:  Critical Care performed: No  Procedures   MEDICATIONS ORDERED IN ED: Medications  morphine (PF) 4 MG/ML injection 4 mg (4 mg Intravenous Given 03/10/23 1419)  lactated ringers bolus 1,000 mL (1,000 mLs Intravenous New Bag/Given 03/10/23 1418)  iohexol (OMNIPAQUE) 350 MG/ML injection 75 mL (75 mLs Intravenous Contrast Given 03/10/23 1348)     IMPRESSION / MDM / ASSESSMENT AND PLAN / ED COURSE  I reviewed the triage vital signs and the nursing notes.                              37 y.o. female with past medical history of hyperlipidemia, diabetes, and migraines who presents to the ED  complaining of 1 and half weeks of sharp pain under her left breast associated with some difficulty breathing, leg swelling and pain.  Patient's presentation is most consistent with acute presentation with potential threat to life or bodily function.  Differential diagnosis includes, but is not limited to, ACS, PE, pneumonia, pneumothorax, anemia, lecture abnormality, AKI, GERD, musculoskeletal pain.  Patient nontoxic-appearing and in no acute distress, vital signs remarkable for tachycardia but otherwise reassuring.  EKG shows sinus tachycardia with inferior T wave inversions, no other ischemic changes noted.  Troponin within normal limits and I doubt ACS, will further assess with CTA of chest for PE.  Pregnancy testing is negative, additional labs are reassuring with no significant anemia, leukocytosis, electrode abnormality, or AKI.  Given her calf swelling and tenderness, will also check ultrasound for evidence of DVT.  She remains neurovascular intact to her bilateral lower extremities with no signs of infectious process.  CTA chest negative for PE or other acute process.  DVT ultrasound results pending at this time, patient care to be turned over to oncoming rider pending ultrasound results and reassessment.      FINAL CLINICAL IMPRESSION(S) / ED DIAGNOSES   Final diagnoses:  Chest pain, unspecified type  Peripheral edema     Rx / DC Orders   ED Discharge Orders     None        Note:  This document was prepared using Dragon voice recognition software and may include unintentional dictation errors.   Chesley Noon, MD 03/10/23 [phone]

## 2023-03-10 NOTE — ED Notes (Signed)
Pt reports she fell in the bathroom  Was able to ambulate back to room  Then she pulled call light to inform this nurse that she fell  Hitting her head  No LOC  Complaining of headache  Dr Modesto Charon aware new orders recieved

## 2023-03-10 NOTE — ED Triage Notes (Signed)
Patient sent to ED via [facility] from [facility] after receiving elevated d-dimer results from blood draw yesterday. Patient states she has had SOB x1.5 weeks with bilateral leg swelling. Currently have chest pain under left breast states it has also been going on for 1.5 weeks.

## 2023-03-10 NOTE — Discharge Instructions (Addendum)
Your testing in the emergency showed no emergency conditions like blood clots in your legs or blood clot in your lung.  It is important that you follow-up with your primary doctor this week for reevaluation.  Continue taking your Lasix at home as prescribed.  Take acetaminophen 650 mg and ibuprofen 400 mg every 6 hours for pain related to your headache.

## 2023-08-17 ENCOUNTER — Other Ambulatory Visit: Payer: Self-pay | Admitting: Physician Assistant

## 2023-08-17 DIAGNOSIS — N939 Abnormal uterine and vaginal bleeding, unspecified: Secondary | ICD-10-CM

## 2023-08-30 ENCOUNTER — Ambulatory Visit: Admission: RE | Admit: 2023-08-30 | Payer: Medicare HMO | Source: Ambulatory Visit

## 2024-02-24 ENCOUNTER — Ambulatory Visit: Admission: EM | Admit: 2024-02-24 | Discharge: 2024-02-24 | Disposition: A | Discharge status: Home / Self Care

## 2024-02-24 ENCOUNTER — Encounter: Payer: Self-pay | Admitting: Emergency Medicine

## 2024-02-24 DIAGNOSIS — H5712 Ocular pain, left eye: Secondary | ICD-10-CM

## 2024-02-24 NOTE — ED Triage Notes (Signed)
 Pt c/o left eye pain, itching, watering, and closing. Started a couple of months ago. She states she has tried eye drops without relief.

## 2024-02-24 NOTE — Discharge Instructions (Addendum)
 Please go to your eye doctor at [facility] center or ER today for further evaluation of left eye pain Wear your eyeglasses as you may be straining your eyes

## 2024-02-24 NOTE — ED Provider Notes (Signed)
 [facility]    CSN: [id number] Arrival date & time: 02/24/24  1204      History   Chief Complaint Chief Complaint  Patient presents with   Eye Problem    left    HPI Jill [name] is a 38 y.o. female.   38 year old female, Jill [name], presents to urgent care for evaluation of left eye pain x several months. Pt states she has itching,watering to left eye with it feeling like it was closing. Pt reports trying OTC eye drops without relief.  Pt states she is supposed to wear corrective lens but doesn't have them. Pt denies new meds, makeup,lotions, no eye trauma or injury. Pt denies any photophobia.   PMH: Seizures,MHA,anemia  The history is provided by the patient. No language interpreter was used.    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   Convulsion (HCC) 11/19/2019   Umbilical hernia without obstruction and without gangrene    Ruptured ectopic pregnancy with bilateral salpingectomy 04/24/18 & 10/07/18 10/07/2018   History of ectopic pregnancy with bilateral salpingectomy 04/24/18 & 10/07/18 09/23/2018   Seizure (HCC)--Keppra  09/14/2018   History of absence seizures 04/25/2018   Chronic kidney disease    Episode of loss of consciousness    Anemia during pregnancy 07/14/2016   Trichomoniasis 05/19/2016   Hx of migraines 04/05/2016   History of cesarean delivery x5 04/05/2016    Past Surgical History:  Procedure Laterality Date   CESAREAN SECTION     x4   CESAREAN SECTION N/A 10/07/2016   Procedure: CESAREAN  SECTION;  Surgeon: Carolynn Citrin, MD;  Location: [facility] ORS;  Service: Obstetrics;  Laterality: N/A;   DIAGNOSTIC LAPAROSCOPY WITH REMOVAL OF ECTOPIC PREGNANCY Left 10/07/2018   Procedure: laparoscopic left salpingectomy;  Surgeon: Teresa Fender, MD;  Location: [facility] ORS;  Service: Gynecology;  Laterality: Left;   HERNIA REPAIR     umbilical x 2   UMBILICAL HERNIA REPAIR N/A 10/02/2019   Procedure: HERNIA REPAIR UMBILICAL ADULT;  Surgeon: Emmalene Hare, MD;  Location: [facility] ORS;  Service: General;  Laterality: N/A;    OB History     Gravida  7   Para  4   Term  4   Preterm      AB  2   Living  4      SAB      IAB      Ectopic  1   Multiple      Live Births  4        Obstetric Comments  G6 - ruptured ectopic s/p left salpingectomy  Menstrual age: 29 Age 1st Pregnancy: 18           Home Medications    Prior to Admission medications   Medication Sig Start Date End Date Taking? Authorizing Provider  lamoTRIgine  (LAMICTAL ) 100 MG tablet Take 100 mg by mouth 2 (two) times daily. 03/03/21 02/24/24 Yes [provider]  lamoTRIgine  (LAMICTAL ) 25 MG  tablet Take 25 mg by mouth 2 (two) times daily. 02/20/21 02/24/24 Yes [provider]  zonisamide  (ZONEGRAN ) 100 MG capsule Take 6 capsules (600 mg total) by mouth at bedtime. 03/13/21 02/24/24 Yes Sreenath, Sudheer B, MD  furosemide  (LASIX ) 20 MG tablet Take 20 mg by mouth daily. 02/11/21   [provider]  levETIRAcetam  (KEPPRA ) 500 MG tablet Take 500 mg by mouth in the morning and at bedtime. 10/10/18 03/11/21  [provider]  ondansetron  (ZOFRAN  ODT) 4 MG disintegrating tablet Take 1 tablet (4 mg total) by mouth every 8 (eight) hours as needed for nausea or vomiting. 01/24/20   Jacquie Maudlin, MD  ORTHO-NOVUM 7/7/7, 28, 0.5/0.75/1-35 MG-MCG tablet Take by mouth. 12/01/20   [provider]  SUMAtriptan  (IMITREX ) 100 MG tablet Take 100 mg by mouth daily as needed. 04/08/20    [provider]  Vitamin D, Ergocalciferol, (DRISDOL) 1.25 MG (50000 UNIT) CAPS capsule Take 50,000 Units by mouth once a week. 01/15/20   [provider]    Family History Family History  Problem Relation Age of Onset   Healthy Mother    Anemia Mother    Hypertension Mother    Fibroids Mother    Healthy Father    Learning disabilities Daughter    Cancer Maternal Grandmother    Thyroid disease Neg Hx    Diabetes Neg Hx     Social History Social History   Tobacco Use   Smoking status: Never   Smokeless tobacco: Never  Vaping Use   Vaping status: Never Used  Substance Use Topics   Alcohol use: No   Drug use: No     Allergies   Patient has no known allergies.   Review of Systems Review of Systems  Constitutional:  Negative for fever.  Eyes:  Positive for pain and itching. Negative for photophobia, discharge, redness and visual disturbance.  Skin:  Negative for color change, pallor, rash and wound.  All other systems reviewed and are negative.    Physical Exam Triage Vital Signs ED Triage Vitals  Encounter Vitals Group     BP 02/24/24 1229 122/77     Girls Systolic BP Percentile --      Girls Diastolic BP Percentile --      Boys Systolic BP Percentile --      Boys Diastolic BP Percentile --      Pulse Rate 02/24/24 1229 94     Resp 02/24/24 1229 16     Temp 02/24/24 1229 98.7 F (37.1 C)     Temp Source 02/24/24 1229 Oral     SpO2 02/24/24 1229 100 %     Weight --      Height --      Head Circumference --      Peak Flow --      Pain Score 02/24/24 1226 10     Pain Loc --      Pain Education --      Exclude from Growth Chart --    No data found.  Updated Vital Signs BP 122/77 (BP Location: Left Arm)   Pulse 94   Temp 98.7 F (37.1 C) (Oral)   Resp 16   LMP 01/21/2024 (Approximate)   SpO2 100%   Visual Acuity Right Eye Distance: 20/200 uncorrected but usually wears glasses Left Eye Distance: 20/50 uncorrected but usually  wears glasses Bilateral Distance: 20/70 uncorrected but usually wears glasses  Right Eye Near:   Left Eye Near:    Bilateral  Near:     Physical Exam Vitals and nursing note reviewed.  Constitutional:      General: She is not in acute distress.    Appearance: She is well-developed and well-groomed.  HENT:     Head: Normocephalic and atraumatic.   Eyes:     General: Lids are normal. Vision grossly intact.     Extraocular Movements: Extraocular movements intact.     Left eye: Normal extraocular motion and no nystagmus.     Conjunctiva/sclera: Conjunctivae normal.     Left eye: Left conjunctiva is not injected. No chemosis, exudate or hemorrhage.    Pupils: Pupils are equal, round, and reactive to light.     Slit lamp exam:    Left eye: No photophobia.     Comments: OD 20/200 OS 20/50(affected eye) OU 20/70   Cardiovascular:     Rate and Rhythm: Normal rate and regular rhythm.     Heart sounds: No murmur heard. Pulmonary:     Effort: Pulmonary effort is normal. No respiratory distress.     Breath sounds: Normal breath sounds.  Abdominal:     Palpations: Abdomen is soft.     Tenderness: There is no abdominal tenderness.   Musculoskeletal:        General: No swelling.     Cervical back: Neck supple.   Skin:    General: Skin is warm and dry.     Capillary Refill: Capillary refill takes less than 2 seconds.   Neurological:     Mental Status: She is alert.   Psychiatric:        Mood and Affect: Mood normal.        Behavior: Behavior is cooperative.      UC Treatments / Results  Labs (all labs ordered are listed, but only abnormal results are displayed) Labs Reviewed - No data to display  EKG   Radiology No results found.  Procedures Procedures (including critical care time)  Medications Ordered in UC Medications - No data to display  Initial Impression / Assessment and Plan / UC Course  I have reviewed the triage vital signs and the nursing  notes.  Pertinent labs & imaging results that were available during my care of the patient were reviewed by me and considered in my medical decision making (see chart for details).    Discussed exam findings and plan of care with patient, patient referred back to her eye doctor at [facility] or emergency room( if unable to get in with her eye doctor and feel as though symptoms are worsening or loss of vision) for further evaluation as this has been going on for months, no eye entrapment, no periorbital swelling- Encouraged patient to wear her eyeglasses as this could be causing eyestrain/ part of the problem.  Patient verbalized understanding to this provider  Ddx: Left eye pain, hordeolum,stye, allergies Final Clinical Impressions(s) / UC Diagnoses   Final diagnoses:  Left eye pain     Discharge Instructions      Please go to your eye doctor at [facility] center or ER today for further evaluation of left eye pain Wear your eyeglasses as you may be straining your eyes   ED Prescriptions   None    PDMP not reviewed this encounter.   Peter Brands, NP 02/24/24 1729
